# Patient Record
Sex: Male | Born: 1960 | Race: White | Hispanic: No | Marital: Single | Smoking: Never smoker
Health system: Southern US, Community
[De-identification: ages and names within clinical notes are randomized; demographics above are authoritative.]

## PROBLEM LIST (undated history)

## (undated) DIAGNOSIS — E785 Hyperlipidemia, unspecified: Secondary | ICD-10-CM

## (undated) DIAGNOSIS — M1990 Unspecified osteoarthritis, unspecified site: Secondary | ICD-10-CM

## (undated) DIAGNOSIS — R112 Nausea with vomiting, unspecified: Secondary | ICD-10-CM

## (undated) DIAGNOSIS — R609 Edema, unspecified: Secondary | ICD-10-CM

## (undated) DIAGNOSIS — T8859XA Other complications of anesthesia, initial encounter: Secondary | ICD-10-CM

## (undated) DIAGNOSIS — T4145XA Adverse effect of unspecified anesthetic, initial encounter: Secondary | ICD-10-CM

## (undated) DIAGNOSIS — E119 Type 2 diabetes mellitus without complications: Secondary | ICD-10-CM

## (undated) DIAGNOSIS — D649 Anemia, unspecified: Secondary | ICD-10-CM

## (undated) DIAGNOSIS — I10 Essential (primary) hypertension: Secondary | ICD-10-CM

## (undated) DIAGNOSIS — Z9889 Other specified postprocedural states: Secondary | ICD-10-CM

## (undated) DIAGNOSIS — K219 Gastro-esophageal reflux disease without esophagitis: Secondary | ICD-10-CM

## (undated) DIAGNOSIS — N189 Chronic kidney disease, unspecified: Secondary | ICD-10-CM

## (undated) DIAGNOSIS — Z8719 Personal history of other diseases of the digestive system: Secondary | ICD-10-CM

## (undated) HISTORY — PX: ACHILLES TENDON REPAIR: SUR1153

## (undated) HISTORY — PX: NECK SURGERY: SHX720

## (undated) HISTORY — PX: CHOLECYSTECTOMY: SHX55

---

## 2005-09-03 ENCOUNTER — Other Ambulatory Visit: Payer: Self-pay

## 2005-09-14 ENCOUNTER — Ambulatory Visit: Payer: Self-pay | Admitting: Podiatry

## 2008-10-30 ENCOUNTER — Ambulatory Visit: Payer: Self-pay | Admitting: Podiatry

## 2008-11-22 ENCOUNTER — Encounter: Payer: Self-pay | Admitting: Internal Medicine

## 2008-12-10 ENCOUNTER — Encounter: Payer: Self-pay | Admitting: Internal Medicine

## 2009-01-07 ENCOUNTER — Encounter: Payer: Self-pay | Admitting: Internal Medicine

## 2009-02-07 ENCOUNTER — Encounter: Payer: Self-pay | Admitting: Internal Medicine

## 2009-07-17 ENCOUNTER — Emergency Department: Payer: Self-pay | Admitting: Emergency Medicine

## 2011-02-10 ENCOUNTER — Encounter: Payer: Self-pay | Admitting: Nurse Practitioner

## 2011-02-11 ENCOUNTER — Other Ambulatory Visit: Payer: Self-pay | Admitting: Nurse Practitioner

## 2011-03-10 ENCOUNTER — Encounter: Payer: Self-pay | Admitting: Nurse Practitioner

## 2014-09-12 ENCOUNTER — Encounter: Payer: Self-pay | Admitting: Surgery

## 2014-09-17 LAB — WOUND AEROBIC CULTURE

## 2014-10-09 ENCOUNTER — Encounter: Payer: Self-pay | Admitting: Surgery

## 2014-10-21 LAB — WOUND AEROBIC CULTURE

## 2014-11-09 ENCOUNTER — Encounter: Payer: Self-pay | Admitting: Surgery

## 2014-12-02 LAB — WOUND AEROBIC CULTURE

## 2014-12-10 ENCOUNTER — Encounter: Payer: Self-pay | Admitting: Surgery

## 2015-01-08 ENCOUNTER — Encounter
Admit: 2015-01-08 | Disposition: A | Discharge status: Home / Self Care | Payer: Self-pay | Attending: Surgery | Admitting: Surgery

## 2015-06-11 ENCOUNTER — Encounter: Payer: BLUE CROSS/BLUE SHIELD | Attending: Surgery | Admitting: Surgery

## 2015-06-11 DIAGNOSIS — E1122 Type 2 diabetes mellitus with diabetic chronic kidney disease: Secondary | ICD-10-CM | POA: Insufficient documentation

## 2015-06-11 DIAGNOSIS — I83022 Varicose veins of left lower extremity with ulcer of calf: Secondary | ICD-10-CM | POA: Diagnosis not present

## 2015-06-11 DIAGNOSIS — I129 Hypertensive chronic kidney disease with stage 1 through stage 4 chronic kidney disease, or unspecified chronic kidney disease: Secondary | ICD-10-CM | POA: Insufficient documentation

## 2015-06-11 DIAGNOSIS — M1990 Unspecified osteoarthritis, unspecified site: Secondary | ICD-10-CM | POA: Diagnosis not present

## 2015-06-11 DIAGNOSIS — E11622 Type 2 diabetes mellitus with other skin ulcer: Secondary | ICD-10-CM | POA: Insufficient documentation

## 2015-06-11 DIAGNOSIS — L97222 Non-pressure chronic ulcer of left calf with fat layer exposed: Secondary | ICD-10-CM | POA: Diagnosis present

## 2015-06-11 DIAGNOSIS — G629 Polyneuropathy, unspecified: Secondary | ICD-10-CM | POA: Diagnosis not present

## 2015-06-11 DIAGNOSIS — N184 Chronic kidney disease, stage 4 (severe): Secondary | ICD-10-CM | POA: Diagnosis not present

## 2015-06-11 DIAGNOSIS — I872 Venous insufficiency (chronic) (peripheral): Secondary | ICD-10-CM | POA: Insufficient documentation

## 2015-06-13 NOTE — Progress Notes (Signed)
RYKIN, ROUTE ([id number]) Visit Report for 06/11/2015 Abuse/Suicide Risk Screen Details Patient Name: [name], [name]. Date of Service: 06/11/2015 10:30 AM Medical Record Number: [id number] Patient Account Number: [id number] Date of Birth/Sex: 03/09/1961 (54 y.o. Male) Treating RN: Cornell Barman Primary Care Physician: Frazier Richards Other Clinician: Referring Physician: Treating Physician/Extender: Frann Rider in Treatment: 0 Abuse/Suicide Risk Screen Items Answer ABUSE/SUICIDE RISK SCREEN: Has anyone close to you tried to hurt or harm you recentlyo No Do you feel uncomfortable with anyone in your familyo No Has anyone forced you do things that you didnot want to doo No Do you have any thoughts of harming yourselfo No Patient displays signs or symptoms of abuse and/or neglect. No Electronic Signature(s) Signed: 06/12/2015 4:58:31 PM By: Gretta Cool, RN, BSN, Kim RN, BSN Entered By: Gretta Cool, RN, BSN, Kim on 06/11/2015 11:04:56 [name] ([id number]) -------------------------------------------------------------------------------- Activities of Daily Living Details Patient Name: Cutting, [name] Date of Service: 06/11/2015 10:30 AM Medical Record Number: [id number] Patient Account Number: [id number] Date of Birth/Sex: 1961-11-01 (54 y.o. Male) Treating RN: Montey Hora Primary Care Physician: Frazier Richards Other Clinician: Referring Physician: Treating Physician/Extender: Frann Rider in Treatment: 0 Activities of Daily Living Items Answer Activities of Daily Living (Please select one for each item) Drive Automobile Completely Able Take Medications Completely Able Use Telephone Completely Able Care for Appearance Completely Able Use Toilet Completely Able Bath / Shower Completely Able Dress Self Completely Able Feed Self Completely Able Walk Completely Able Get In / Out Bed Completely Able Housework Completely Able Prepare Meals Completely Able Handle  Money Completely Able Shop for Self Completely Able Electronic Signature(s) Signed: 06/11/2015 5:09:34 PM By: Montey Hora Entered By: Montey Hora on 06/11/2015 11:05:32 [name], [name] ([id number]) -------------------------------------------------------------------------------- Education Assessment Details Patient Name: [name] Montana, [name] Date of Service: 06/11/2015 10:30 AM Medical Record Number: [id number] Patient Account Number: [id number] Date of Birth/Sex: 03/16/1961 (54 y.o. Male) Treating RN: Montey Hora Primary Care Physician: Frazier Richards Other Clinician: Referring Physician: Treating Physician/Extender: Frann Rider in Treatment: 0 Primary Learner Assessed: Patient Learning Preferences/Education Level/Primary Language Learning Preference: Explanation, Demonstration, Printed Material Highest Education Level: College or Above Preferred Language: English Cognitive Barrier Assessment/Beliefs Language Barrier: No Translator Needed: No Memory Deficit: No Emotional Barrier: No Cultural/Religious Beliefs Affecting Medical No Care: Physical Barrier Assessment Impaired Vision: No Impaired Hearing: No Decreased Hand dexterity: No Knowledge/Comprehension Assessment Knowledge Level: Medium Comprehension Level: Medium Ability to understand written Medium instructions: Ability to understand verbal Medium instructions: Motivation Assessment Anxiety Level: Calm Cooperation: Cooperative Education Importance: Acknowledges Need Interest in Health Problems: Asks Questions Perception: Coherent Willingness to Engage in Self- Medium Management Activities: Readiness to Engage in Self- Medium Management Activities: Electronic Signature(s) [name] ([id number]) Signed: 06/11/2015 5:09:34 PM By: Montey Hora Entered By: Montey Hora on 06/11/2015 11:05:55 [name], [name]  ([id number]) -------------------------------------------------------------------------------- Fall Risk Assessment Details Patient Name: [name], [name] Date of Service: 06/11/2015 10:30 AM Medical Record Number: [id number] Patient Account Number: [id number] Date of Birth/Sex: 1961/02/02 (54 y.o. Male) Treating RN: Montey Hora Primary Care Physician: Frazier Richards Other Clinician: Referring Physician: Treating Physician/Extender: Frann Rider in Treatment: 0 Fall Risk Assessment Items FALL RISK ASSESSMENT: History of falling - immediate or within 3 months 0 No Secondary diagnosis 0 No Ambulatory aid None/bed rest/wheelchair/nurse 0 Yes Crutches/cane/walker 0 No Furniture 0 No IV Access/Saline Lock 0 No Gait/Training Normal/bed rest/immobile 0 Yes Weak 0 No Impaired 0 No Mental Status Oriented to own ability 0 Yes Electronic Signature(s) Signed: 06/11/2015  5:09:34 PM By: Montey Hora Entered By: Montey Hora on 06/11/2015 11:06:01 [name] ([id number]) -------------------------------------------------------------------------------- Foot Assessment Details Patient Name: [name], [name] Date of Service: 06/11/2015 10:30 AM Medical Record Number: [id number] Patient Account Number: [id number] Date of Birth/Sex: Apr 08, 1961 (54 y.o. Male) Treating RN: Montey Hora Primary Care Physician: Frazier Richards Other Clinician: Referring Physician: Treating Physician/Extender: Frann Rider in Treatment: 0 Foot Assessment Items Site Locations + = Sensation present, - = Sensation absent, C = Callus, U = Ulcer R = Redness, W = Warmth, M = Maceration, PU = Pre-ulcerative lesion F = Fissure, S = Swelling, D = Dryness Assessment Right: Left: Other Deformity: No No Prior Foot Ulcer: No No Prior Amputation: No No Charcot Joint: No No Ambulatory Status: Ambulatory Without Help Gait: Steady Electronic Signature(s) Signed: 06/11/2015 5:09:34 PM By: Montey Hora Entered By: Montey Hora on 06/11/2015 11:06:51 [name], [name] ([id number]) -------------------------------------------------------------------------------- Nutrition Risk Assessment Details Patient Name: [name], [name] Date of Service: 06/11/2015 10:30 AM Medical Record Number: [id number] Patient Account Number: [id number] Date of Birth/Sex: 03-24-1961 (54 y.o. Male) Treating RN: Montey Hora Primary Care Physician: Frazier Richards Other Clinician: Referring Physician: Treating Physician/Extender: Frann Rider in Treatment: 0 Height (in): 74 Weight (lbs): 260 Body Mass Index (BMI): 33.4 Nutrition Risk Assessment Items NUTRITION RISK SCREEN: I have an illness or condition that made me change the kind and/or 0 No amount of food I eat I eat fewer than two meals per day 0 No I eat few fruits and vegetables, or milk products 0 No I have three or more drinks of beer, liquor or wine almost every day 0 No I have tooth or mouth problems that make it hard for me to eat 0 No I don't always have enough money to buy the food I need 0 No I eat alone most of the time 0 No I take three or more different prescribed or over-the-counter drugs a 1 Yes day Without wanting to, I have lost or gained 10 pounds in the last six 0 No months I am not always physically able to shop, cook and/or feed myself 0 No Nutrition Protocols Good Risk Protocol Moderate Risk Protocol Electronic Signature(s) Signed: 06/11/2015 5:09:34 PM By: Montey Hora Entered By: Montey Hora on 06/11/2015 11:06:07

## 2015-06-13 NOTE — Progress Notes (Signed)
TEDD, COTTRILL ([id number]) Visit Report for 06/11/2015 Chief Complaint Document Details Patient Name: [name], [name]. Date of Service: 06/11/2015 10:30 AM Medical Record Number: [id number] Patient Account Number: [id number] Date of Birth/Sex: 1960/12/05 (54 y.o. Male) Treating RN: Primary Care Physician: Frazier Richards Other Clinician: Referring Physician: Treating Physician/Extender: Frann Rider in Treatment: 0 Information Obtained from: Patient Chief Complaint Patient presents for treatment of an open ulcer due to venous insufficiency. He has had a recurrent left lower extremity ulceration since about 2 months Electronic Signature(s) Signed: 06/11/2015 11:45:32 AM By: Christin Fudge MD, FACS Entered By: Christin Fudge on 06/11/2015 11:45:32 Demartin, Hezzie J. ([id number]) -------------------------------------------------------------------------------- Debridement Details Patient Name: [name], [name] Date of Service: 06/11/2015 10:30 AM Medical Record Number: [id number] Patient Account Number: [id number] Date of Birth/Sex: 1961/09/18 (54 y.o. Male) Treating RN: Primary Care Physician: Frazier Richards Other Clinician: Referring Physician: Treating Physician/Extender: Frann Rider in Treatment: 0 Debridement Performed for Wound #3 Left,Medial Lower Leg Assessment: Performed By: Physician Pat Patrick., MD Debridement: Debridement Pre-procedure Yes Verification/Time Out Taken: Start Time: 11:31 Pain Control: Lidocaine 4% Topical Solution Level: Skin/Subcutaneous Tissue Total Area Debrided (L x 3.5 (cm) x 5.2 (cm) = 18.2 (cm) W): Tissue and other Viable, Non-Viable, Eschar, Fibrin/Slough, Subcutaneous material debrided: Instrument: Forceps, Scissors Bleeding: Minimum Hemostasis Achieved: Silver Nitrate End Time: 11:38 Procedural Pain: 0 Post Procedural Pain: 0 Response to Treatment: Procedure was tolerated well Post Debridement Measurements of Total  Wound Length: (cm) 3.5 Width: (cm) 5.2 Depth: (cm) 0.3 Volume: (cm) 4.288 Electronic Signature(s) Signed: 06/11/2015 11:44:53 AM By: Christin Fudge MD, FACS Entered By: Christin Fudge on 06/11/2015 11:44:52 [name], [name] ([id number]) -------------------------------------------------------------------------------- HPI Details Patient Name: [name], [name] Date of Service: 06/11/2015 10:30 AM Medical Record Number: [id number] Patient Account Number: [id number] Date of Birth/Sex: Apr 17, 1961 (54 y.o. Male) Treating RN: Primary Care Physician: Frazier Richards Other Clinician: Referring Physician: Treating Physician/Extender: Frann Rider in Treatment: 0 History of Present Illness Location: left lower extremity medial and lateral Quality: Patient reports experiencing a dull pain to affected area(s). Severity: Patient states wound are getting worse. Duration: Patient has had the wound for > 2 months prior to seeking treatment at the wound center Timing: Pain in wound is Intermittent (comes and goes Context: The wound appeared gradually over time Modifying Factors: Testing to this date include ABI, Waveforms, Venous studies Associated Signs and Symptoms: Patient reports having difficulty standing for long periods. HPI Description: he had endovenous ablation of his left lower extremity about 2 months ago and soon after that he noted that there was another ulceration on his left lower extremity. He did not seek medical help for very social reasons and now this has progressively gotten worse. 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 Last hemoglobin A1c on 12/06/2014 was 6.4 The patient is a pleasant 54 year old with a past medical history significant for diabetes, peripheral neuropathy, and chronic venous stasis disease. No history of DVT. No  peripheral vascular disease (ABI done at [facility] showed the left to be 1.30 in the right to be 1.31). Ambulating normally per his baseline. Electronic Signature(s) Signed: 06/11/2015 11:47:50 AM By: Christin Fudge MD, FACS Previous Signature: 06/11/2015 11:18:51 AM Version By: Christin Fudge MD, FACS Previous Signature: 06/11/2015 11:18:00 AM Version By: Christin Fudge MD, FACS Entered By: Christin Fudge on 06/11/2015 11:47:50 [name] ([id number]) -------------------------------------------------------------------------------- Physical Exam Details Patient Name: [name], [name]  Date of Service: 06/11/2015 10:30 AM Medical Record Number: [id number] Patient Account Number: [id number] Date of Birth/Sex: 11-23-1960 (54 y.o. Male) Treating RN: Primary Care Physician: Frazier Richards Other Clinician: Referring Physician: Treating Physician/Extender: Frann Rider in Treatment: 0 Constitutional . Pulse regular. Respirations normal and unlabored. Afebrile. . Eyes Nonicteric. Reactive to light. Ears, Nose, Mouth, and Throat Lips, teeth, and gums WNL.Marland [name] Moist mucosa without lesions . Neck supple and nontender. No palpable supraclavicular or cervical adenopathy. Normal sized without goiter. Respiratory WNL. No retractions.. Cardiovascular Pedal Pulses WNL. No clubbing, cyanosis or edema. Gastrointestinal (GI) Abdomen without masses or tenderness.. No liver or spleen enlargement or tenderness.. Musculoskeletal Adexa without tenderness or enlargement.. Digits and nails w/o clubbing, cyanosis, infection, petechiae, ischemia, or inflammatory conditions.. Integumentary (Hair, Skin) No suspicious lesions. No crepitus or fluctuance. No peri-wound warmth or erythema. No masses.Marland [name] Psychiatric Judgement and insight Intact.. No evidence of depression, anxiety, or agitation.. Notes He is a large ulceration on the medial part of his left calf which is extremely necrotic with debris and need sharp  debridement. On the lateral aspect is covered a small ulceration which has some eschar which will be sharply debrided too. Electronic Signature(s) Signed: 06/11/2015 11:48:50 AM By: Christin Fudge MD, FACS Entered By: Christin Fudge on 06/11/2015 11:48:50 Loge, [name] [name] ([id number]) -------------------------------------------------------------------------------- Physician Orders Details Patient Name: [name], [name] Date of Service: 06/11/2015 10:30 AM Medical Record Number: [id number] Patient Account Number: [id number] Date of Birth/Sex: 1961/01/30 (54 y.o. Male) Treating RN: Montey Hora Primary Care Physician: Frazier Richards Other Clinician: Referring Physician: Treating Physician/Extender: Frann Rider in Treatment: 0 Verbal / Phone Orders: Yes Clinician: Montey Hora Read Back and Verified: Yes Diagnosis Coding Wound Cleansing Wound #3 Left,Medial Lower Leg o Cleanse wound with mild soap and water Wound #4 Left,Lateral Lower Leg o Cleanse wound with mild soap and water Anesthetic Wound #3 Left,Medial Lower Leg o Topical Lidocaine 4% cream applied to wound bed prior to debridement Wound #4 Left,Lateral Lower Leg o Topical Lidocaine 4% cream applied to wound bed prior to debridement Primary Wound Dressing Wound #3 Left,Medial Lower Leg o Santyl Ointment Wound #4 Left,Lateral Lower Leg o Santyl Ointment Secondary Dressing Wound #3 Left,Medial Lower Leg o ABD pad Wound #4 Left,Lateral Lower Leg o ABD pad Dressing Change Frequency Wound #3 Left,Medial Lower Leg o Other: - twice weekly - Tuesday and Friday Wound #4 Left,Lateral Lower Leg o Other: - twice weekly - Tuesday and Friday Follow-up Appointments Bergin, Dexter J. ([id number]) Wound #3 Left,Medial Lower Leg o Return Appointment in 1 week. o Nurse Visit as needed - Friday for rewrap Wound #4 Left,Lateral Lower Leg o Return Appointment in 1 week. o Nurse Visit as needed -  Friday for rewrap Edema Control Wound #3 Left,Medial Lower Leg o Unna Boot to Left Lower Extremity Wound #4 Left,Lateral Lower Leg o Unna Boot to Left Lower Extremity Medications-please add to medication list. Wound #3 Left,Medial Lower Leg o Santyl Enzymatic Ointment Wound #4 Left,Lateral Lower Leg o Santyl Enzymatic Ointment Electronic Signature(s) Signed: 06/11/2015 12:09:58 PM By: Christin Fudge MD, FACS Signed: 06/11/2015 5:09:34 PM By: Montey Hora Entered By: Montey Hora on 06/11/2015 11:37:21 [name], [name] ([id number]) -------------------------------------------------------------------------------- Problem List Details Patient Name: [name], [name] Date of Service: 06/11/2015 10:30 AM Medical Record Number: [id number] Patient Account Number: [id number] Date of Birth/Sex: [birth year] (54 y.o. 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 ([id number]) -------------------------------------------------------------------------------- Progress Note Details Patient Name: [name], [name] Date of Service: 06/11/2015 10:30 AM Medical Record Number: [id number] Patient Account Number: [id number] Date of Birth/Sex: 1961/07/31 (54 y.o. Male) Treating RN: Primary Care Physician: Frazier Richards Other  Clinician: Referring Physician: Treating Physician/Extender: Frann Rider in Treatment: 0 Subjective Chief Complaint Information obtained from Patient Patient presents for treatment of an open ulcer due to venous insufficiency. He has had a recurrent left lower extremity ulceration since about 2 months History of Present Illness (HPI) The following HPI elements were documented for the patient's wound: Location: left lower extremity medial and lateral Quality: Patient reports experiencing a dull pain to affected area(s). Severity: Patient states wound are getting worse. Duration: Patient has had the wound for > 2 months prior to seeking treatment at the wound center Timing: Pain in wound is Intermittent (comes and goes Context: The wound appeared gradually over time Modifying Factors: Testing to this date include ABI, Waveforms, Venous studies Associated Signs and Symptoms: Patient reports having difficulty standing for long periods. he had endovenous ablation of his left lower extremity about 2 months ago and soon after that he noted that there was another ulceration on his left lower extremity. He did not seek medical help for very social reasons and now this has progressively gotten worse. 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 Last hemoglobin A1c on 12/06/2014 was 6.4 The patient is a pleasant 54 year old with a past medical history significant for diabetes, peripheral neuropathy, and chronic venous stasis disease. No history of DVT. No peripheral vascular disease (ABI done at [facility] showed the left to be 1.30 in the right to be 1.31). Ambulating normally per his baseline. Wound History Patient presents with 2 open wounds that have been present for approximately 2 months. Patient has been treating wounds in the following manner: no. Laboratory  tests have not been performed in the last month. Patient reportedly has not tested positive for an antibiotic resistant organism. Patient reportedly has not tested positive for osteomyelitis. Patient reportedly has had testing performed to evaluate circulation in the legs. Patient experiences the following problems associated with their wounds: swelling. Patient History Information obtained from Patient. Frasier, Kacey [name] Sciara ([id number]) Allergies No Known Allergies Family History Cancer - Siblings, Diabetes - Mother, Siblings, Hypertension - Mother, Siblings, Stroke - Mother, No family history of Heart Disease, Hereditary Spherocytosis, Kidney Disease, Lung Disease, Seizures, Thyroid Problems, Tuberculosis. Social History Never smoker, Marital Status - Single, Alcohol Use - Rarely, Drug Use - No History, Caffeine Use - Moderate. Medical And Surgical History Notes Musculoskeletal Chronic back pain Review of Systems (ROS) Constitutional Symptoms (General Health) The patient has no complaints or symptoms. Eyes The patient has no complaints or symptoms. Ear/Nose/Mouth/Throat The patient has no complaints or symptoms. Hematologic/Lymphatic The patient has no complaints or symptoms. Respiratory The patient has no complaints or symptoms. Gastrointestinal The patient has no complaints or symptoms. Genitourinary The patient has no complaints or symptoms. Immunological The patient has  no complaints or symptoms. Integumentary (Skin) The patient has no complaints or symptoms. Oncologic The patient has no complaints or symptoms. Psychiatric The patient has no complaints or symptoms. Medications: I have reviewed his present medications which include Amaryl, Januvia, ferrous sulfate, Coreg, Prilosec. Fandino, Mariel [name] [name] ([id number]) Objective Constitutional Pulse regular. Respirations normal and unlabored. Afebrile. Vitals Time Taken: 10:46 AM, Height: 74 in, Source: Stated, Weight: 260  lbs, Source: Stated, BMI: 33.4, Temperature: 98.2 F, Pulse: 88 bpm, Respiratory Rate: 18 breaths/min, Blood Pressure: 180/88 mmHg. Eyes Nonicteric. Reactive to light. Ears, Nose, Mouth, and Throat Lips, teeth, and gums WNL.Marland [name] Moist mucosa without lesions . Neck supple and nontender. No palpable supraclavicular or cervical adenopathy. Normal sized without goiter. Respiratory WNL. No retractions.. Cardiovascular Pedal Pulses WNL. No clubbing, cyanosis or edema. Gastrointestinal (GI) Abdomen without masses or tenderness.. No liver or spleen enlargement or tenderness.. Musculoskeletal Adexa without tenderness or enlargement.. Digits and nails w/o clubbing, cyanosis, infection, petechiae, ischemia, or inflammatory conditions.Marland [name] Psychiatric Judgement and insight Intact.. No evidence of depression, anxiety, or agitation.. General Notes: He is a large ulceration on the medial part of his left calf which is extremely necrotic with debris and need sharp debridement. On the lateral aspect is covered a small ulceration which has some eschar which will be sharply debrided too. Integumentary (Hair, Skin) No suspicious lesions. No crepitus or fluctuance. No peri-wound warmth or erythema. No masses.. Wound #3 status is Open. Original cause of wound was Gradually Appeared. The wound is located on the Left,Medial Lower Leg. The wound measures 3.5cm length x 5.2cm width x 0.3cm depth; 14.294cm^2 area and 4.288cm^3 volume. The wound is limited to skin breakdown. There is a medium amount of serous drainage noted. The wound margin is flat and intact. There is no granulation within the wound bed. There is a large (67-100%) amount of necrotic tissue within the wound bed including Adherent Slough. The [name], [name] ([id number]) periwound skin appearance exhibited: Moist. The periwound skin appearance did not exhibit: Callus, Crepitus, Excoriation, Fluctuance, Friable, Induration, Localized Edema, Rash,  Scarring, Dry/Scaly, Maceration, Atrophie Blanche, Cyanosis, Ecchymosis, Hemosiderin Staining, Mottled, Pallor, Rubor, Erythema. Wound #4 status is Open. Original cause of wound was Gradually Appeared. The wound is located on the Left,Lateral Lower Leg. The wound measures 0.5cm length x 0.4cm width x 0.1cm depth; 0.157cm^2 area and 0.016cm^3 volume. The wound is limited to skin breakdown. There is a none present amount of drainage noted. The wound margin is indistinct and nonvisible. There is no granulation within the wound bed. There is a large (67-100%) amount of necrotic tissue within the wound bed including Eschar. The periwound skin appearance exhibited: Dry/Scaly. The periwound skin appearance did not exhibit: Callus, Crepitus, Excoriation, Fluctuance, Friable, Induration, Localized Edema, Rash, Scarring, Maceration, Moist, Atrophie Blanche, Cyanosis, Ecchymosis, Hemosiderin Staining, Mottled, Pallor, Rubor, Erythema. 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 In view of this I have called in for twice weekly dressing changes with Santyl and Unna's boots. in the big picture once this has healthy granulation tissue I believe we should use Apligraf and I have discussed this with him and he is agreeable as long as her insurance  covers this. He will be seen Tuesdays and Fridays until the ulceration gets [name] and we can go down to weekly Unna's boots Procedures Wound #3 Wound #3 is a Diabetic Wound/Ulcer of the Lower Extremity located on the Left,Medial Lower Leg . There was a Skin/Subcutaneous Tissue Debridement ([id number])  debridement with total area of 18.2 sq cm performed by Oriel Ojo, Jackson Latino., MD. with the following instrument(s): Forceps and Scissors to remove Viable and Non-Viable tissue/material including Fibrin/Slough, Eschar, and Subcutaneous after achieving pain control using Lidocaine 4% Topical Solution. A time out was conducted prior to the start of the procedure. A Minimum amount of bleeding was controlled with Silver Nitrate. The procedure was tolerated well with a Micucci, Khaden J. ([id number]) pain level of 0 throughout and a pain level of 0 following the procedure. Post Debridement Measurements: 3.5cm length x 5.2cm width x 0.3cm depth; 4.288cm^3 volume. Plan Wound Cleansing: Wound #3 Left,Medial Lower Leg: Cleanse wound with mild soap and water Wound #4 Left,Lateral Lower Leg: Cleanse wound with mild soap and water Anesthetic: Wound #3 Left,Medial Lower Leg: Topical Lidocaine 4% cream applied to wound bed prior to debridement Wound #4 Left,Lateral Lower Leg: Topical Lidocaine 4% cream applied to wound bed prior to debridement Primary Wound Dressing: Wound #3 Left,Medial Lower Leg: Santyl Ointment Wound #4 Left,Lateral Lower Leg: Santyl Ointment Secondary Dressing: Wound #3 Left,Medial Lower Leg: ABD pad Wound #4 Left,Lateral Lower Leg: ABD pad Dressing Change Frequency: Wound #3 Left,Medial Lower Leg: Other: - twice weekly - Tuesday and Friday Wound #4 Left,Lateral Lower Leg: Other: - twice weekly - Tuesday and Friday Follow-up Appointments: Wound #3 Left,Medial Lower Leg: Return Appointment in 1 week. Nurse Visit as needed - Friday for rewrap Wound #4 Left,Lateral Lower Leg: Return Appointment in 1 week. Nurse Visit as needed - Friday for rewrap Edema Control: Wound #3 Left,Medial Lower Leg: Unna Boot to Left Lower Extremity Wound #4 Left,Lateral Lower Leg: Unna Boot to Left Lower Extremity Medications-please add to medication list.: Wound #3 Left,Medial Lower Leg: Santyl  Enzymatic Ointment Wound #4 Left,Lateral Lower Leg: Pistole, [name] Sciara ([id number]) Santyl Enzymatic Ointment The patient has a lot of necrotic debris over his ulcerations and this would require some Santyl to be applied to his wounds. In view of this I have called in for twice weekly dressing changes with Santyl and Unna's boots. in the big picture once this has healthy granulation tissue I believe we should use Apligraf and I have discussed this with him and he is agreeable as long as her insurance covers this. He will be seen Tuesdays and Fridays until the ulceration gets [name] and we can go down to weekly Unna's boots Electronic Signature(s) Signed: 06/11/2015 11:52:03 AM By: Christin Fudge MD, FACS Entered By: Christin Fudge on 06/11/2015 11:52:03 Margerum, Bralyn J. ([id number]) -------------------------------------------------------------------------------- ROS/PFSH Details Patient Name: [name], Emad J. Date of Service: 06/11/2015 10:30 AM Medical Record Number: [id number] Patient Account Number: [id number] Date of Birth/Sex: [birth year] (54 y.o. Male) Treating RN: Cornell Barman Primary Care Physician: Frazier Richards Other Clinician: Referring Physician: Treating Physician/Extender: Frann Rider in Treatment: 0 Information Obtained From Patient Wound History Do you currently have one or more open woundso Yes How many open wounds do you currently haveo 2 Approximately how long have you had your woundso 2 months How have you been treating your wound(s) until nowo no Has your wound(s) ever healed and then re-openedo No Have you had any lab work done in the past montho No Have you tested positive  for an antibiotic resistant organism (MRSA, VRE)o No Have you tested positive for osteomyelitis (bone infection)o No Have you had any tests for circulation on your legso Yes Who ordered the testo [facility] Where was the test doneo [facility] Have you had other problems associated with your  woundso Swelling Constitutional Symptoms (General Health) Complaints and Symptoms: No Complaints or Symptoms Eyes Complaints and Symptoms: No Complaints or Symptoms Ear/Nose/Mouth/Throat Complaints and Symptoms: No Complaints or Symptoms Hematologic/Lymphatic Complaints and Symptoms: No Complaints or Symptoms Respiratory Complaints and Symptoms: No Complaints or Symptoms Cardiovascular Lurz, Holten J. ([id number]) Medical History: Positive for: Hypertension - currently untreated and uncontrolled Gastrointestinal Complaints and Symptoms: No Complaints or Symptoms Endocrine Medical History: Positive for: Type II Diabetes - currently untreated and does not check blood sugars Time with diabetes: 10 years Blood sugar tested every day: No Genitourinary Complaints and Symptoms: No Complaints or Symptoms Immunological Complaints and Symptoms: No Complaints or Symptoms Integumentary (Skin) Complaints and Symptoms: No Complaints or Symptoms Musculoskeletal Medical History: Positive for: Osteoarthritis Past Medical History Notes: Chronic back pain Neurologic Medical History: Positive for: Neuropathy Oncologic Complaints and Symptoms: No Complaints or Symptoms Psychiatric Complaints and Symptoms: No Complaints or Symptoms Busbin, Carnelius J. ([id number]) Family and Social History Cancer: Yes - Siblings; Diabetes: Yes - Mother, Siblings; Heart Disease: No; Hereditary Spherocytosis: No; Hypertension: Yes - Mother, Siblings; Kidney Disease: No; Lung Disease: No; Seizures: No; Stroke: Yes - Mother; Thyroid Problems: No; Tuberculosis: No; Never smoker; Marital Status - Single; Alcohol Use: Rarely; Drug Use: No History; Caffeine Use: Moderate; Financial Concerns: No; Food, Clothing or Shelter Needs: No; Support System Lacking: No; Transportation Concerns: No; Advanced Directives: No; Patient does not want information on Advanced Directives; Do not resuscitate: No; Living Will:  No; Medical Power of Attorney: No Physician Affirmation I have reviewed and agree with the above information. Electronic Signature(s) Signed: 06/11/2015 11:24:15 AM By: Christin Fudge MD, FACS Signed: 06/12/2015 4:58:31 PM By: Gretta Cool RN, BSN, Kim RN, BSN Entered By: Christin Fudge on 06/11/2015 11:24:15 Spillman, Nieko [name] [name] ([id number]) -------------------------------------------------------------------------------- [place] Details Patient Name: CARNEIRO, Aniello J. Date of Service: 06/11/2015 Medical Record Number: [id number] Patient Account Number: [id number] Date of Birth/Sex: 1961/07/26 (54 y.o. 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 I87.2 Venous insufficiency (chronic) (peripheral) L97.222 Non-pressure chronic ulcer of left calf with fat layer exposed Facility Procedures CPT4 Code: 62952841 Description: 99213 - WOUND CARE VISIT-LEV 3 EST PT Modifier: Quantity: 1 CPT4 Code: 32440102 Description: 72536 - DEB SUBQ TISSUE 20 SQ CM/< ICD-10 Description Diagnosis E11.622 Type 2 diabetes mellitus 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 o I87.2 Venous insufficiency (chronic) (peripheral) Modifier: f calf Quantity: 1 Physician Procedures CPT4 Code: 6440347 Description: 42595 - WC PHYS LEVEL 4 - EST PT ICD-10 Description Diagnosis E11.622 Type 2 diabetes mellitus 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 ([id number]) Modifier: of calf Quantity: 1 Electronic Signature(s) Signed: 06/11/2015 11:52:27 AM  By: Christin Fudge MD, FACS Entered By: Christin Fudge on 06/11/2015 11:52:27

## 2015-06-13 NOTE — Progress Notes (Signed)
SUEDE, GREENAWALT ([id number]) Visit Report for 06/11/2015 Allergy List Details Patient Name: [name], [name]. Date of Service: 06/11/2015 10:30 AM Medical Record Number: [id number] Patient Account Number: [id number] Date of Birth/Sex: 06-19-1961 (54 y.o. Male) Treating RN: Cornell Barman Primary Care Physician: Frazier Richards Other Clinician: Referring Physician: Treating Physician/Extender: Frann Rider in Treatment: 0 Allergies Active Allergies No Known Allergies Allergy Notes Electronic Signature(s) Signed: 06/12/2015 4:58:31 PM By: Gretta Cool, RN, BSN, Kim RN, BSN Entered By: Gretta Cool, RN, BSN, Kim on 06/11/2015 11:02:46 [name], [name] ([id number]) -------------------------------------------------------------------------------- Arrival Information Details Patient Name: [name]. Date of Service: 06/11/2015 10:30 AM Medical Record Number: [id number] Patient Account Number: [id number] Date of Birth/Sex: 1961-04-02 (54 y.o. Male) Treating RN: Cornell Barman Primary Care Physician: Frazier Richards Other Clinician: Referring Physician: Treating Physician/Extender: Frann Rider in Treatment: 0 Visit Information Patient Arrived: Ambulatory Arrival Time: 10:46 Accompanied By: self Transfer Assistance: None Patient Identification Verified: Yes Secondary Verification Process Yes Completed: Patient Has Alerts: Yes Patient Alerts: Type II Diabetic ABI AVVS L 1.30 R 1.31 History Since Last Visit Electronic Signature(s) Signed: 06/11/2015 11:20:59 AM By: Montey Hora Entered By: Montey Hora on 06/11/2015 11:20:58 Lynde, Gaelan [name]. ([id number]) -------------------------------------------------------------------------------- Clinic Level of Care Assessment Details Patient Name: [name], [name]. Date of Service: 06/11/2015 10:30 AM Medical Record Number: [id number] Patient Account Number: [id number] Date of Birth/Sex: 06/19/1961 (54 y.o. Male) Treating RN: Montey Hora Primary Care Physician: Frazier Richards Other Clinician: Referring Physician: Treating Physician/Extender: Frann Rider in Treatment: 0 Clinic Level of Care Assessment Items TOOL 1 Quantity Score []  - Use when EandM and Procedure is performed on INITIAL visit 0 ASSESSMENTS - Nursing Assessment / Reassessment X - General Physical Exam (combine w/ comprehensive assessment (listed just 1 20 below) when performed on new pt. evals) X - Comprehensive Assessment (HX, ROS, Risk Assessments, Wounds Hx, etc.) 1 25 ASSESSMENTS - Wound and Skin Assessment / Reassessment []  - Dermatologic / Skin Assessment (not related to wound area) 0 ASSESSMENTS - Ostomy and/or Continence Assessment and Care []  - Incontinence Assessment and Management 0 []  - Ostomy Care Assessment and Management (repouching, etc.) 0 PROCESS - Coordination of Care X - Simple Patient / Family Education for ongoing care 1 15 []  - Complex (extensive) Patient / Family Education for ongoing care 0 X - Staff obtains Programmer, systems, Records, Test Results / Process Orders 1 10 []  - Staff telephones HHA, Nursing Homes / Clarify orders / etc 0 []  - Routine Transfer to another Facility (non-emergent condition) 0 []  - Routine Hospital Admission (non-emergent condition) 0 X - New Admissions / Biomedical engineer / Ordering NPWT, Apligraf, etc. 1 15 []  - Emergency Hospital Admission (emergent condition) 0 PROCESS - Special Needs []  - Pediatric / Minor Patient Management 0 []  - Isolation Patient Management 0 Palmisano, Rody [name]. ([id number]) []  - Hearing / Language / Visual special needs 0 []  - Assessment of Community assistance (transportation, D/C planning, etc.) 0 []  - Additional assistance / Altered mentation 0 []  - Support Surface(s) Assessment (bed, cushion, seat, etc.) 0 INTERVENTIONS - Miscellaneous []  - External ear exam 0 []  - Patient Transfer (multiple staff / Civil Service fast streamer / Similar devices) 0 []  - Simple Staple /  Suture removal (25 or less) 0 []  - Complex Staple / Suture removal (26 or more) 0 []  - Hypo/Hyperglycemic Management (do not check if billed separately) 0 []  - Ankle / Brachial Index (ABI) - do not check if billed separately 0 Has the patient been seen at  the hospital within the last three years: Yes Total Score: 85 Level Of Care: New/Established - Level 3 Electronic Signature(s) Signed: 06/11/2015 5:09:34 PM By: Montey Hora Entered By: Montey Hora on 06/11/2015 11:29:26 Poli, [name]. ([id number]) -------------------------------------------------------------------------------- Encounter Discharge Information Details Patient Name: [name], [name]. Date of Service: 06/11/2015 10:30 AM Medical Record Number: [id number] Patient Account Number: [id number] Date of Birth/Sex: 1961-08-10 (54 y.o. Male) Treating RN: Montey Hora Primary Care Physician: Frazier Richards Other Clinician: Referring Physician: Treating Physician/Extender: Frann Rider in Treatment: 0 Encounter Discharge Information Items Discharge Pain Level: 0 Discharge Condition: Stable Ambulatory Status: Ambulatory Discharge Destination: Home Transportation: Private Auto Accompanied By: self Schedule Follow-up Appointment: Yes Medication Reconciliation completed No and provided to Patient/Care Trevious Rampey: Patient Clinical Summary of Care: Declined Electronic Signature(s) Signed: 06/11/2015 12:00:26 PM By: Ruthine Dose Entered By: Ruthine Dose on 06/11/2015 12:00:26 [name], [name]. ([id number]) -------------------------------------------------------------------------------- Lower Extremity Assessment Details Patient Name: [name], [name]. Date of Service: 06/11/2015 10:30 AM Medical Record Number: [id number] Patient Account Number: [id number] Date of Birth/Sex: 09/29/1961 (54 y.o. Male) Treating RN: Cornell Barman Primary Care Physician: Frazier Richards Other Clinician: Referring Physician: Treating  Physician/Extender: Frann Rider in Treatment: 0 Edema Assessment Assessed: [Left: No] [Right: No] E[Left: dema] [Right: :] Calf Left: Right: Point of Measurement: 34 cm From Medial Instep 40 cm cm Ankle Left: Right: Point of Measurement: 10 cm From Medial Instep 26.6 cm cm Vascular Assessment Pulses: Posterior Tibial Palpable: [Left:Yes] Doppler: [Left:Multiphasic] Dorsalis Pedis Palpable: [Left:Yes] Doppler: [Left:Monophasic] Extremity colors, hair growth, and conditions: Extremity Color: [Left:Normal] Hair Growth on Extremity: [Left:Yes] Temperature of Extremity: [Left:Warm] Capillary Refill: [Left:< 3 seconds] Toe Nail Assessment Left: Right: Thick: No Discolored: No Deformed: No Improper Length and Hygiene: No Notes Patient non-compressible >220. JEDIAH, HORGER ([id number]) Electronic Signature(s) Signed: 06/12/2015 4:58:31 PM By: Gretta Cool, RN, BSN, Kim RN, BSN Entered By: Gretta Cool, RN, BSN, Kim on 06/11/2015 11:00:26 Burkhammer, Mattheus Lenna [name] ([id number]) -------------------------------------------------------------------------------- Multi Wound Chart Details Patient Name: [name], [name]. Date of Service: 06/11/2015 10:30 AM Medical Record Number: [id number] Patient Account Number: [id number] Date of Birth/Sex: [birth year] (54 y.o. Male) Treating RN: Montey Hora Primary Care Physician: Frazier Richards Other Clinician: Referring Physician: Treating Physician/Extender: Frann Rider in Treatment: 0 Vital Signs Height(in): 74 Pulse(bpm): 88 Weight(lbs): 260 Blood Pressure 180/88 (mmHg): Body Mass Index(BMI): 33 Temperature(F): 98.2 Respiratory Rate 18 (breaths/min): Photos: [3:No Photos] [4:No Photos] [N/A:N/A] Wound Location: [3:Left Lower Leg - Medial] [4:Left Lower Leg - Lateral] [N/A:N/A] Wounding Event: [3:Gradually Appeared] [4:Gradually Appeared] [N/A:N/A] Primary Etiology: [3:Diabetic Wound/Ulcer of the Lower Extremity] [4:Diabetic  Wound/Ulcer of the Lower Extremity] [N/A:N/A] 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] [4:Venous Leg Ulcer] [N/A:N/A] Comorbid History: [3:Hypertension, Type II Diabetes, Osteoarthritis, Neuropathy] [4:Hypertension, Type II Diabetes, Osteoarthritis, Neuropathy] [N/A:N/A] Date Acquired: [3:04/11/2015] [4:04/11/2015] [N/A:N/A] Weeks of Treatment: [3:0] [4:0] [N/A:N/A] Wound Status: [3:Open] [4:Open] [N/A:N/A] Measurements L x W x D 3.5x5.2x0.3 [4:0.5x0.4x0.1] [N/A:N/A] (cm) Area (cm) : [3:14.294] [4:0.157] [N/A:N/A] Volume (cm) : [3:4.288] [4:0.016] [N/A:N/A] % Reduction in Area: [3:0.00%] [4:0.00%] [N/A:N/A] % Reduction in Volume: 0.00% [4:0.00%] [N/A:N/A] Classification: [3:Grade 2] [4:Unable to visualize wound N/A bed] Exudate Amount: [3:Medium] [4:None Present] [N/A:N/A] Exudate Type: [3:Serous] [4:N/A] [N/A:N/A] Exudate Color: [3:amber] [4:N/A] [N/A:N/A] Wound Margin: [3:Flat and Intact] [4:Indistinct, nonvisible] [N/A:N/A] Granulation Amount: [3:None Present (0%)] [4:None Present (0%)] [N/A:N/A] Necrotic Amount: [3:Large (67-100%)] [4:Large (67-100%)] [N/A:N/A] Necrotic Tissue: [3:Adherent Slough] [4:Eschar] [N/A:N/A] Exposed Structures: [3:Fascia: No Fat: No Tendon: No] [4:Fascia: No Fat: No Tendon: No] [N/A:N/A] Muscle: No Muscle: No Joint: No  Joint: No Bone: No Bone: No Limited to Skin Limited to Skin Breakdown Breakdown Periwound Skin Texture: Edema: No Edema: No N/A Excoriation: No Excoriation: No Induration: No Induration: No Callus: No Callus: No Crepitus: No Crepitus: No Fluctuance: No Fluctuance: No Friable: No Friable: No Rash: No Rash: No Scarring: No Scarring: No Periwound Skin Moist: Yes Dry/Scaly: Yes N/A Moisture: Maceration: No Maceration: No Dry/Scaly: No Moist: No Periwound Skin Color: Atrophie Blanche: No Atrophie Blanche: No N/A Cyanosis: No Cyanosis: No Ecchymosis: No Ecchymosis: No Erythema: No Erythema: No Hemosiderin Staining:  No Hemosiderin Staining: No Mottled: No Mottled: No Pallor: No Pallor: No Rubor: No Rubor: No Tenderness on No No N/A Palpation: Wound Preparation: Ulcer Cleansing: Ulcer Cleansing: N/A Rinsed/Irrigated with Rinsed/Irrigated with Saline Saline Topical Anesthetic Topical Anesthetic Applied: Other: lidocaine Applied: Other: lidocaine 4% 4% Treatment Notes Electronic Signature(s) Signed: 06/11/2015 5:09:34 PM By: Montey Hora Entered By: Montey Hora on 06/11/2015 11:27:40 [name], [name]. ([id number]) -------------------------------------------------------------------------------- Multi-Disciplinary Care Plan Details Patient Name: [name], [name]. Date of Service: 06/11/2015 10:30 AM Medical Record Number: [id number] Patient Account Number: [id number] Date of Birth/Sex: Feb 14, 1961 (54 y.o. Male) Treating RN: Montey Hora Primary Care Physician: Frazier Richards Other Clinician: Referring Physician: Treating Physician/Extender: Frann Rider in Treatment: 0 Active Inactive Venous Leg Ulcer Nursing Diagnoses: Actual venous Insuffiency (use after diagnosis is confirmed) Goals: Patient will maintain optimal edema control Date Initiated: 06/11/2015 Goal Status: Active Patient/caregiver will verbalize understanding of disease process and disease management Date Initiated: 06/11/2015 Goal Status: Active Interventions: Assess peripheral edema status every visit. Compression as ordered Notes: Wound/Skin Impairment Nursing Diagnoses: Impaired tissue integrity Goals: Ulcer/skin breakdown will have a volume reduction of 30% by week 4 Date Initiated: 06/11/2015 Goal Status: Active Ulcer/skin breakdown will have a volume reduction of 50% by week 8 Date Initiated: 06/11/2015 Goal Status: Active Ulcer/skin breakdown will have a volume reduction of 80% by week 12 Date Initiated: 06/11/2015 Goal Status: Active Ulcer/skin breakdown will heal within 14 weeks Date Initiated:  06/11/2015 [name], [name] Congress ([id number]) Goal Status: Active Interventions: Assess patient/caregiver ability to perform ulcer/skin care regimen upon admission and as needed Assess ulceration(s) every visit Notes: Electronic Signature(s) Signed: 06/11/2015 5:09:34 PM By: Montey Hora Entered By: Montey Hora on 06/11/2015 11:27:27 Odaniel, Lional [name]. ([id number]) -------------------------------------------------------------------------------- Pain Assessment Details Patient Name: Meineke, Shadrick [name]. Date of Service: 06/11/2015 10:30 AM Medical Record Number: [id number] Patient Account Number: [id number] Date of Birth/Sex: [birth year] (54 y.o. Male) Treating RN: Cornell Barman Primary Care Physician: Frazier Richards Other Clinician: Referring Physician: Treating Physician/Extender: Frann Rider in Treatment: 0 Active Problems Location of Pain Severity and Description of Pain Patient Has Paino Yes Site Locations Pain Location: Pain in Ulcers With Dressing Change: Yes Duration of the Pain. Constant / Intermittento Intermittent Rate the pain. Current Pain Level: 5 Worst Pain Level: 7 Character of Pain Describe the Pain: Tender Pain Management and Medication Current Pain Management: Medication: No Cold Application: No Rest: No Massage: No Activity: No T.E.N.S.: No Heat Application: No Leg drop or elevation: No Is the Current Pain Management Inadequate Adequate: How does your pain impact your activities of daily livingo Sleep: No Bathing: No Appetite: No Relationship With Others: No Bladder Continence: No Emotions: No Bowel Continence: No Work: No Toileting: No Drive: No Dressing: No Hobbies: No Notes Compression stockings help with swelling and pain BLADIMIR, AUMAN ([id number]) Electronic Signature(s) Signed: 06/12/2015 4:58:31 PM By: Gretta Cool, RN, BSN, Kim RN, BSN Entered By: Gretta Cool, RN, BSN, Kim on 06/11/2015 10:48:35 Force,  Deja Lenna [name]  ([id number]) -------------------------------------------------------------------------------- Patient/Caregiver Education Details Patient Name: Wyly, Kennie [name]. Date of Service: 06/11/2015 10:30 AM Medical Record Number: [id number] Patient Account Number: [id number] Date of Birth/Gender: 12/02/1960 (54 y.o. Male) Treating RN: Montey Hora Primary Care Physician: Frazier Richards Other Clinician: Referring Physician: Treating Physician/Extender: Frann Rider in Treatment: 0 Education Assessment Education Provided To: Patient Education Topics Provided Venous: Handouts: Other: need for compression hose every day Methods: Explain/Verbal Responses: State content correctly Electronic Signature(s) Signed: 06/11/2015 5:09:34 PM By: Montey Hora Entered By: Montey Hora on 06/11/2015 11:28:55 Mort, Sidi [name]. ([id number]) -------------------------------------------------------------------------------- Wound Assessment Details Patient Name: Schweiger, Gladys [name]. Date of Service: 06/11/2015 10:30 AM Medical Record Number: [id number] Patient Account Number: [id number] Date of Birth/Sex: 04/19/1961 (53 y.o. Male) Treating RN: Cornell Barman Primary Care Physician: Frazier Richards Other Clinician: Referring Physician: Treating Physician/Extender: Frann Rider in Treatment: 0 Wound Status Wound Number: 3 Primary Diabetic Wound/Ulcer of the Lower Etiology: Extremity Wound Location: Left Lower Leg - Medial Secondary Venous Leg Ulcer Wounding Event: Gradually Appeared Etiology: Date Acquired: 04/11/2015 Wound Status: Open Weeks Of Treatment: 0 Comorbid Hypertension, Type II Diabetes, Clustered Wound: No History: Osteoarthritis, Neuropathy Photos Photo Uploaded By: Gretta Cool, RN, BSN, Kim on 06/11/2015 11:47:41 Wound Measurements Length: (cm) 3.5 Width: (cm) 5.2 Depth: (cm) 0.3 Area: (cm) 14.294 Volume: (cm) 4.288 % Reduction in Area: 0% % Reduction in Volume: 0% Wound  Description Classification: Grade 2 Wound Margin: Flat and Intact Exudate Amount: Medium Exudate Type: Serous Exudate Color: amber Wound Bed Granulation Amount: None Present (0%) Exposed Structure Necrotic Amount: Large (67-100%) Fascia Exposed: No Necrotic Quality: Adherent Slough Fat Layer Exposed: No Tendon Exposed: No Luckenbaugh, Vardaan [name]. ([id number]) Muscle Exposed: No Joint Exposed: No Bone Exposed: No Limited to Skin Breakdown Periwound Skin Texture Texture Color No Abnormalities Noted: No No Abnormalities Noted: No Callus: No Atrophie Blanche: No Crepitus: No Cyanosis: No Excoriation: No Ecchymosis: No Fluctuance: No Erythema: No Friable: No Hemosiderin Staining: No Induration: No Mottled: No Localized Edema: No Pallor: No Rash: No Rubor: No Scarring: No Moisture No Abnormalities Noted: No Dry / Scaly: No Maceration: No Moist: Yes Wound Preparation Ulcer Cleansing: Rinsed/Irrigated with Saline Topical Anesthetic Applied: Other: lidocaine 4%, Treatment Notes Wound #3 (Left, Medial Lower Leg) 1. Cleansed with: Clean wound with Normal Saline 2. Anesthetic Topical Lidocaine 4% cream to wound bed prior to debridement 4. Dressing Applied: Santyl Ointment 5. Secondary Dressing Applied ABD Pad 7. Secured with Rolena Infante to Left Lower Extremity Electronic Signature(s) Signed: 06/12/2015 4:58:31 PM By: Gretta Cool, RN, BSN, Kim RN, BSN Entered By: Gretta Cool, RN, BSN, Kim on 06/11/2015 11:09:49 Veazie, Wadsworth Lenna [name] ([id number]) -------------------------------------------------------------------------------- Wound Assessment Details Patient Name: Soley, Clayvon [name]. Date of Service: 06/11/2015 10:30 AM Medical Record Number: [id number] Patient Account Number: [id number] Date of Birth/Sex: 13-Sep-1961 (54 y.o. Male) Treating RN: Cornell Barman Primary Care Physician: Frazier Richards Other Clinician: Referring Physician: Treating Physician/Extender: Frann Rider in  Treatment: 0 Wound Status Wound Number: 4 Primary Diabetic Wound/Ulcer of the Lower Etiology: Extremity Wound Location: Left Lower Leg - Lateral Secondary Venous Leg Ulcer Wounding Event: Gradually Appeared Etiology: Date Acquired: 04/11/2015 Wound Status: Open Weeks Of Treatment: 0 Comorbid Hypertension, Type II Diabetes, Clustered Wound: No History: Osteoarthritis, Neuropathy Photos Photo Uploaded By: Gretta Cool, RN, BSN, Kim on 06/11/2015 11:47:42 Wound Measurements Length: (cm) 0.5 Width: (cm) 0.4 Depth: (cm) 0.1 Area: (cm) 0.157 Volume: (cm) 0.016 % Reduction in Area: 0% % Reduction in Volume: 0% Wound Description Classification: Unable to visualize wound bed  Wound Margin: Indistinct, nonvisible Exudate Amount: None Present Wound Bed Granulation Amount: None Present (0%) Exposed Structure Necrotic Amount: Large (67-100%) Fascia Exposed: No Necrotic Quality: Eschar Fat Layer Exposed: No Tendon Exposed: No Muscle Exposed: No Joint Exposed: No Brummond, Newel [name]. ([id number]) Bone Exposed: No Limited to Skin Breakdown Periwound Skin Texture Texture Color No Abnormalities Noted: No No Abnormalities Noted: No Callus: No Atrophie Blanche: No Crepitus: No Cyanosis: No Excoriation: No Ecchymosis: No Fluctuance: No Erythema: No Friable: No Hemosiderin Staining: No Induration: No Mottled: No Localized Edema: No Pallor: No Rash: No Rubor: No Scarring: No Moisture No Abnormalities Noted: No Dry / Scaly: Yes Maceration: No Moist: No Wound Preparation Ulcer Cleansing: Rinsed/Irrigated with Saline Topical Anesthetic Applied: Other: lidocaine 4%, Treatment Notes Wound #4 (Left, Lateral Lower Leg) 1. Cleansed with: Clean wound with Normal Saline 2. Anesthetic Topical Lidocaine 4% cream to wound bed prior to debridement 4. Dressing Applied: Santyl Ointment 5. Secondary Dressing Applied ABD Pad 7. Secured with Rolena Infante to Left Lower Extremity Electronic  Signature(s) Signed: 06/12/2015 4:58:31 PM By: Gretta Cool, RN, BSN, Kim RN, BSN Entered By: Gretta Cool, RN, BSN, Kim on 06/11/2015 11:08:36 Wirt, Prudencio Lenna [name] ([id number]) -------------------------------------------------------------------------------- Vitals Details Patient Name: [name]. Date of Service: 06/11/2015 10:30 AM Medical Record Number: [id number] Patient Account Number: [id number] Date of Birth/Sex: 1961/02/04 (54 y.o. Male) Treating RN: Cornell Barman Primary Care Physician: Frazier Richards Other Clinician: Referring Physician: Treating Physician/Extender: Frann Rider in Treatment: 0 Vital Signs Time Taken: 10:46 Temperature (F): 98.2 Height (in): 74 Pulse (bpm): 88 Source: Stated Respiratory Rate (breaths/min): 18 Weight (lbs): 260 Blood Pressure (mmHg): 180/88 Source: Stated Reference Range: 80 - 120 mg / dl Body Mass Index (BMI): 33.4 Electronic Signature(s) Signed: 06/12/2015 4:58:31 PM By: Gretta Cool, RN, BSN, Kim RN, BSN Entered By: Gretta Cool, RN, BSN, Kim on 06/11/2015 10:54:50

## 2015-06-14 DIAGNOSIS — L97222 Non-pressure chronic ulcer of left calf with fat layer exposed: Secondary | ICD-10-CM | POA: Diagnosis not present

## 2015-06-14 NOTE — Progress Notes (Signed)
AVIGDOR, DOLLAR ([id number]) Visit Report for 06/14/2015 Arrival Information Details Patient Name: [name], [name]. Date of Service: 06/14/2015 8:00 AM Medical Record Number: [id number] Patient Account Number: [id number] Date of Birth/Sex: [birth year] (54 y.o. Male) Treating RN: Baruch Gouty, RN, BSN, Velva Harman Primary Care Physician: Frazier Richards Other Clinician: Referring Physician: Frazier Richards Treating Physician/Extender: Frann Rider in Treatment: 0 Visit Information History Since Last Visit Any new allergies or adverse reactions: No Patient Arrived: Ambulatory Had a fall or experienced change in No Arrival Time: 08:05 activities of daily living that may affect Accompanied By: self risk of falls: Transfer Assistance: None Signs or symptoms of abuse/neglect since last No Patient Identification Verified: Yes visito Secondary Verification Process Yes Has Dressing in Place as Prescribed: Yes Completed: Has Compression in Place as Prescribed: Yes Patient Has Alerts: Yes Pain Present Now: No Patient Alerts: Type II Diabetic ABI [facility] L 1.30 R 1.31 Electronic Signature(s) Signed: 06/14/2015 8:21:55 AM By: Regan Lemming BSN, RN Entered By: Regan Lemming on 06/14/2015 08:21:54 [name], [name] ([id number]) -------------------------------------------------------------------------------- Encounter Discharge Information Details Patient Name: [name], [name] Date of Service: 06/14/2015 8:00 AM Medical Record Number: [id number] Patient Account Number: [id number] Date of Birth/Sex: [birth year] (54 y.o. Male) Treating RN: Baruch Gouty, RN, BSN, Velva Harman Primary Care Physician: Frazier Richards Other Clinician: Referring Physician: Frazier Richards Treating Physician/Extender: Frann Rider in Treatment: 0 Encounter Discharge Information Items Discharge Pain Level: 0 Discharge Condition: Stable Ambulatory Status: Ambulatory Discharge Destination:  Home Private Transportation: Auto Accompanied By: self Schedule Follow-up Appointment: No Medication Reconciliation completed and No provided to Patient/Care Murlean Seelye: Clinical Summary of Care: Electronic Signature(s) Signed: 06/14/2015 8:22:45 AM By: Regan Lemming BSN, RN Entered By: Regan Lemming on 06/14/2015 08:22:45 [name], [name] ([id number]) -------------------------------------------------------------------------------- Patient/Caregiver Education Details Patient Name: [name]. Date of Service: 06/14/2015 8:00 AM Medical Record Number: [id number] Patient Account Number: [id number] Date of Birth/Gender: 1961-09-29 (54 y.o. Male) Treating RN: Baruch Gouty, RN, BSN, Velva Harman Primary Care Physician: Frazier Richards Other Clinician: Referring Physician: Frazier Richards Treating Physician/Extender: Frann Rider in Treatment: 0 Education Assessment Education Provided To: Patient Education Topics Provided Wound/Skin Impairment: Methods: Explain/Verbal Responses: State content correctly Electronic Signature(s) Signed: 06/14/2015 8:22:31 AM By: Regan Lemming BSN, RN Entered By: Regan Lemming on 06/14/2015 08:22:31

## 2015-06-18 ENCOUNTER — Encounter: Payer: BLUE CROSS/BLUE SHIELD | Admitting: Surgery

## 2015-06-18 DIAGNOSIS — L97222 Non-pressure chronic ulcer of left calf with fat layer exposed: Secondary | ICD-10-CM | POA: Diagnosis not present

## 2015-06-18 NOTE — Progress Notes (Signed)
[name], [name] ([id number]) Visit Report for 06/18/2015 Chief Complaint Document Details Patient Name: [name], [name]. Date of Service: 06/18/2015 11:00 AM Medical Record Number: [id number] Patient Account Number: [id number] Date of Birth/Sex: 15-Nov-1960 (54 y.o. Male) Treating RN: Primary Care Physician: Frazier Richards Other Clinician: Referring Physician: Frazier Richards Treating Physician/Extender: Frann Rider in Treatment: 1 Information Obtained from: Patient Chief Complaint Patient presents for treatment of an open ulcer due to venous insufficiency. He has had a recurrent left lower extremity ulceration since about 2 months Electronic Signature(s) Signed: 06/18/2015 11:45:43 AM By: Christin Fudge MD, FACS Entered By: Christin Fudge on 06/18/2015 11:45:43 Zaino, Rolfe J. ([id number]) -------------------------------------------------------------------------------- Debridement Details Patient Name: [name], [name] Date of Service: 06/18/2015 11:00 AM Medical Record Number: [id number] Patient Account Number: [id number] Date of Birth/Sex: 08-25-1961 (54 y.o. Male) Treating RN: Primary Care Physician: Frazier Richards Other Clinician: Referring Physician: Frazier Richards Treating Physician/Extender: Frann Rider in Treatment: 1 Debridement Performed for Wound #3 Left,Medial Lower Leg Assessment: Performed By: Physician Pat Patrick., MD Debridement: Debridement Pre-procedure Yes Verification/Time Out Taken: Start Time: 11:30 Pain Control: Lidocaine 4% Topical Solution Level: Skin/Subcutaneous Tissue Total Area Debrided (L x 3.5 (cm) x 5.6 (cm) = 19.6 (cm) W): Tissue and other Non-Viable, Exudate, Fibrin/Slough, Subcutaneous material debrided: Instrument: Curette Bleeding: Minimum Hemostasis Achieved: Pressure End Time: 11:35 Procedural Pain: 0 Post Procedural Pain: 0 Response to Treatment: Procedure was tolerated well Post Debridement  Measurements of Total Wound Length: (cm) 3.5 Width: (cm) 5.6 Depth: (cm) 0.2 Volume: (cm) 3.079 Electronic Signature(s) Signed: 06/18/2015 11:44:07 AM By: Christin Fudge MD, FACS Previous Signature: 06/18/2015 11:31:13 AM Version By: Regan Lemming BSN, RN Entered By: Christin Fudge on 06/18/2015 11:44:07 Boroff, Jeray J. ([id number]) -------------------------------------------------------------------------------- HPI Details Patient Name: [name], [name] Date of Service: 06/18/2015 11:00 AM Medical Record Number: [id number] Patient Account Number: [id number] Date of Birth/Sex: [birth year] (54 y.o. Male) Treating RN: Primary Care Physician: Frazier Richards Other Clinician: Referring Physician: Frazier Richards Treating Physician/Extender: Frann Rider in Treatment: 1 History of Present Illness Location: left lower extremity medial and lateral Quality: Patient reports experiencing a dull pain to affected area(s). Severity: Patient states wound are getting worse. Duration: Patient has had the wound for > 2 months prior to seeking treatment at the wound center Timing: Pain in wound is Intermittent (comes and goes Context: The wound appeared gradually over time Modifying Factors: Testing to this date include ABI, Waveforms, Venous studies Associated Signs and Symptoms: Patient reports having difficulty standing for long periods. HPI Description: he had endovenous ablation of his left lower extremity about 2 months ago and soon after that he noted that there was another ulceration on his left lower extremity. He did not seek medical help for very social reasons and now this has progressively gotten worse. 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 Last hemoglobin A1c on 12/06/2014 was 6.4 The patient is a pleasant 54 year old with a past medical history  significant for diabetes, peripheral neuropathy, and chronic venous stasis disease. No history of DVT. No peripheral vascular disease (ABI done at [facility] showed the left to be 1.30 in the right to be 1.31). Ambulating normally per his baseline. 06/18/2015 -- has been coming for twice a week dressing changes and has been doing fine otherwise. Electronic Signature(s) Signed: 06/18/2015 11:46:11 AM By: Christin Fudge MD, FACS Entered By: Christin Fudge on 06/18/2015 11:46:11 Brasil, Sheron J. ([id number]) --------------------------------------------------------------------------------  Physical Exam Details Patient Name: [name], [name] Date of Service: 06/18/2015 11:00 AM Medical Record Number: [id number] Patient Account Number: [id number] Date of Birth/Sex: 16-Jun-1961 (54 y.o. Male) Treating RN: Primary Care Physician: Frazier Richards Other Clinician: Referring Physician: Frazier Richards Treating Physician/Extender: Frann Rider in Treatment: 1 Constitutional . Pulse regular. Respirations normal and unlabored. Afebrile. . Eyes Nonicteric. Reactive to light. Ears, Nose, Mouth, and Throat Lips, teeth, and gums WNL.[name] Moist mucosa without lesions . Neck supple and nontender. No palpable supraclavicular or cervical adenopathy. Normal sized without goiter. Respiratory WNL. No retractions.. Cardiovascular Pedal Pulses WNL. No clubbing, cyanosis or edema. Lymphatic No adneopathy. No adenopathy. No adenopathy. Musculoskeletal Adexa without tenderness or enlargement.. Digits and nails w/o clubbing, cyanosis, infection, petechiae, ischemia, or inflammatory conditions.. Integumentary (Hair, Skin) No suspicious lesions. No crepitus or fluctuance. No peri-wound warmth or erythema. No masses.[name] Psychiatric Judgement and insight Intact.. No evidence of depression, anxiety, or agitation.. Notes the wound is looking much better compared to last week and there is still sharp debridement to be  done to remove some of the necrotic tissue. Electronic Signature(s) Signed: 06/18/2015 11:46:42 AM By: Christin Fudge MD, FACS Entered By: Christin Fudge on 06/18/2015 11:46:41 Chartrand, Vinnie JMarland Kitchen ([id number]) -------------------------------------------------------------------------------- Physician Orders Details Patient Name: [name], Tin J. Date of Service: 06/18/2015 11:00 AM Medical Record Number: [id number] Patient Account Number: [id number] Date of Birth/Sex: Nov 21, 1960 (54 y.o. Male) Treating RN: Baruch Gouty, RN, BSN, [place] Primary Care Physician: Frazier Richards Other Clinician: Referring Physician: Frazier Richards Treating Physician/Extender: Frann Rider in Treatment: 1 Verbal / Phone Orders: Yes Clinician: Afful, RN, BSN, Rita Read Back and Verified: Yes Diagnosis Coding Wound Cleansing Wound #3 Left,Medial Lower Leg o Cleanse wound with mild soap and water Wound #4 Left,Lateral Lower Leg o Cleanse wound with mild soap and water Anesthetic Wound #3 Left,Medial Lower Leg o Topical Lidocaine 4% cream applied to wound bed prior to debridement Wound #4 Left,Lateral Lower Leg o Topical Lidocaine 4% cream applied to wound bed prior to debridement Primary Wound Dressing Wound #3 Left,Medial Lower Leg o Santyl Ointment Wound #4 Left,Lateral Lower Leg o Santyl Ointment Secondary Dressing Wound #3 Left,Medial Lower Leg o ABD pad Wound #4 Left,Lateral Lower Leg o ABD pad Dressing Change Frequency Wound #3 Left,Medial Lower Leg o Other: - twice weekly - Tuesday and Friday Wound #4 Left,Lateral Lower Leg o Other: - twice weekly - Tuesday and Friday Follow-up Appointments Mahajan, Wallis J. ([id number]) Wound #3 Left,Medial Lower Leg o Return Appointment in 1 week. o Nurse Visit as needed - Friday for rewrap Wound #4 Left,Lateral Lower Leg o Return Appointment in 1 week. o Nurse Visit as needed - Friday for rewrap Edema Control Wound #3  Left,Medial Lower Leg o Unna Boot to Left Lower Extremity Wound #4 Left,Lateral Lower Leg o Unna Boot to Left Lower Extremity Electronic Signature(s) Signed: 06/18/2015 11:32:08 AM By: Regan Lemming BSN, RN Signed: 06/18/2015 12:16:19 PM By: Christin Fudge MD, FACS Entered By: Regan Lemming on 06/18/2015 11:32:07 Meggett, Fedor JMarland Kitchen ([id number]) -------------------------------------------------------------------------------- Problem List Details Patient Name: Bushart, Khoa J. Date of Service: 06/18/2015 11:00 AM Medical Record Number: [id number] Patient Account Number: [id number] Date of Birth/Sex: Apr 20, 1961 (54 y.o. 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 ([id number]) -------------------------------------------------------------------------------- Progress Note Details Patient Name: Bogucki, Malakie J. Date of Service: 06/18/2015 11:00 AM Medical Record Number: [id number] Patient Account Number: [id number] Date of Birth/Sex: 1961/07/30 (54 y.o. Male) Treating RN: Primary Care Physician: Frazier Richards Other Clinician: Referring Physician: Frazier Richards Treating Physician/Extender: Frann Rider in Treatment: 1 Subjective Chief Complaint Information obtained from Patient Patient presents  for treatment of an open ulcer due to venous insufficiency. He has had a recurrent left lower extremity ulceration since about 2 months History of Present Illness (HPI) The following HPI elements were documented for the patient's wound: Location: left lower extremity medial and lateral Quality: Patient reports experiencing a dull pain to affected area(s). Severity: Patient states wound are getting worse. Duration: Patient has had the wound for > 2 months prior to seeking treatment at the wound center Timing: Pain in wound is Intermittent (comes and goes Context: The wound appeared gradually over time Modifying Factors: Testing to this date include ABI, Waveforms, Venous studies Associated Signs and Symptoms: Patient reports having difficulty standing for long periods. he had endovenous ablation of his left lower extremity about 2 months ago and soon after that he noted that there was another ulceration on his left lower extremity. He did not seek medical help for very social reasons and now this has progressively gotten worse. 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 Last hemoglobin A1c on 12/06/2014 was 6.4 The patient is a pleasant 54 year old with a past medical history significant for diabetes, peripheral neuropathy, and chronic venous stasis disease. No history of DVT. No peripheral vascular disease (ABI done at [facility] showed the left to be 1.30 in the right to be 1.31). Ambulating normally per his baseline. 06/18/2015 -- has been coming for twice a week dressing changes and has been doing fine otherwise. Objective Constitutional Payes, Guillermo J. ([id number]) Pulse regular. Respirations normal and unlabored. Afebrile. Vitals Time Taken: 11:13 AM, Height: 74 in, Weight: 260 lbs, BMI: 33.4, Temperature: 98.2 F, Pulse: 88 bpm, Respiratory Rate: 18  breaths/min, Blood Pressure: 194/88 mmHg. Eyes Nonicteric. Reactive to light. Ears, Nose, Mouth, and Throat Lips, teeth, and gums WNL.[name] Moist mucosa without lesions . Neck supple and nontender. No palpable supraclavicular or cervical adenopathy. Normal sized without goiter. Respiratory WNL. No retractions.. Cardiovascular Pedal Pulses WNL. No clubbing, cyanosis or edema. Lymphatic No adneopathy. No adenopathy. No adenopathy. Musculoskeletal Adexa without tenderness or enlargement.. Digits and nails w/o clubbing, cyanosis, infection, petechiae, ischemia, or inflammatory conditions.[name] Psychiatric Judgement and insight Intact.. No evidence of depression, anxiety, or agitation.. General Notes: the wound is looking much better compared to last week and there is still sharp debridement to be done to remove some of the necrotic tissue. Integumentary (Hair, Skin) No suspicious lesions. No crepitus or fluctuance. No peri-wound warmth or erythema. No masses.. Wound #3 status is Open. Original cause of wound was Gradually Appeared. The wound is located on the Left,Medial Lower Leg. The wound measures 3.5cm length x 5.6cm width x 0.2cm depth; 15.394cm^2 area and 3.079cm^3 volume. The wound is limited to skin breakdown. There is no tunneling or undermining noted. There is a medium amount of serous  drainage noted. The wound margin is flat and intact. There is small (1-33%) granulation within the wound bed. There is a medium (34-66%) amount of necrotic tissue within the wound bed including Adherent Slough. The periwound skin appearance exhibited: Moist. The periwound skin appearance did not exhibit: Callus, Crepitus, Excoriation, Fluctuance, Friable, Induration, Localized Edema, Rash, Scarring, Dry/Scaly, Maceration, Atrophie Blanche, Cyanosis, Ecchymosis, Hemosiderin Staining, Mottled, Pallor, Rubor, Erythema. Periwound temperature was noted as No Abnormality. The periwound has tenderness on  palpation. Wound #4 status is Open. Original cause of wound was Gradually Appeared. The wound is located on the Left,Lateral Lower Leg. The wound measures 0.8cm length x 0.6cm width x 0.3cm depth; 0.377cm^2 area Valenza, Ryott J. ([id number]) and 0.113cm^3 volume. The wound is limited to skin breakdown. There is no tunneling or undermining noted. There is a medium amount of serosanguineous drainage noted. The wound margin is indistinct and nonvisible. There is medium (34-66%) granulation within the wound bed. There is a small (1-33%) amount of necrotic tissue within the wound bed including Eschar. The periwound skin appearance exhibited: Localized Edema, Moist. The periwound skin appearance did not exhibit: Callus, Crepitus, Excoriation, Fluctuance, Friable, Induration, Rash, Scarring, Dry/Scaly, Maceration, Atrophie Blanche, Cyanosis, Ecchymosis, Hemosiderin Staining, Mottled, Pallor, Rubor, Erythema. Periwound temperature was noted as No Abnormality. The periwound has tenderness on palpation. 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 I will see him back next week. Procedures Wound #3 Wound #3 is a Diabetic Wound/Ulcer of the Lower Extremity located on the Left,Medial Lower Leg . There was a Skin/Subcutaneous Tissue Debridement ([id number]) debridement with total area of 19.6 sq cm performed by Pat Patrick., MD. with the following instrument(s): Curette to remove Non-Viable tissue/material including Exudate, Fibrin/Slough, and Subcutaneous after achieving pain control using Lidocaine 4% Topical Solution. A time out was conducted  prior to the start of the procedure. A Minimum amount of bleeding was controlled with Pressure. The procedure was tolerated well with a pain level of 0 throughout and a pain level of 0 following the procedure. Post Debridement Measurements: 3.5cm length x 5.6cm width x 0.2cm depth; 3.079cm^3 volume. Ruhe, Loreto Lenna Sciara ([id number]) Plan Wound Cleansing: Wound #3 Left,Medial Lower Leg: Cleanse wound with mild soap and water Wound #4 Left,Lateral Lower Leg: Cleanse wound with mild soap and water Anesthetic: Wound #3 Left,Medial Lower Leg: Topical Lidocaine 4% cream applied to wound bed prior to debridement Wound #4 Left,Lateral Lower Leg: Topical Lidocaine 4% cream applied to wound bed prior to debridement Primary Wound Dressing: Wound #3 Left,Medial Lower Leg: Santyl Ointment Wound #4 Left,Lateral Lower Leg: Santyl Ointment Secondary Dressing: Wound #3 Left,Medial Lower Leg: ABD pad Wound #4 Left,Lateral Lower Leg: ABD pad Dressing Change Frequency: Wound #3 Left,Medial Lower Leg: Other: - twice weekly - Tuesday and Friday Wound #4 Left,Lateral Lower Leg: Other: - twice weekly - Tuesday and Friday Follow-up Appointments: Wound #3 Left,Medial Lower Leg: Return Appointment in 1 week. Nurse Visit as needed - Friday for rewrap Wound #4 Left,Lateral Lower Leg: Return Appointment in 1 week. Nurse Visit as needed - Friday for rewrap Edema Control: Wound #3 Left,Medial Lower Leg: Unna Boot to Left Lower Extremity Wound #4 Left,Lateral Lower  Leg: Unna Boot to Left Lower Extremity The left lower extremity wound look better and we will continue with Santyl and Unna's boot to be changed twice a week. Mccaslin, Gleb J. ([id number]) I will see him back next week. Electronic Signature(s) Signed: 06/18/2015 11:48:04 AM By: Christin Fudge MD, FACS Previous Signature: 06/18/2015 11:47:51 AM Version By: Christin Fudge MD, FACS Entered By: Christin Fudge on 06/18/2015 11:48:04 Taitt, Dasani J.  ([id number]) -------------------------------------------------------------------------------- SuperBill Details Patient Name: Pirro, Carles J. Date of Service: 06/18/2015 Medical Record Number: [id number] Patient Account Number: [id number] Date of Birth/Sex: Apr 07, 1961 (54 y.o. 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 I87.2 Venous insufficiency (chronic) (peripheral) L97.222 Non-pressure chronic ulcer of left calf with fat layer exposed Facility Procedures CPT4 Code: [id number] Description: 01007 - DEB SUBQ TISSUE 20 SQ CM/< ICD-10 Description Diagnosis E11.622 Type 2 diabetes mellitus 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 I87.2 Venous insufficiency (chronic) (peripheral) L97.222  Non-pressure chronic ulcer of left calf with fat l Modifier: of calf ayer exposed Quantity: 1 Physician Procedures CPT4 Code: 1219758 Description: 83254 - WC PHYS SUBQ TISS 20 SQ CM ICD-10 Description Diagnosis E11.622 Type 2 diabetes mellitus 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 I87.2 Venous insufficiency (chronic) (peripheral) L97.222  Non-pressure chronic ulcer of left calf with fat l Modifier: of calf ayer exposed Quantity: 1 Electronic Signature(s) Signed: 06/18/2015 11:49:13 AM By: Christin Fudge MD, FACS Entered By: Christin Fudge on 06/18/2015 11:49:13

## 2015-06-18 NOTE — Progress Notes (Signed)
[name], [name] ([id number]) Visit Report for 06/18/2015 Arrival Information Details Patient Name: [name], [name]. Date of Service: 06/18/2015 11:00 AM Medical Record Number: [id number] Patient Account Number: [id number] Date of Birth/Sex: Mar 31, 1961 (54 y.o. Male) Treating RN: Baruch Gouty, RN, BSN, Velva Harman Primary Care Physician: Frazier Richards Other Clinician: Referring Physician: Frazier Richards Treating Physician/Extender: Frann Rider in Treatment: 1 Visit Information History Since Last Visit Added or deleted any medications: No Patient Arrived: Ambulatory Any new allergies or adverse reactions: No Arrival Time: 11:12 Had a fall or experienced change in No Accompanied By: self activities of daily living that may affect Transfer Assistance: None risk of falls: Patient Identification Verified: Yes Signs or symptoms of abuse/neglect since last No Secondary Verification Process Yes visito Completed: Hospitalized since last visit: No Patient Has Alerts: Yes Has Dressing in Place as Prescribed: Yes Patient Alerts: Type II Diabetic Has Compression in Place as Prescribed: Yes ABI AVVS L 1.30 R Pain Present Now: No 1.31 Electronic Signature(s) Signed: 06/18/2015 11:12:57 AM By: Regan Lemming BSN, RN Entered By: Regan Lemming on 06/18/2015 11:12:57 Volk, Rhonin J. ([id number]) -------------------------------------------------------------------------------- Encounter Discharge Information Details Patient Name: [name], [name] Date of Service: 06/18/2015 11:00 AM Medical Record Number: [id number] Patient Account Number: [id number] Date of Birth/Sex: 19-Sep-1961 (54 y.o. Male) Treating RN: Baruch Gouty, RN, BSN, Velva Harman Primary Care Physician: Frazier Richards Other Clinician: Referring Physician: Frazier Richards Treating Physician/Extender: Frann Rider in Treatment: 1 Encounter Discharge Information Items Discharge Pain Level: 0 Discharge Condition: Stable Ambulatory  Status: Ambulatory Discharge Destination: Home Private Transportation: Auto Accompanied By: self Schedule Follow-up Appointment: No Medication Reconciliation completed and No provided to Patient/Care Camala Talwar: Clinical Summary of Care: Electronic Signature(s) Signed: 06/18/2015 11:49:08 AM By: Regan Lemming BSN, RN Entered By: Regan Lemming on 06/18/2015 11:49:08 Ketterman, Advik J. ([id number]) -------------------------------------------------------------------------------- Lower Extremity Assessment Details Patient Name: [name], [name] Date of Service: 06/18/2015 11:00 AM Medical Record Number: [id number] Patient Account Number: [id number] Date of Birth/Sex: 10-07-1961 (54 y.o. Male) Treating RN: Baruch Gouty, RN, BSN, Velva Harman Primary Care Physician: Frazier Richards Other Clinician: Referring Physician: Frazier Richards Treating Physician/Extender: Frann Rider in Treatment: 1 Edema Assessment Assessed: Shirlyn Goltz: No] [Right: No] E[Left: dema] [Right: :] Calf Left: Right: Point of Measurement: 34 cm From Medial Instep 36.8 cm cm Ankle Left: Right: Point of Measurement: 10 cm From Medial Instep 26 cm cm Vascular Assessment Claudication: Claudication Assessment [Left:None] Pulses: Posterior Tibial Dorsalis Pedis Palpable: [Left:Yes] Extremity colors, hair growth, and conditions: Extremity Color: [Left:Normal] Hair Growth on Extremity: [Left:Yes] Temperature of Extremity: [Left:Warm] Capillary Refill: [Left:< 3 seconds] Toe Nail Assessment Left: Right: Thick: No Discolored: No Deformed: No Improper Length and Hygiene: No Electronic Signature(s) Signed: 06/18/2015 11:18:03 AM By: Regan Lemming BSN, RN Entered By: Regan Lemming on 06/18/2015 11:18:03 Tootle, Cartier J. ([id number]) Bartelt, Keshon J. ([id number]) -------------------------------------------------------------------------------- Multi Wound Chart Details Patient Name: [name], [name] Date of Service: 06/18/2015 11:00  AM Medical Record Number: [id number] Patient Account Number: [id number] Date of Birth/Sex: 1961/04/13 (54 y.o. Male) Treating RN: Baruch Gouty, RN, BSN, Velva Harman Primary Care Physician: Frazier Richards Other Clinician: Referring Physician: Frazier Richards Treating Physician/Extender: Frann Rider in Treatment: 1 Vital Signs Height(in): 74 Pulse(bpm): 88 Weight(lbs): 260 Blood Pressure 194/88 (mmHg): Body Mass Index(BMI): 33 Temperature(F): 98.2 Respiratory Rate 18 (breaths/min): Photos: [3:No Photos] [4:No Photos] [N/A:N/A] Wound Location: [3:Left Lower Leg - Medial] [4:Left Lower Leg - Lateral] [N/A:N/A] Wounding Event: [3:Gradually Appeared] [4:Gradually Appeared] [N/A:N/A] Primary Etiology: [3:Diabetic Wound/Ulcer of the Lower Extremity] [4:Diabetic Wound/Ulcer of the Lower Extremity] [  N/A:N/A] 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] [4:Venous Leg Ulcer] [N/A:N/A] Comorbid History: [3:Hypertension, Type II Diabetes, Osteoarthritis, Neuropathy] [4:Hypertension, Type II Diabetes, Osteoarthritis, Neuropathy] [N/A:N/A] Date Acquired: [3:04/11/2015] [4:04/11/2015] [N/A:N/A] Weeks of Treatment: [3:1] [4:1] [N/A:N/A] Wound Status: [3:Open] [4:Open] [N/A:N/A] Measurements L x W x D 3.5x5.6x0.2 [4:0.8x0.6x0.3] [N/A:N/A] (cm) Area (cm) : [3:15.394] [4:0.377] [N/A:N/A] Volume (cm) : [3:3.079] [4:0.113] [N/A:N/A] % Reduction in Area: [3:-7.70%] [4:-140.10%] [N/A:N/A] % Reduction in Volume: 28.20% [4:-606.20%] [N/A:N/A] Classification: [3:Grade 2] [4:Unable to visualize wound N/A bed] Exudate Amount: [3:Medium] [4:Medium] [N/A:N/A] Exudate Type: [3:Serous] [4:Serosanguineous] [N/A:N/A] Exudate Color: [3:amber] [4:red, brown] [N/A:N/A] Wound Margin: [3:Flat and Intact] [4:Indistinct, nonvisible] [N/A:N/A] Granulation Amount: [3:Small (1-33%)] [4:Medium (34-66%)] [N/A:N/A] Necrotic Amount: [3:Medium (34-66%)] [4:Small (1-33%)] [N/A:N/A] Necrotic Tissue: [3:Adherent Slough] [4:Eschar]  [N/A:N/A] Exposed Structures: [3:Fascia: No Fat: No Tendon: No] [4:Fascia: No Fat: No Tendon: No] [N/A:N/A] Muscle: No Muscle: No Joint: No Joint: No Bone: No Bone: No Limited to Skin Limited to Skin Breakdown Breakdown Periwound Skin Texture: Edema: No Edema: Yes N/A Excoriation: No Excoriation: No Induration: No Induration: No Callus: No Callus: No Crepitus: No Crepitus: No Fluctuance: No Fluctuance: No Friable: No Friable: No Rash: No Rash: No Scarring: No Scarring: No Periwound Skin Moist: Yes Moist: Yes N/A Moisture: Maceration: No Maceration: No Dry/Scaly: No Dry/Scaly: No Periwound Skin Color: Atrophie Blanche: No Atrophie Blanche: No N/A Cyanosis: No Cyanosis: No Ecchymosis: No Ecchymosis: No Erythema: No Erythema: No Hemosiderin Staining: No Hemosiderin Staining: No Mottled: No Mottled: No Pallor: No Pallor: No Rubor: No Rubor: No Temperature: No Abnormality No Abnormality N/A Tenderness on Yes Yes N/A Palpation: Wound Preparation: Ulcer Cleansing: Other: Ulcer Cleansing: Other: N/A soap and water soap and water Topical Anesthetic Topical Anesthetic Applied: Other: lidocaine Applied: Other: lidocaine 4% 4% Treatment Notes Electronic Signature(s) Signed: 06/18/2015 11:29:14 AM By: Regan Lemming BSN, RN Entered By: Regan Lemming on 06/18/2015 11:29:14 Hottel, Londen JMarland Kitchen ([id number]) -------------------------------------------------------------------------------- [place] Details Patient Name: [name], Seith J. Date of Service: 06/18/2015 11:00 AM Medical Record Number: [id number] Patient Account Number: [id number] Date of Birth/Sex: [birth year] (54 y.o. Male) Treating RN: Baruch Gouty, RN, BSN, Velva Harman Primary Care Physician: Frazier Richards Other Clinician: Referring Physician: Frazier Richards Treating Physician/Extender: Frann Rider in Treatment: 1 Active Inactive Venous Leg Ulcer Nursing Diagnoses: Actual venous  Insuffiency (use after diagnosis is confirmed) Goals: Patient will maintain optimal edema control Date Initiated: 06/11/2015 Goal Status: Active Patient/caregiver will verbalize understanding of disease process and disease management Date Initiated: 06/11/2015 Goal Status: Active Interventions: Assess peripheral edema status every visit. Compression as ordered Notes: Wound/Skin Impairment Nursing Diagnoses: Impaired tissue integrity Goals: Ulcer/skin breakdown will have a volume reduction of 30% by week 4 Date Initiated: 06/11/2015 Goal Status: Active Ulcer/skin breakdown will have a volume reduction of 50% by week 8 Date Initiated: 06/11/2015 Goal Status: Active Ulcer/skin breakdown will have a volume reduction of 80% by week 12 Date Initiated: 06/11/2015 Goal Status: Active Ulcer/skin breakdown will heal within 14 weeks Date Initiated: 06/11/2015 Inoue, Grayling Congress ([id number]) Goal Status: Active Interventions: Assess patient/caregiver ability to perform ulcer/skin care regimen upon admission and as needed Assess ulceration(s) every visit Notes: Electronic Signature(s) Signed: 06/18/2015 11:27:48 AM By: Regan Lemming BSN, RN Entered By: Regan Lemming on 06/18/2015 11:27:48 Foerster, Omarr J. ([id number]) -------------------------------------------------------------------------------- Pain Assessment Details Patient Name: Tolliver, Zohaib J. Date of Service: 06/18/2015 11:00 AM Medical Record Number: [id number] Patient Account Number: [id number] Date of Birth/Sex: [birth year] (54 y.o. Male) Treating RN: Baruch Gouty, RN, BSN, Velva Harman Primary Care Physician: Frazier Richards Other Clinician:  Referring Physician: Frazier Richards Treating Physician/Extender: Frann Rider in Treatment: 1 Active Problems Location of Pain Severity and Description of Pain Patient Has Paino No Site Locations Pain Management and Medication Current Pain Management: Electronic Signature(s) Signed: 06/18/2015  11:13:08 AM By: Regan Lemming BSN, RN Entered By: Regan Lemming on 06/18/2015 11:13:08 Quattrone, Illias Lenna Sciara ([id number]) -------------------------------------------------------------------------------- Patient/Caregiver Education Details Patient Name: Hulda Marin. Date of Service: 06/18/2015 11:00 AM Medical Record Number: [id number] Patient Account Number: [id number] Date of Birth/Gender: 12/07/1960 (54 y.o. Male) Treating RN: Baruch Gouty, RN, BSN, Velva Harman Primary Care Physician: Frazier Richards Other Clinician: Referring Physician: Frazier Richards Treating Physician/Extender: Frann Rider in Treatment: 1 Education Assessment Education Provided To: Patient Education Topics Provided Basic Hygiene: Methods: Explain/Verbal Responses: State content correctly Wound/Skin Impairment: Methods: Explain/Verbal Responses: State content correctly Electronic Signature(s) Signed: 06/18/2015 11:49:22 AM By: Regan Lemming BSN, RN Entered By: Regan Lemming on 06/18/2015 11:49:22 Leccese, Canyon J. ([id number]) -------------------------------------------------------------------------------- Wound Assessment Details Patient Name: Vassar, Lisa J. Date of Service: 06/18/2015 11:00 AM Medical Record Number: [id number] Patient Account Number: [id number] Date of Birth/Sex: 24-Apr-1961 (54 y.o. Male) Treating RN: Baruch Gouty, RN, BSN, [place] Primary Care Physician: Frazier Richards Other Clinician: Referring Physician: Frazier Richards Treating Physician/Extender: Frann Rider in Treatment: 1 Wound Status Wound Number: 3 Primary Diabetic Wound/Ulcer of the Lower Etiology: Extremity Wound Location: Left Lower Leg - Medial Secondary Venous Leg Ulcer Wounding Event: Gradually Appeared Etiology: Date Acquired: 04/11/2015 Wound Status: Open Weeks Of Treatment: 1 Comorbid Hypertension, Type II Diabetes, Clustered Wound: No History: Osteoarthritis, Neuropathy Photos Photo Uploaded By: Regan Lemming on  06/18/2015 16:23:34 Wound Measurements Length: (cm) 3.5 Width: (cm) 5.6 Depth: (cm) 0.2 Area: (cm) 15.394 Volume: (cm) 3.079 % Reduction in Area: -7.7% % Reduction in Volume: 28.2% Tunneling: No Undermining: No Wound Description Classification: Grade 2 Wound Margin: Flat and Intact Exudate Amount: Medium Exudate Type: Serous Exudate Color: amber Wound Bed Granulation Amount: Small (1-33%) Exposed Structure Necrotic Amount: Medium (34-66%) Fascia Exposed: No Necrotic Quality: Adherent Slough Fat Layer Exposed: No Tendon Exposed: No Parlier, Rebecca J. ([id number]) Muscle Exposed: No Joint Exposed: No Bone Exposed: No Limited to Skin Breakdown Periwound Skin Texture Texture Color No Abnormalities Noted: No No Abnormalities Noted: No Callus: No Atrophie Blanche: No Crepitus: No Cyanosis: No Excoriation: No Ecchymosis: No Fluctuance: No Erythema: No Friable: No Hemosiderin Staining: No Induration: No Mottled: No Localized Edema: No Pallor: No Rash: No Rubor: No Scarring: No Temperature / Pain Moisture Temperature: No Abnormality No Abnormalities Noted: No Tenderness on Palpation: Yes Dry / Scaly: No Maceration: No Moist: Yes Wound Preparation Ulcer Cleansing: Other: soap and water, Topical Anesthetic Applied: Other: lidocaine 4%, Treatment Notes Wound #3 (Left, Medial Lower Leg) 1. Cleansed with: Cleanse wound with antibacterial soap and water May Shower, gently pat wound dry prior to applying new dressing. May shower with protection 2. Anesthetic Topical Lidocaine 4% cream to wound bed prior to debridement 4. Dressing Applied: Santyl Ointment 5. Secondary Dressing Applied ABD Pad 7. Secured with Rolena Infante to Left Lower Extremity Electronic Signature(s) Signed: 06/18/2015 11:27:38 AM By: Regan Lemming BSN, RN Entered By: Regan Lemming on 06/18/2015 11:27:37 Daus, Clemmie J. ([id number]) Mcneal, Eston J.  ([id number]) -------------------------------------------------------------------------------- Wound Assessment Details Patient Name: Teaney, Khoury J. Date of Service: 06/18/2015 11:00 AM Medical Record Number: [id number] Patient Account Number: [id number] Date of Birth/Sex: 1961-07-05 (54 y.o. Male) Treating RN: Baruch Gouty, RN, BSN, Velva Harman Primary Care Physician: Frazier Richards Other Clinician: Referring Physician: Frazier Richards Treating Physician/Extender: Frann Rider in  Treatment: 1 Wound Status Wound Number: 4 Primary Diabetic Wound/Ulcer of the Lower Etiology: Extremity Wound Location: Left Lower Leg - Lateral Secondary Venous Leg Ulcer Wounding Event: Gradually Appeared Etiology: Date Acquired: 04/11/2015 Wound Status: Open Weeks Of Treatment: 1 Comorbid Hypertension, Type II Diabetes, Clustered Wound: No History: Osteoarthritis, Neuropathy Photos Photo Uploaded By: Regan Lemming on 06/18/2015 16:23:49 Wound Measurements Length: (cm) 0.8 Width: (cm) 0.6 Depth: (cm) 0.3 Area: (cm) 0.377 Volume: (cm) 0.113 % Reduction in Area: -140.1% % Reduction in Volume: -606.2% Tunneling: No Undermining: No Wound Description Classification: Unable to visualize wound bed Foul Odor Aft Wound Margin: Indistinct, nonvisible Exudate Amount: Medium Exudate Type: Serosanguineous Exudate Color: red, brown er Cleansing: No Wound Bed Granulation Amount: Medium (34-66%) Exposed Structure Necrotic Amount: Small (1-33%) Fascia Exposed: No Necrotic Quality: Eschar Fat Layer Exposed: No Tendon Exposed: No Trammel, Jarad J. ([id number]) Muscle Exposed: No Joint Exposed: No Bone Exposed: No Limited to Skin Breakdown Periwound Skin Texture Texture Color No Abnormalities Noted: No No Abnormalities Noted: No Callus: No Atrophie Blanche: No Crepitus: No Cyanosis: No Excoriation: No Ecchymosis: No Fluctuance: No Erythema: No Friable: No Hemosiderin Staining: No Induration:  No Mottled: No Localized Edema: Yes Pallor: No Rash: No Rubor: No Scarring: No Temperature / Pain Moisture Temperature: No Abnormality No Abnormalities Noted: No Tenderness on Palpation: Yes Dry / Scaly: No Maceration: No Moist: Yes Wound Preparation Ulcer Cleansing: Other: soap and water, Topical Anesthetic Applied: Other: lidocaine 4%, Treatment Notes Wound #4 (Left, Lateral Lower Leg) 1. Cleansed with: Cleanse wound with antibacterial soap and water May Shower, gently pat wound dry prior to applying new dressing. May shower with protection 2. Anesthetic Topical Lidocaine 4% cream to wound bed prior to debridement 4. Dressing Applied: Santyl Ointment 5. Secondary Dressing Applied ABD Pad 7. Secured with Rolena Infante to Left Lower Extremity Electronic Signature(s) Signed: 06/18/2015 11:27:01 AM By: Regan Lemming BSN, RN Entered By: Regan Lemming on 06/18/2015 11:27:00 Sitts, [facility] J. ([id number]) Schlageter, Arley J. ([id number]) -------------------------------------------------------------------------------- Vitals Details Patient Name: [name], Loris J. Date of Service: 06/18/2015 11:00 AM Medical Record Number: [id number] Patient Account Number: [id number] Date of Birth/Sex: [birth year] (54 y.o. Male) Treating RN: Baruch Gouty, RN, BSN, [place] Primary Care Physician: Frazier Richards Other Clinician: Referring Physician: Frazier Richards Treating Physician/Extender: Frann Rider in Treatment: 1 Vital Signs Time Taken: 11:13 Temperature (F): 98.2 Height (in): 74 Pulse (bpm): 88 Weight (lbs): 260 Respiratory Rate (breaths/min): 18 Body Mass Index (BMI): 33.4 Blood Pressure (mmHg): 194/88 Reference Range: 80 - 120 mg / dl Electronic Signature(s) Signed: 06/18/2015 11:13:59 AM By: Regan Lemming BSN, RN Entered By: Regan Lemming on 06/18/2015 11:13:58

## 2015-06-21 DIAGNOSIS — L97222 Non-pressure chronic ulcer of left calf with fat layer exposed: Secondary | ICD-10-CM | POA: Diagnosis not present

## 2015-06-25 ENCOUNTER — Encounter: Payer: BLUE CROSS/BLUE SHIELD | Admitting: Surgery

## 2015-06-25 DIAGNOSIS — L97222 Non-pressure chronic ulcer of left calf with fat layer exposed: Secondary | ICD-10-CM | POA: Diagnosis not present

## 2015-06-25 NOTE — Progress Notes (Signed)
DIAR, BERKEL ([id number]) Visit Report for 06/25/2015 Arrival Information Details Patient Name: [name], [name]. Date of Service: 06/25/2015 2:30 PM Medical Record Number: [id number] Patient Account Number: [id number] Date of Birth/Sex: [birth year] (54 y.o. Male) Treating RN: Baruch Gouty, RN, BSN, Velva Harman Primary Care Physician: Frazier Richards Other Clinician: Referring Physician: Frazier Richards Treating Physician/Extender: Frann Rider in Treatment: 2 Visit Information History Since Last Visit Any new allergies or adverse reactions: No Patient Arrived: Ambulatory Had a fall or experienced change in No Arrival Time: 14:45 activities of daily living that may affect Accompanied By: self risk of falls: Transfer Assistance: None Signs or symptoms of abuse/neglect since last No Patient Identification Verified: Yes visito Secondary Verification Process Yes Hospitalized since last visit: No Completed: Has Dressing in Place as Prescribed: Yes Patient Has Alerts: Yes Pain Present Now: No Electronic Signature(s) Signed: 06/25/2015 2:45:31 PM By: Regan Lemming BSN, RN Entered By: Regan Lemming on 06/25/2015 14:45:30 [name] ([id number]) -------------------------------------------------------------------------------- Encounter Discharge Information Details Patient Name: [name], [name]. Date of Service: 06/25/2015 2:30 PM Medical Record Number: [id number] Patient Account Number: [id number] Date of Birth/Sex: Jun 08, 1961 (54 y.o. Male) Treating RN: Baruch Gouty, RN, BSN, Velva Harman Primary Care Physician: Frazier Richards Other Clinician: Referring Physician: Frazier Richards Treating Physician/Extender: Frann Rider in Treatment: 2 Encounter Discharge Information Items Discharge Pain Level: 0 Discharge Condition: Stable Ambulatory Status: Ambulatory Discharge Destination: Home Private Transportation: Auto Accompanied By: self Schedule Follow-up Appointment:  No Medication Reconciliation completed and No provided to Patient/Care [name]: Clinical Summary of Care: Electronic Signature(s) Signed: 06/25/2015 3:24:08 PM By: Regan Lemming BSN, RN Entered By: Regan Lemming on 06/25/2015 15:24:08 [name] ([id number]) -------------------------------------------------------------------------------- Lower Extremity Assessment Details Patient Name: [name]. Date of Service: 06/25/2015 2:30 PM Medical Record Number: [id number] Patient Account Number: [id number] Date of Birth/Sex: Jul 01, 1961 (54 y.o. Male) Treating RN: Baruch Gouty, RN, BSN, Velva Harman Primary Care Physician: Frazier Richards Other Clinician: Referring Physician: Frazier Richards Treating Physician/Extender: Frann Rider in Treatment: 2 Edema Assessment Assessed: Shirlyn Goltz: No] [Right: No] E[Left: dema] [Right: :] Calf Left: Right: Point of Measurement: 34 cm From Medial Instep 36.7 cm cm Ankle Left: Right: Point of Measurement: 10 cm From Medial Instep 26 cm cm Vascular Assessment Claudication: Claudication Assessment [Left:None] Pulses: Posterior Tibial Dorsalis Pedis Palpable: [Left:Yes] Extremity colors, hair growth, and conditions: Extremity Color: [Left:Normal] Hair Growth on Extremity: [Left:Yes] Temperature of Extremity: [Left:Warm] Capillary Refill: [Left:< 3 seconds] Dependent Rubor: [Left:No] Blanched when Elevated: [Left:No] Lipodermatosclerosis: [Left:No] Toe Nail Assessment Left: Right: Thick: No Discolored: No Deformed: No Improper Length and Hygiene: No [name] ([id number]) Electronic Signature(s) Signed: 06/25/2015 2:47:47 PM By: Regan Lemming BSN, RN Entered By: Regan Lemming on 06/25/2015 14:47:46 [name]. ([id number]) -------------------------------------------------------------------------------- Multi Wound Chart Details Patient Name: [name]. Date of Service: 06/25/2015 2:30 PM Medical Record Number:  [id number] Patient Account Number: [id number] Date of Birth/Sex: Oct 21, 1961 (54 y.o. Male) Treating RN: Baruch Gouty, RN, BSN, Velva Harman Primary Care Physician: Frazier Richards Other Clinician: Referring Physician: Frazier Richards Treating Physician/Extender: Frann Rider in Treatment: 2 Vital Signs Height(in): 74 Pulse(bpm): 87 Weight(lbs): 260 Blood Pressure 191/75 (mmHg): Body Mass Index(BMI): 33 Temperature(F): 98.4 Respiratory Rate 18 (breaths/min): Photos: [3:No Photos] [4:No Photos] [N/A:N/A] Wound Location: [3:Left Lower Leg - Medial] [4:Left Lower Leg - Lateral] [N/A:N/A] Wounding Event: [3:Gradually Appeared] [4:Gradually Appeared] [N/A:N/A] Primary Etiology: [3:Diabetic Wound/Ulcer of the Lower Extremity] [4:Diabetic Wound/Ulcer of the Lower Extremity] [N/A:N/A] 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] [4:Venous Leg Ulcer] [N/A:N/A] Comorbid History: [3:Hypertension, Type II  Diabetes, Osteoarthritis, Neuropathy] [4:Hypertension, Type II Diabetes, Osteoarthritis, Neuropathy] [N/A:N/A] Date Acquired: [3:04/11/2015] [4:04/11/2015] [N/A:N/A] Weeks of Treatment: [3:2] [4:2] [N/A:N/A] Wound Status: [3:Open] [4:Open] [N/A:N/A] Measurements L x W x D 3.5x5.6x0.2 [4:0.6x0.5x0.3] [N/A:N/A] (cm) Area (cm) : [3:15.394] [4:0.236] [N/A:N/A] Volume (cm) : [3:3.079] [4:0.071] [N/A:N/A] % Reduction in Area: [3:-7.70%] [4:-50.30%] [N/A:N/A] % Reduction in Volume: 28.20% [4:-343.70%] [N/A:N/A] Classification: [3:Grade 2] [4:Unable to visualize wound N/A bed] Exudate Amount: [3:Medium] [4:Medium] [N/A:N/A] Exudate Type: [3:Serous] [4:Serosanguineous] [N/A:N/A] Exudate Color: [3:amber] [4:red, brown] [N/A:N/A] Wound Margin: [3:Flat and Intact] [4:Indistinct, nonvisible] [N/A:N/A] Granulation Amount: [3:Medium (34-66%)] [4:Large (67-100%)] [N/A:N/A] Granulation Quality: [3:Pink, Pale] [4:Red] [N/A:N/A] Necrotic Amount: [3:Medium (34-66%)] [4:Small (1-33%)] [N/A:N/A] Exposed  Structures: [3:Fascia: No Fat: No Tendon: No] [4:Fascia: No Fat: No Tendon: No] [N/A:N/A] Muscle: No Muscle: No Joint: No Joint: No Bone: No Bone: No Limited to Skin Limited to Skin Breakdown Breakdown Epithelialization: Medium (34-66%) N/A N/A Periwound Skin Texture: Edema: No Edema: No N/A Excoriation: No Excoriation: No Induration: No Induration: No Callus: No Callus: No Crepitus: No Crepitus: No Fluctuance: No Fluctuance: No Friable: No Friable: No Rash: No Rash: No Scarring: No Scarring: No Periwound Skin Moist: Yes Moist: Yes N/A Moisture: Maceration: No Maceration: No Dry/Scaly: No Dry/Scaly: No Periwound Skin Color: Atrophie Blanche: No Atrophie Blanche: No N/A Cyanosis: No Cyanosis: No Ecchymosis: No Ecchymosis: No Erythema: No Erythema: No Hemosiderin Staining: No Hemosiderin Staining: No Mottled: No Mottled: No Pallor: No Pallor: No Rubor: No Rubor: No Temperature: No Abnormality No Abnormality N/A Tenderness on Yes Yes N/A Palpation: Wound Preparation: Ulcer Cleansing: Other: Ulcer Cleansing: Other: N/A soap and water soap and water Topical Anesthetic Topical Anesthetic Applied: Other: lidocaine Applied: Other: lidocaine 4% 4% Treatment Notes Electronic Signature(s) Signed: 06/25/2015 3:02:08 PM By: Regan Lemming BSN, RN Entered By: Regan Lemming on 06/25/2015 15:02:08 Karabin, Tennis Lenna [name] ([id number]) -------------------------------------------------------------------------------- Multi-Disciplinary Care Plan Details Patient Name: [name], [name]. Date of Service: 06/25/2015 2:30 PM Medical Record Number: [id number] Patient Account Number: [id number] Date of Birth/Sex: 11/07/1961 (54 y.o. Male) Treating RN: Baruch Gouty, RN, BSN, Velva Harman Primary Care Physician: Frazier Richards Other Clinician: Referring Physician: Frazier Richards Treating Physician/Extender: Frann Rider in Treatment: 2 Active Inactive Venous Leg Ulcer Nursing  Diagnoses: Actual venous Insuffiency (use after diagnosis is confirmed) Goals: Patient will maintain optimal edema control Date Initiated: 06/11/2015 Goal Status: Active Patient/caregiver will verbalize understanding of disease process and disease management Date Initiated: 06/11/2015 Goal Status: Active Interventions: Assess peripheral edema status every visit. Compression as ordered Notes: Wound/Skin Impairment Nursing Diagnoses: Impaired tissue integrity Goals: Ulcer/skin breakdown will have a volume reduction of 30% by week 4 Date Initiated: 06/11/2015 Goal Status: Active Ulcer/skin breakdown will have a volume reduction of 50% by week 8 Date Initiated: 06/11/2015 Goal Status: Active Ulcer/skin breakdown will have a volume reduction of 80% by week 12 Date Initiated: 06/11/2015 Goal Status: Active Ulcer/skin breakdown will heal within 14 weeks Date Initiated: 06/11/2015 Widmer, [name] ([id number]) Goal Status: Active Interventions: Assess patient/caregiver ability to perform ulcer/skin care regimen upon admission and as needed Assess ulceration(s) every visit Notes: Electronic Signature(s) Signed: 06/25/2015 2:58:07 PM By: Regan Lemming BSN, RN Entered By: Regan Lemming on 06/25/2015 14:58:07 Bourbeau, Keino [name]. ([id number]) -------------------------------------------------------------------------------- Pain Assessment Details Patient Name: [name], [name]. Date of Service: 06/25/2015 2:30 PM Medical Record Number: [id number] Patient Account Number: [id number] Date of Birth/Sex: 28-Mar-1961 (54 y.o. Male) Treating RN: Baruch Gouty, RN, BSN, Velva Harman Primary Care Physician: Frazier Richards Other Clinician: Referring Physician: Frazier Richards Treating Physician/Extender: Frann Rider in  Treatment: 2 Active Problems Location of Pain Severity and Description of Pain Patient Has Paino No Site Locations Pain Management and Medication Current Pain Management: Electronic  Signature(s) Signed: 06/25/2015 2:45:42 PM By: Regan Lemming BSN, RN Entered By: Regan Lemming on 06/25/2015 14:45:42 Meulemans, Maycol [name]. ([id number]) -------------------------------------------------------------------------------- Patient/Caregiver Education Details Patient Name: [name], [name]. Date of Service: 06/25/2015 2:30 PM Medical Record Number: [id number] Patient Account Number: [id number] Date of Birth/Gender: 12/23/1960 (54 y.o. Male) Treating RN: Baruch Gouty, RN, BSN, Velva Harman Primary Care Physician: Frazier Richards Other Clinician: Referring Physician: Frazier Richards Treating Physician/Extender: Frann Rider in Treatment: 2 Education Assessment Education Provided To: Patient Education Topics Provided Basic Hygiene: Methods: Explain/Verbal Responses: State content correctly Wound/Skin Impairment: Methods: Explain/Verbal Responses: State content correctly Electronic Signature(s) Signed: 06/25/2015 3:24:21 PM By: Regan Lemming BSN, RN Entered By: Regan Lemming on 06/25/2015 15:24:21 [place], [name] ([id number]) -------------------------------------------------------------------------------- Wound Assessment Details Patient Name: Aitken, [name]. Date of Service: 06/25/2015 2:30 PM Medical Record Number: [id number] Patient Account Number: [id number] Date of Birth/Sex: 1961/06/02 (54 y.o. Male) Treating RN: Baruch Gouty, RN, BSN, [place] Primary Care Physician: Frazier Richards Other Clinician: Referring Physician: Frazier Richards Treating Physician/Extender: Frann Rider in Treatment: 2 Wound Status Wound Number: 3 Primary Diabetic Wound/Ulcer of the Lower Etiology: Extremity Wound Location: Left Lower Leg - Medial Secondary Venous Leg Ulcer Wounding Event: Gradually Appeared Etiology: Date Acquired: 04/11/2015 Wound Status: Open Weeks Of Treatment: 2 Comorbid Hypertension, Type II Diabetes, Clustered Wound: No History: Osteoarthritis, Neuropathy Photos Photo  Uploaded By: Regan Lemming on 06/25/2015 16:04:23 Wound Measurements Length: (cm) 3.5 Width: (cm) 5.6 Depth: (cm) 0.2 Area: (cm) 15.394 Volume: (cm) 3.079 % Reduction in Area: -7.7% % Reduction in Volume: 28.2% Epithelialization: Medium (34-66%) Tunneling: No Undermining: No Wound Description Classification: Grade 2 Foul Odor Aft Wound Margin: Flat and Intact Exudate Amount: Medium Exudate Type: Serous Exudate Color: amber er Cleansing: No Wound Bed Granulation Amount: Medium (34-66%) Exposed Structure Granulation Quality: Pink, Pale Fascia Exposed: No Necrotic Amount: Medium (34-66%) Fat Layer Exposed: No Necrotic Quality: Adherent Slough Tendon Exposed: No Havey, [name]. ([id number]) Muscle Exposed: No Joint Exposed: No Bone Exposed: No Limited to Skin Breakdown Periwound Skin Texture Texture Color No Abnormalities Noted: No No Abnormalities Noted: No Callus: No Atrophie Blanche: No Crepitus: No Cyanosis: No Excoriation: No Ecchymosis: No Fluctuance: No Erythema: No Friable: No Hemosiderin Staining: No Induration: No Mottled: No Localized Edema: No Pallor: No Rash: No Rubor: No Scarring: No Temperature / Pain Moisture Temperature: No Abnormality No Abnormalities Noted: No Tenderness on Palpation: Yes Dry / Scaly: No Maceration: No Moist: Yes Wound Preparation Ulcer Cleansing: Other: soap and water, Topical Anesthetic Applied: Other: lidocaine 4%, Treatment Notes Wound #3 (Left, Medial Lower Leg) 1. Cleansed with: Cleanse wound with antibacterial soap and water May Shower, gently pat wound dry prior to applying new dressing. May shower with protection 2. Anesthetic Topical Lidocaine 4% cream to wound bed prior to debridement 4. Dressing Applied: Santyl Ointment 5. Secondary Dressing Applied ABD Pad 7. Secured with Rolena Infante to Left Lower Extremity Electronic Signature(s) Signed: 06/25/2015 2:56:35 PM By: Regan Lemming BSN, RN Entered  By: Regan Lemming on 06/25/2015 14:56:34 Hutt, [name]. ([id number]) Brandau, [name]. ([id number]) -------------------------------------------------------------------------------- Wound Assessment Details Patient Name: Ravelo, [name]. Date of Service: 06/25/2015 2:30 PM Medical Record Number: [id number] Patient Account Number: [id number] Date of Birth/Sex: 1961-08-27 (54 y.o. Male) Treating RN: Baruch Gouty, RN, BSN, Velva Harman Primary Care Physician: Frazier Richards Other Clinician: Referring Physician: Frazier Richards Treating Physician/Extender: Con Memos,  Errol Weeks in Treatment: 2 Wound Status Wound Number: 4 Primary Diabetic Wound/Ulcer of the Lower Etiology: Extremity Wound Location: Left Lower Leg - Lateral Secondary Venous Leg Ulcer Wounding Event: Gradually Appeared Etiology: Date Acquired: 04/11/2015 Wound Status: Open Weeks Of Treatment: 2 Comorbid Hypertension, Type II Diabetes, Clustered Wound: No History: Osteoarthritis, Neuropathy Photos Photo Uploaded By: Regan Lemming on 06/25/2015 16:05:00 Wound Measurements Length: (cm) 0.6 Width: (cm) 0.5 Depth: (cm) 0.3 Area: (cm) 0.236 Volume: (cm) 0.071 % Reduction in Area: -50.3% % Reduction in Volume: -343.7% Tunneling: No Undermining: No Wound Description Classification: Unable to visualize wound bed Foul Odor Aft Wound Margin: Indistinct, nonvisible Exudate Amount: Medium Exudate Type: Serosanguineous Exudate Color: red, brown er Cleansing: No Wound Bed Granulation Amount: Large (67-100%) Exposed Structure Granulation Quality: Red Fascia Exposed: No Necrotic Amount: Small (1-33%) Fat Layer Exposed: No Necrotic Quality: Adherent Slough Tendon Exposed: No Scho[place]ck, [name]. ([id number]) Muscle Exposed: No Joint Exposed: No Bone Exposed: No Limited to Skin Breakdown Periwound Skin Texture Texture Color No Abnormalities Noted: No No Abnormalities Noted: No Callus: No Atrophie Blanche: No Crepitus:  No Cyanosis: No Excoriation: No Ecchymosis: No Fluctuance: No Erythema: No Friable: No Hemosiderin Staining: No Induration: No Mottled: No Localized Edema: No Pallor: No Rash: No Rubor: No Scarring: No Temperature / Pain Moisture Temperature: No Abnormality No Abnormalities Noted: No Tenderness on Palpation: Yes Dry / Scaly: No Maceration: No Moist: Yes Wound Preparation Ulcer Cleansing: Other: soap and water, Topical Anesthetic Applied: Other: lidocaine 4%, Treatment Notes Wound #4 (Left, Lateral Lower Leg) 1. Cleansed with: Cleanse wound with antibacterial soap and water May Shower, gently pat wound dry prior to applying new dressing. May shower with protection 2. Anesthetic Topical Lidocaine 4% cream to wound bed prior to debridement 4. Dressing Applied: Santyl Ointment 5. Secondary Dressing Applied ABD Pad 7. Secured with Rolena Infante to Left Lower Extremity Electronic Signature(s) Signed: 06/25/2015 2:57:23 PM By: Regan Lemming BSN, RN Entered By: Regan Lemming on 06/25/2015 14:57:23 Ballen, Lan [name]. ([id number]) Cassedy, Federico [name]. ([id number]) -------------------------------------------------------------------------------- Vitals Details Patient Name: [name], Venus [name]. Date of Service: 06/25/2015 2:30 PM Medical Record Number: [id number] Patient Account Number: [id number] Date of Birth/Sex: 19-Jun-1961 (54 y.o. Male) Treating RN: Baruch Gouty, RN, BSN, [place] Primary Care Physician: Frazier Richards Other Clinician: Referring Physician: Frazier Richards Treating Physician/Extender: Frann Rider in Treatment: 2 Vital Signs Time Taken: 14:45 Temperature (F): 98.4 Height (in): 74 Pulse (bpm): 87 Weight (lbs): 260 Respiratory Rate (breaths/min): 18 Body Mass Index (BMI): 33.4 Blood Pressure (mmHg): 191/75 Reference Range: 80 - 120 mg / dl Electronic Signature(s) Signed: 06/25/2015 2:46:59 PM By: Regan Lemming BSN, RN Entered By: Regan Lemming on 06/25/2015  14:46:59

## 2015-06-27 NOTE — Progress Notes (Signed)
[name], [name] ([id number]) Visit Report for 06/25/2015 Chief Complaint Document Details Patient Name: [name], [name]. Date of Service: 06/25/2015 2:30 PM Medical Record Number: [id number] Patient Account Number: [id number] Date of Birth/Sex: [birth year] (54 y.o. Male) Treating RN: Cornell Barman Primary Care Physician: Frazier Richards Other Clinician: Referring Physician: Frazier Richards Treating Physician/Extender: Frann Rider in Treatment: 2 Information Obtained from: Patient Chief Complaint Patient presents for treatment of an open ulcer due to venous insufficiency. He has had a recurrent left lower extremity ulceration since about 2 months Electronic Signature(s) Signed: 06/25/2015 3:21:03 PM By: Christin Fudge MD, FACS Entered By: Christin Fudge on 06/25/2015 15:21:03 Fahy, Kester J. ([id number]) -------------------------------------------------------------------------------- Debridement Details Patient Name: [name], [name] Date of Service: 06/25/2015 2:30 PM Medical Record Number: [id number] Patient Account Number: [id number] Date of Birth/Sex: 03/14/1961 (54 y.o. Male) Treating RN: Cornell Barman Primary Care Physician: Frazier Richards Other Clinician: Referring Physician: Frazier Richards Treating Physician/Extender: Frann Rider in Treatment: 2 Debridement Performed for Wound #3 Left,Medial Lower Leg Assessment: Performed By: Physician Pat Patrick., MD Debridement: Debridement Pre-procedure Yes Verification/Time Out Taken: Start Time: 15:05 Pain Control: Lidocaine 4% Topical Solution Level: Skin/Subcutaneous Tissue Total Area Debrided (L x 3.5 (cm) x 5.6 (cm) = 19.6 (cm) W): Tissue and other Viable, Non-Viable, Exudate, Fibrin/Slough, Subcutaneous material debrided: Instrument: Curette Bleeding: Minimum Hemostasis Achieved: Pressure End Time: 15:10 Procedural Pain: 0 Post Procedural Pain: 0 Response to Treatment: Procedure was  tolerated well Post Debridement Measurements of Total Wound Length: (cm) 3.5 Width: (cm) 5.6 Depth: (cm) 0.2 Volume: (cm) 3.079 Electronic Signature(s) Signed: 06/25/2015 3:20:55 PM By: Christin Fudge MD, FACS Signed: 06/26/2015 5:06:50 PM By: Gretta Cool RN, BSN, Kim RN, BSN Previous Signature: 06/25/2015 3:11:56 PM Version By: Regan Lemming BSN, RN Entered By: Christin Fudge on 06/25/2015 15:20:55 Scalese, Nolton J. ([id number]) -------------------------------------------------------------------------------- Debridement Details Patient Name: [name], [name] Date of Service: 06/25/2015 2:30 PM Medical Record Number: [id number] Patient Account Number: [id number] Date of Birth/Sex: 1960-11-19 (54 y.o. Male) Treating RN: Cornell Barman Primary Care Physician: Frazier Richards Other Clinician: Referring Physician: Frazier Richards Treating Physician/Extender: Frann Rider in Treatment: 2 Debridement Performed for Wound #4 Left,Lateral Lower Leg Assessment: Performed By: Physician Pat Patrick., MD Debridement: Debridement Pre-procedure Yes Verification/Time Out Taken: Start Time: 15:05 Pain Control: Lidocaine 4% Topical Solution Level: Skin/Subcutaneous Tissue Total Area Debrided (L x 0.6 (cm) x 0.5 (cm) = 0.3 (cm) W): Tissue and other Viable, Non-Viable, Exudate, Fibrin/Slough, Subcutaneous material debrided: Instrument: Curette Bleeding: Minimum Hemostasis Achieved: Pressure End Time: 15:10 Procedural Pain: 0 Post Procedural Pain: 0 Response to Treatment: Procedure was tolerated well Post Debridement Measurements of Total Wound Length: (cm) 0.6 Width: (cm) 0.5 Depth: (cm) 0.3 Volume: (cm) 0.071 Electronic Signature(s) Signed: 06/25/2015 3:21:55 PM By: Christin Fudge MD, FACS Signed: 06/26/2015 5:06:50 PM By: Gretta Cool RN, BSN, Kim RN, BSN Previous Signature: 06/25/2015 3:21:40 PM Version By: Christin Fudge MD, FACS Entered By: Christin Fudge on 06/25/2015  15:21:55 Walters, Yuvin J. ([id number]) -------------------------------------------------------------------------------- HPI Details Patient Name: [name], [name] Date of Service: 06/25/2015 2:30 PM Medical Record Number: [id number] Patient Account Number: [id number] Date of Birth/Sex: [birth year] (54 y.o. Male) Treating RN: Cornell Barman Primary Care Physician: Frazier Richards Other Clinician: Referring Physician: Frazier Richards Treating Physician/Extender: Frann Rider in Treatment: 2 History of Present Illness Location: left lower extremity medial and lateral Quality: Patient reports experiencing a dull pain to affected area(s). Severity: Patient states wound are getting worse. Duration: Patient has had the wound for > 2 months  prior to seeking treatment at the wound center Timing: Pain in wound is Intermittent (comes and goes Context: The wound appeared gradually over time Modifying Factors: Testing to this date include ABI, Waveforms, Venous studies Associated Signs and Symptoms: Patient reports having difficulty standing for long periods. HPI Description: he had endovenous ablation of his left lower extremity about 2 months ago and soon after that he noted that there was another ulceration on his left lower extremity. He did not seek medical help for very social reasons and now this has progressively gotten worse. 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 Last hemoglobin A1c on 12/06/2014 was 6.4 The patient is a pleasant 54 year old with a past medical history significant for diabetes, peripheral neuropathy, and chronic venous stasis disease. No history of DVT. No peripheral vascular disease (ABI done at [facility] showed the left to be 1.30 in the right to be 1.31). Ambulating normally per his baseline. 06/18/2015 -- has been coming for twice a  week dressing changes and has been doing fine otherwise. Electronic Signature(s) Signed: 06/25/2015 3:21:10 PM By: Christin Fudge MD, FACS Entered By: Christin Fudge on 06/25/2015 15:21:10 Ofarrell, Lucille JMarland Kitchen ([id number]) -------------------------------------------------------------------------------- Physical Exam Details Patient Name: [name], [name] Date of Service: 06/25/2015 2:30 PM Medical Record Number: [id number] Patient Account Number: [id number] Date of Birth/Sex: 17-May-1961 (54 y.o. Male) Treating RN: Cornell Barman Primary Care Physician: Frazier Richards Other Clinician: Referring Physician: Frazier Richards Treating Physician/Extender: Frann Rider in Treatment: 2 Constitutional . Pulse regular. Respirations normal and unlabored. Afebrile. . Eyes Nonicteric. Reactive to light. Ears, Nose, Mouth, and Throat Lips, teeth, and gums WNL.[name] Moist mucosa without lesions . Neck supple and nontender. No palpable supraclavicular or cervical adenopathy. Normal sized without goiter. Respiratory WNL. No retractions.. Cardiovascular Pedal Pulses WNL. No clubbing, cyanosis or edema. Lymphatic No adneopathy. No adenopathy. No adenopathy. Musculoskeletal Adexa without tenderness or enlargement.. Digits and nails w/o clubbing, cyanosis, infection, petechiae, ischemia, or inflammatory conditions.. Integumentary (Hair, Skin) No suspicious lesions. No crepitus or fluctuance. No peri-wound warmth or erythema. No masses.[name] Psychiatric Judgement and insight Intact.. No evidence of depression, anxiety, or agitation.. Notes Both the wounds looked much cleaner but have some slough which need sharp debridement with a curette Electronic Signature(s) Signed: 06/25/2015 3:22:27 PM By: Christin Fudge MD, FACS Entered By: Christin Fudge on 06/25/2015 15:22:27 Penman, Jermie Lenna Sciara ([id number]) -------------------------------------------------------------------------------- Physician Orders  Details Patient Name: [name], [name] Date of Service: 06/25/2015 2:30 PM Medical Record Number: [id number] Patient Account Number: [id number] Date of Birth/Sex: [birth year] (54 y.o. Male) Treating RN: Baruch Gouty, RN, BSN, [place] Primary Care Physician: Frazier Richards Other Clinician: Referring Physician: Frazier Richards Treating Physician/Extender: Frann Rider in Treatment: 2 Verbal / Phone Orders: Yes Clinician: Afful, RN, BSN, Rita Read Back and Verified: Yes Diagnosis Coding Wound Cleansing Wound #3 Left,Medial Lower Leg o Cleanse wound with mild soap and water Wound #4 Left,Lateral Lower Leg o Cleanse wound with mild soap and water Anesthetic Wound #3 Left,Medial Lower Leg o Topical Lidocaine 4% cream applied to wound bed prior to debridement Wound #4 Left,Lateral Lower Leg o Topical Lidocaine 4% cream applied to wound bed prior to debridement Primary Wound Dressing Wound #3 Left,Medial Lower Leg o Santyl Ointment Wound #4 Left,Lateral Lower Leg o Santyl Ointment Secondary Dressing Wound #3 Left,Medial Lower Leg o ABD pad Wound #4 Left,Lateral Lower Leg o ABD pad Dressing Change Frequency Wound #3 Left,Medial Lower  Leg o Other: - twice weekly - Tuesday and Friday Wound #4 Left,Lateral Lower Leg o Other: - twice weekly - Tuesday and Friday Follow-up Appointments Wanless, Alberta J. ([id number]) Wound #3 Left,Medial Lower Leg o Return Appointment in 1 week. o Nurse Visit as needed - Friday for rewrap Wound #4 Left,Lateral Lower Leg o Return Appointment in 1 week. Edema Control Wound #3 Left,Medial Lower Leg o Unna Boot to Left Lower Extremity Wound #4 Left,Lateral Lower Leg o Unna Boot to Left Lower Extremity Electronic Signature(s) Signed: 06/25/2015 3:23:19 PM By: Regan Lemming BSN, RN Signed: 06/25/2015 3:28:23 PM By: Christin Fudge MD, FACS Entered By: Regan Lemming on 06/25/2015 15:23:19 Mutch, Upton J.  ([id number]) -------------------------------------------------------------------------------- Problem List Details Patient Name: [name], [name] Date of Service: 06/25/2015 2:30 PM Medical Record Number: [id number] Patient Account Number: [id number] Date of Birth/Sex: 11-19-1960 (54 y.o. 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 ([id number]) -------------------------------------------------------------------------------- Progress Note Details Patient Name: [name], [name] Date of Service: 06/25/2015 2:30 PM Medical Record Number: [id number] Patient Account Number: [id number] Date of Birth/Sex: 1961-07-18 (54 y.o. Male) Treating RN: Cornell Barman Primary Care Physician: Frazier Richards Other Clinician: Referring Physician: Frazier Richards Treating Physician/Extender: Frann Rider in Treatment: 2 Subjective Chief Complaint Information obtained from Patient Patient presents for treatment of an open ulcer due to venous insufficiency. He has had a recurrent left lower extremity ulceration since about 2 months History of Present Illness (HPI) The following HPI elements were documented for the patient's wound: Location: left lower extremity medial and lateral Quality: Patient  reports experiencing a dull pain to affected area(s). Severity: Patient states wound are getting worse. Duration: Patient has had the wound for > 2 months prior to seeking treatment at the wound center Timing: Pain in wound is Intermittent (comes and goes Context: The wound appeared gradually over time Modifying Factors: Testing to this date include ABI, Waveforms, Venous studies Associated Signs and Symptoms: Patient reports having difficulty standing for long periods. he had endovenous ablation of his left lower extremity about 2 months ago and soon after that he noted that there was another ulceration on his left lower extremity. He did not seek medical help for very social reasons and now this has progressively gotten worse. 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 Last hemoglobin A1c on 12/06/2014 was 6.4 The patient is a pleasant 54 year old with a past medical history significant for diabetes, peripheral neuropathy, and chronic venous stasis disease. No history of DVT. No peripheral vascular disease (ABI done at [facility] showed the left to be 1.30 in the right to be 1.31). Ambulating normally per his baseline. 06/18/2015 -- has been coming for twice a week dressing changes and has been doing fine otherwise. Objective Constitutional [name], [name] ([id number]) Pulse regular. Respirations normal and unlabored. Afebrile. Vitals Time Taken: 2:45 PM, Height: 74 in, Weight: 260  lbs, BMI: 33.4, Temperature: 98.4 F, Pulse: 87 bpm, Respiratory Rate: 18 breaths/min, Blood Pressure: 191/75 mmHg. Eyes Nonicteric. Reactive to light. Ears, Nose, Mouth, and Throat Lips, teeth, and gums WNL.[name] Moist mucosa without lesions . Neck supple and nontender. No palpable supraclavicular or cervical adenopathy. Normal sized without goiter. Respiratory WNL. No  retractions.. Cardiovascular Pedal Pulses WNL. No clubbing, cyanosis or edema. Lymphatic No adneopathy. No adenopathy. No adenopathy. Musculoskeletal Adexa without tenderness or enlargement.. Digits and nails w/o clubbing, cyanosis, infection, petechiae, ischemia, or inflammatory conditions.[name] Psychiatric Judgement and insight Intact.. No evidence of depression, anxiety, or agitation.. General Notes: Both the wounds looked much cleaner but have some slough which need sharp debridement with a curette Integumentary (Hair, Skin) No suspicious lesions. No crepitus or fluctuance. No peri-wound warmth or erythema. No masses.. Wound #3 status is Open. Original cause of wound was Gradually Appeared. The wound is located on the Left,Medial Lower Leg. The wound measures 3.5cm length x 5.6cm width x 0.2cm depth; 15.394cm^2 area and 3.079cm^3 volume. The wound is limited to skin breakdown. There is no tunneling or undermining noted. There is a medium amount of serous drainage noted. The wound margin is flat and intact. There is medium (34-66%) pink, pale granulation within the wound bed. There is a medium (34-66%) amount of necrotic tissue within the wound bed including Adherent Slough. The periwound skin appearance exhibited: Moist. The periwound skin appearance did not exhibit: Callus, Crepitus, Excoriation, Fluctuance, Friable, Induration, Localized Edema, Rash, Scarring, Dry/Scaly, Maceration, Atrophie Blanche, Cyanosis, Ecchymosis, Hemosiderin Staining, Mottled, Pallor, Rubor, Erythema. Periwound temperature was noted as No Abnormality. The periwound has tenderness on palpation. Wound #4 status is Open. Original cause of wound was Gradually Appeared. The wound is located on the Left,Lateral Lower Leg. The wound measures 0.6cm length x 0.5cm width x 0.3cm depth; 0.236cm^2 area [name], [name] ([id number]) and 0.071cm^3 volume. The wound is limited to skin breakdown. There is no tunneling or  undermining noted. There is a medium amount of serosanguineous drainage noted. The wound margin is indistinct and nonvisible. There is large (67-100%) red granulation within the wound bed. There is a small (1-33%) amount of necrotic tissue within the wound bed including Adherent Slough. The periwound skin appearance exhibited: Moist. The periwound skin appearance did not exhibit: Callus, Crepitus, Excoriation, Fluctuance, Friable, Induration, Localized Edema, Rash, Scarring, Dry/Scaly, Maceration, Atrophie Blanche, Cyanosis, Ecchymosis, Hemosiderin Staining, Mottled, Pallor, Rubor, Erythema. Periwound temperature was noted as No Abnormality. The periwound has tenderness on palpation. 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 We will continue with Santyl ointment and a Unna's boots but we can do this once a week now. Will come back and see me next Tuesday. Procedures Wound #3 Wound #3 is a Diabetic Wound/Ulcer of the Lower Extremity located on the Left,Medial Lower Leg . There was a Skin/Subcutaneous Tissue Debridement ([id number]) debridement with total area of 19.6 sq cm performed by Pat Patrick., MD. with the following instrument(s): Curette to remove Viable and Non-Viable tissue/material including Exudate, Fibrin/Slough, and Subcutaneous after achieving pain control using Lidocaine 4% Topical Solution. A time out was conducted prior to the start of the procedure. A Minimum amount of bleeding was controlled with Pressure. The procedure was tolerated  well with a pain level of 0 throughout and a pain level of 0 following the procedure. Post Debridement Measurements: 3.5cm length  x 5.6cm width x 0.2cm depth; 3.079cm^3 volume. Wound #4 Wound #4 is a Diabetic Wound/Ulcer of the Lower Extremity located on the Left,Lateral Lower Leg . There was a Skin/Subcutaneous Tissue Debridement ([id number]) debridement with total area of 0.3 sq cm performed by Pat Patrick., MD. with the following instrument(s): Curette to remove Viable and Non-Viable tissue/material including Exudate, Fibrin/Slough, and Subcutaneous after achieving pain control using Lidocaine 4% Topical Solution. A time out was conducted prior to the start of the procedure. A Minimum [name], [name] ([id number]) amount of bleeding was controlled with Pressure. The procedure was tolerated well with a pain level of 0 throughout and a pain level of 0 following the procedure. Post Debridement Measurements: 0.6cm length x 0.5cm width x 0.3cm depth; 0.071cm^3 volume. Plan The patient has improved and the wound looks much cleaner than before. We will continue with Santyl ointment and a Unna's boots but we can do this once a week now. Will come back and see me next Tuesday. Electronic Signature(s) Signed: 06/25/2015 3:23:21 PM By: Christin Fudge MD, FACS Entered By: Christin Fudge on 06/25/2015 15:23:21 [name], [name] Lenna Sciara ([id number]) -------------------------------------------------------------------------------- SuperBill Details Patient Name: [name], Neshawn J. Date of Service: 06/25/2015 Medical Record Number: [id number] Patient Account Number: [id number] Date of Birth/Sex: July 06, 1961 (54 y.o. 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 I87.2 Venous insufficiency (chronic) (peripheral) L97.222  Non-pressure chronic ulcer of left calf with fat layer exposed Facility Procedures CPT4 Code: [id number] Description: 80321 - DEB SUBQ TISSUE 20 SQ CM/< ICD-10 Description Diagnosis E11.622 Type 2 diabetes mellitus 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 I87.2 Venous insufficiency (chronic) (peripheral) L97.222  Non-pressure chronic ulcer of left calf with fat l Modifier: of calf ayer exposed Quantity: 1 Physician Procedures CPT4 Code: 2248250 Description: 03704 - WC PHYS SUBQ TISS 20 SQ CM ICD-10 Description Diagnosis E11.622 Type 2 diabetes mellitus 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 I87.2 Venous insufficiency (chronic) (peripheral) L97.222  Non-pressure chronic ulcer of left calf with fat l Modifier: of calf ayer exposed Quantity: 1 Electronic Signature(s) Signed: 06/25/2015 3:23:35 PM By: Christin Fudge MD, FACS Entered By: Christin Fudge on 06/25/2015 15:23:35

## 2015-07-02 ENCOUNTER — Encounter: Payer: BLUE CROSS/BLUE SHIELD | Admitting: Surgery

## 2015-07-02 DIAGNOSIS — L97222 Non-pressure chronic ulcer of left calf with fat layer exposed: Secondary | ICD-10-CM | POA: Diagnosis not present

## 2015-07-02 NOTE — Progress Notes (Signed)
[name], GERDEMAN ([id number]) Visit Report for 07/02/2015 Arrival Information Details Patient Name: [name], [name]. Date of Service: 07/02/2015 9:30 AM Medical Record Number: [id number] Patient Account Number: [id number] Date of Birth/Sex: 19-Nov-1960 (54 y.o. Male) Treating RN: Baruch Gouty, RN, BSN, Velva Harman Primary Care Physician: Frazier Richards Other Clinician: Referring Physician: Frazier Richards Treating Physician/Extender: Frann Rider in Treatment: 3 Visit Information History Since Last Visit Added or deleted any medications: No Patient Arrived: Ambulatory Any new allergies or adverse reactions: No Arrival Time: 09:45 Had a fall or experienced change in No Accompanied By: self activities of daily living that may affect Transfer Assistance: None risk of falls: Patient Identification Verified: Yes Signs or symptoms of abuse/neglect since last No Secondary Verification Process Yes visito Completed: Hospitalized since last visit: No Patient Has Alerts: Yes Has Dressing in Place as Prescribed: Yes Has Compression in Place as Prescribed: No Pain Present Now: No Electronic Signature(s) Signed: 07/02/2015 9:48:25 AM By: Regan Lemming BSN, RN Entered By: Regan Lemming on 07/02/2015 09:48:25 Knittel, Khan J. ([id number]) -------------------------------------------------------------------------------- Encounter Discharge Information Details Patient Name: [name], [name] Date of Service: 07/02/2015 9:30 AM Medical Record Number: [id number] Patient Account Number: [id number] Date of Birth/Sex: [birth year] (54 y.o. Male) Treating RN: Baruch Gouty, RN, BSN, Velva Harman Primary Care Physician: Frazier Richards Other Clinician: Referring Physician: Frazier Richards Treating Physician/Extender: Frann Rider in Treatment: 3 Encounter Discharge Information Items Discharge Pain Level: 0 Discharge Condition: Stable Ambulatory Status: Ambulatory Discharge Destination:  Home Private Transportation: Auto Accompanied By: self Schedule Follow-up Appointment: No Medication Reconciliation completed and No provided to Patient/Care Mariella Blackwelder: Clinical Summary of Care: Electronic Signature(s) Signed: 07/02/2015 10:11:08 AM By: Regan Lemming BSN, RN Entered By: Regan Lemming on 07/02/2015 10:11:07 Messimer, Kaine Lenna Sciara ([id number]) -------------------------------------------------------------------------------- Lower Extremity Assessment Details Patient Name: Schreur, [name] Date of Service: 07/02/2015 9:30 AM Medical Record Number: [id number] Patient Account Number: [id number] Date of Birth/Sex: August 18, 1961 (54 y.o. Male) Treating RN: Baruch Gouty, RN, BSN, Velva Harman Primary Care Physician: Frazier Richards Other Clinician: Referring Physician: Frazier Richards Treating Physician/Extender: Frann Rider in Treatment: 3 Edema Assessment Assessed: Shirlyn Goltz: No] [Right: No] E[Left: dema] [Right: :] Calf Left: Right: Point of Measurement: 34 cm From Medial Instep 36.7 cm cm Ankle Left: Right: Point of Measurement: 10 cm From Medial Instep 26.5 cm cm Vascular Assessment Claudication: Claudication Assessment [Left:None] Pulses: Posterior Tibial Dorsalis Pedis Palpable: [Left:Yes] Extremity colors, hair growth, and conditions: Extremity Color: [Left:Normal] Hair Growth on Extremity: [Left:Yes] Temperature of Extremity: [Left:Warm] Capillary Refill: [Left:< 3 seconds] Toe Nail Assessment Left: Right: Thick: No Discolored: No Deformed: No Improper Length and Hygiene: No Electronic Signature(s) Signed: 07/02/2015 9:53:57 AM By: Regan Lemming BSN, RN Entered By: Regan Lemming on 07/02/2015 09:53:57 [place], Logun J. ([id number]) Goldenstein, Aqil J. ([id number]) -------------------------------------------------------------------------------- Multi Wound Chart Details Patient Name: Abeln, [name] Date of Service: 07/02/2015 9:30 AM Medical Record Number:  [id number] Patient Account Number: [id number] Date of Birth/Sex: 06-10-1961 (54 y.o. Male) Treating RN: Baruch Gouty, RN, BSN, Velva Harman Primary Care Physician: Frazier Richards Other Clinician: Referring Physician: Frazier Richards Treating Physician/Extender: Frann Rider in Treatment: 3 Vital Signs Height(in): 74 Pulse(bpm): 86 Weight(lbs): 260 Blood Pressure 188/89 (mmHg): Body Mass Index(BMI): 33 Temperature(F): 98.2 Respiratory Rate 16 (breaths/min): Photos: [3:No Photos] [4:No Photos] [N/A:N/A] Wound Location: [3:Left Lower Leg - Medial] [4:Left Lower Leg - Lateral] [N/A:N/A] Wounding Event: [3:Gradually Appeared] [4:Gradually Appeared] [N/A:N/A] Primary Etiology: [3:Diabetic Wound/Ulcer of the Lower Extremity] [4:Diabetic Wound/Ulcer of the Lower Extremity] [N/A:N/A] 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] [4:Venous Leg Ulcer] [N/A:N/A] Comorbid  History: [3:Hypertension, Type II Diabetes, Osteoarthritis, Neuropathy] [4:Hypertension, Type II Diabetes, Osteoarthritis, Neuropathy] [N/A:N/A] Date Acquired: [3:04/11/2015] [4:04/11/2015] [N/A:N/A] Weeks of Treatment: [3:3] [4:3] [N/A:N/A] Wound Status: [3:Open] [4:Open] [N/A:N/A] Measurements L x W x D 3.4x6x0.1 [4:0.5x0.5x0.1] [N/A:N/A] (cm) Area (cm) : [3:16.022] [4:0.196] [N/A:N/A] Volume (cm) : [3:1.602] [4:0.02] [N/A:N/A] % Reduction in Area: [3:-12.10%] [4:-24.80%] [N/A:N/A] % Reduction in Volume: 62.60% [4:-25.00%] [N/A:N/A] Classification: [3:Grade 2] [4:Unable to visualize wound N/A bed] Exudate Amount: [3:Medium] [4:Small] [N/A:N/A] Exudate Type: [3:Serous] [4:Serosanguineous] [N/A:N/A] Exudate Color: [3:amber] [4:red, brown] [N/A:N/A] Wound Margin: [3:Flat and Intact] [4:Indistinct, nonvisible] [N/A:N/A] Granulation Amount: [3:Medium (34-66%)] [4:None Present (0%)] [N/A:N/A] Granulation Quality: [3:Pink, Pale] [4:N/A] [N/A:N/A] Necrotic Amount: [3:Medium (34-66%)] [4:Large (67-100%)] [N/A:N/A] Exposed  Structures: [3:Fascia: No Fat: No Tendon: No] [4:Fascia: No Fat: No Tendon: No] [N/A:N/A] Muscle: No Muscle: No Joint: No Joint: No Bone: No Bone: No Limited to Skin Limited to Skin Breakdown Breakdown Epithelialization: Medium (34-66%) Medium (34-66%) N/A Periwound Skin Texture: Edema: No Edema: No N/A Excoriation: No Excoriation: No Induration: No Induration: No Callus: No Callus: No Crepitus: No Crepitus: No Fluctuance: No Fluctuance: No Friable: No Friable: No Rash: No Rash: No Scarring: No Scarring: No Periwound Skin Moist: Yes Moist: Yes N/A Moisture: Maceration: No Maceration: No Dry/Scaly: No Dry/Scaly: No Periwound Skin Color: Atrophie Blanche: No Atrophie Blanche: No N/A Cyanosis: No Cyanosis: No Ecchymosis: No Ecchymosis: No Erythema: No Erythema: No Hemosiderin Staining: No Hemosiderin Staining: No Mottled: No Mottled: No Pallor: No Pallor: No Rubor: No Rubor: No Temperature: No Abnormality No Abnormality N/A Tenderness on Yes Yes N/A Palpation: Wound Preparation: Ulcer Cleansing: Other: Ulcer Cleansing: Other: N/A soap and water soap and water Topical Anesthetic Topical Anesthetic Applied: Other: lidocaine Applied: Other: lidocaine 4% 4% Treatment Notes Electronic Signature(s) Signed: 07/02/2015 9:58:48 AM By: Regan Lemming BSN, RN Entered By: Regan Lemming on 07/02/2015 09:58:48 Pereyra, Marcell JMarland Kitchen ([id number]) -------------------------------------------------------------------------------- Multi-Disciplinary Care Plan Details Patient Name: [name], Norrin J. Date of Service: 07/02/2015 9:30 AM Medical Record Number: [id number] Patient Account Number: [id number] Date of Birth/Sex: 1961/07/18 (54 y.o. Male) Treating RN: Baruch Gouty, RN, BSN, Velva Harman Primary Care Physician: Frazier Richards Other Clinician: Referring Physician: Frazier Richards Treating Physician/Extender: Frann Rider in Treatment: 3 Active Inactive Venous Leg  Ulcer Nursing Diagnoses: Actual venous Insuffiency (use after diagnosis is confirmed) Goals: Patient will maintain optimal edema control Date Initiated: 06/11/2015 Goal Status: Active Patient/caregiver will verbalize understanding of disease process and disease management Date Initiated: 06/11/2015 Goal Status: Active Interventions: Assess peripheral edema status every visit. Compression as ordered Notes: Wound/Skin Impairment Nursing Diagnoses: Impaired tissue integrity Goals: Ulcer/skin breakdown will have a volume reduction of 30% by week 4 Date Initiated: 06/11/2015 Goal Status: Active Ulcer/skin breakdown will have a volume reduction of 50% by week 8 Date Initiated: 06/11/2015 Goal Status: Active Ulcer/skin breakdown will have a volume reduction of 80% by week 12 Date Initiated: 06/11/2015 Goal Status: Active Ulcer/skin breakdown will heal within 14 weeks Date Initiated: 06/11/2015 Shuck, Grayling Congress ([id number]) Goal Status: Active Interventions: Assess patient/caregiver ability to perform ulcer/skin care regimen upon admission and as needed Assess ulceration(s) every visit Notes: Electronic Signature(s) Signed: 07/02/2015 9:58:39 AM By: Regan Lemming BSN, RN Entered By: Regan Lemming on 07/02/2015 09:58:38 Cardella, Xavior J. ([id number]) -------------------------------------------------------------------------------- Pain Assessment Details Patient Name: [name], Adriell J. Date of Service: 07/02/2015 9:30 AM Medical Record Number: [id number] Patient Account Number: [id number] Date of Birth/Sex: 25-Dec-1960 (54 y.o. Male) Treating RN: Baruch Gouty, RN, BSN, Velva Harman Primary Care Physician: Frazier Richards Other Clinician: Referring Physician: Frazier Richards  Treating Physician/Extender: Frann Rider in Treatment: 3 Active Problems Location of Pain Severity and Description of Pain Patient Has Paino No Site Locations Pain Management and Medication Current Pain  Management: Electronic Signature(s) Signed: 07/02/2015 9:52:36 AM By: Regan Lemming BSN, RN Entered By: Regan Lemming on 07/02/2015 09:52:35 Driggs, Yorel J. ([id number]) -------------------------------------------------------------------------------- Patient/Caregiver Education Details Patient Name: [name], Menno Lenna Sciara. Date of Service: 07/02/2015 9:30 AM Medical Record Number: [id number] Patient Account Number: [id number] Date of Birth/Gender: 1961/09/11 (54 y.o. Male) Treating RN: Baruch Gouty, RN, BSN, Velva Harman Primary Care Physician: Frazier Richards Other Clinician: Referring Physician: Frazier Richards Treating Physician/Extender: Frann Rider in Treatment: 3 Education Assessment Education Provided To: Patient Education Topics Provided Venous: Handouts: Controlling Swelling with Multilayered Compression Wraps Methods: Explain/Verbal Responses: State content correctly Electronic Signature(s) Signed: 07/02/2015 10:11:29 AM By: Regan Lemming BSN, RN Entered By: Regan Lemming on 07/02/2015 10:11:29 Yapp, Sigurd Lenna Sciara ([id number]) -------------------------------------------------------------------------------- Wound Assessment Details Patient Name: Schofield, Sigismund J. Date of Service: 07/02/2015 9:30 AM Medical Record Number: [id number] Patient Account Number: [id number] Date of Birth/Sex: 23-Apr-1961 (54 y.o. Male) Treating RN: Baruch Gouty, RN, BSN, [place] Primary Care Physician: Frazier Richards Other Clinician: Referring Physician: Frazier Richards Treating Physician/Extender: Frann Rider in Treatment: 3 Wound Status Wound Number: 3 Primary Diabetic Wound/Ulcer of the Lower Etiology: Extremity Wound Location: Left Lower Leg - Medial Secondary Venous Leg Ulcer Wounding Event: Gradually Appeared Etiology: Date Acquired: 04/11/2015 Wound Status: Open Weeks Of Treatment: 3 Comorbid Hypertension, Type II Diabetes, Clustered Wound: No History: Osteoarthritis, Neuropathy Wound  Measurements Length: (cm) 3.4 Width: (cm) 6 Depth: (cm) 0.1 Area: (cm) 16.022 Volume: (cm) 1.602 % Reduction in Area: -12.1% % Reduction in Volume: 62.6% Epithelialization: Medium (34-66%) Tunneling: No Undermining: No Wound Description Classification: Grade 2 Foul Odor Aft Wound Margin: Flat and Intact Exudate Amount: Medium Exudate Type: Serous Exudate Color: amber er Cleansing: No Wound Bed Granulation Amount: Medium (34-66%) Exposed Structure Granulation Quality: Pink, Pale Fascia Exposed: No Necrotic Amount: Medium (34-66%) Fat Layer Exposed: No Necrotic Quality: Adherent Slough Tendon Exposed: No Muscle Exposed: No Joint Exposed: No Bone Exposed: No Limited to Skin Breakdown Periwound Skin Texture Texture Color No Abnormalities Noted: No No Abnormalities Noted: No Callus: No Atrophie Blanche: No Crepitus: No Cyanosis: No Excoriation: No Ecchymosis: No Disch, Zakary J. ([id number]) Fluctuance: No Erythema: No Friable: No Hemosiderin Staining: No Induration: No Mottled: No Localized Edema: No Pallor: No Rash: No Rubor: No Scarring: No Temperature / Pain Moisture Temperature: No Abnormality No Abnormalities Noted: No Tenderness on Palpation: Yes Dry / Scaly: No Maceration: No Moist: Yes Wound Preparation Ulcer Cleansing: Other: soap and water, Topical Anesthetic Applied: Other: lidocaine 4%, Treatment Notes Wound #3 (Left, Medial Lower Leg) 1. Cleansed with: Clean wound with Normal Saline 3. Peri-wound Care: Barrier cream 4. Dressing Applied: Hydrafera Blue 5. Secondary Dressing Applied ABD Pad Dry Gauze 7. Secured with Rolena Infante to Left Lower Extremity Electronic Signature(s) Signed: 07/02/2015 9:51:20 AM By: Regan Lemming BSN, RN Entered By: Regan Lemming on 07/02/2015 09:51:19 Lizaola, Breck J. ([id number]) -------------------------------------------------------------------------------- Wound Assessment Details Patient Name:  Huettner, Arles J. Date of Service: 07/02/2015 9:30 AM Medical Record Number: [id number] Patient Account Number: [id number] Date of Birth/Sex: 1961-11-07 (54 y.o. Male) Treating RN: Baruch Gouty, RN, BSN, Velva Harman Primary Care Physician: Frazier Richards Other Clinician: Referring Physician: Frazier Richards Treating Physician/Extender: Frann Rider in Treatment: 3 Wound Status Wound Number: 4 Primary Diabetic Wound/Ulcer of the Lower Etiology: Extremity Wound Location: Left Lower Leg - Lateral Secondary Venous Leg Ulcer Wounding Event:  Gradually Appeared Etiology: Date Acquired: 04/11/2015 Wound Status: Open Weeks Of Treatment: 3 Comorbid Hypertension, Type II Diabetes, Clustered Wound: No History: Osteoarthritis, Neuropathy Wound Measurements Length: (cm) 0.5 Width: (cm) 0.5 Depth: (cm) 0.1 Area: (cm) 0.196 Volume: (cm) 0.02 % Reduction in Area: -24.8% % Reduction in Volume: -25% Epithelialization: Medium (34-66%) Tunneling: No Undermining: No Wound Description Classification: Unable to visualize wound bed Foul Odor Af Wound Margin: Indistinct, nonvisible Exudate Amount: Small Exudate Type: Serosanguineous Exudate Color: red, brown ter Cleansing: No Wound Bed Granulation Amount: None Present (0%) Exposed Structure Necrotic Amount: Large (67-100%) Fascia Exposed: No Necrotic Quality: Adherent Slough Fat Layer Exposed: No Tendon Exposed: No Muscle Exposed: No Joint Exposed: No Bone Exposed: No Limited to Skin Breakdown Periwound Skin Texture Texture Color No Abnormalities Noted: No No Abnormalities Noted: No Callus: No Atrophie Blanche: No Crepitus: No Cyanosis: No Excoriation: No Ecchymosis: No Klippel, Andrae J. ([id number]) Fluctuance: No Erythema: No Friable: No Hemosiderin Staining: No Induration: No Mottled: No Localized Edema: No Pallor: No Rash: No Rubor: No Scarring: No Temperature / Pain Moisture Temperature: No Abnormality No  Abnormalities Noted: No Tenderness on Palpation: Yes Dry / Scaly: No Maceration: No Moist: Yes Wound Preparation Ulcer Cleansing: Other: soap and water, Topical Anesthetic Applied: Other: lidocaine 4%, Treatment Notes Wound #4 (Left, Lateral Lower Leg) 1. Cleansed with: Clean wound with Normal Saline 3. Peri-wound Care: Barrier cream 4. Dressing Applied: Hydrafera Blue 5. Secondary Dressing Applied ABD Pad Dry Gauze 7. Secured with Rolena Infante to Left Lower Extremity Electronic Signature(s) Signed: 07/02/2015 9:51:42 AM By: Regan Lemming BSN, RN Entered By: Regan Lemming on 07/02/2015 09:51:42 Nylander, Kenshin J. ([id number]) -------------------------------------------------------------------------------- Vitals Details Patient Name: [name], Christine J. Date of Service: 07/02/2015 9:30 AM Medical Record Number: [id number] Patient Account Number: [id number] Date of Birth/Sex: 04-01-1961 (54 y.o. Male) Treating RN: Baruch Gouty, RN, BSN, [place] Primary Care Physician: Frazier Richards Other Clinician: Referring Physician: Frazier Richards Treating Physician/Extender: Frann Rider in Treatment: 3 Vital Signs Time Taken: 09:51 Temperature (F): 98.2 Height (in): 74 Pulse (bpm): 86 Weight (lbs): 260 Respiratory Rate (breaths/min): 16 Body Mass Index (BMI): 33.4 Blood Pressure (mmHg): 188/89 Reference Range: 80 - 120 mg / dl Electronic Signature(s) Signed: 07/02/2015 9:52:08 AM By: Regan Lemming BSN, RN Entered By: Regan Lemming on 07/02/2015 09:52:08
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
## 2015-07-09 ENCOUNTER — Encounter (HOSPITAL_BASED_OUTPATIENT_CLINIC_OR_DEPARTMENT_OTHER): Payer: BLUE CROSS/BLUE SHIELD | Admitting: General Surgery

## 2015-07-09 DIAGNOSIS — L97222 Non-pressure chronic ulcer of left calf with fat layer exposed: Secondary | ICD-10-CM | POA: Diagnosis not present

## 2015-07-09 DIAGNOSIS — I83022 Varicose veins of left lower extremity with ulcer of calf: Secondary | ICD-10-CM

## 2015-07-09 DIAGNOSIS — L97229 Non-pressure chronic ulcer of left calf with unspecified severity: Principal | ICD-10-CM
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
## 2015-07-09 NOTE — Progress Notes (Signed)
See i heal 

## 2015-07-09 NOTE — Progress Notes (Addendum)
[name] ([id number]) Visit Report for 07/09/2015 Arrival Information Details Patient Name: [name], [name]. Date of Service: 07/09/2015 8:00 AM Medical Record Number: [id number] Patient Account Number: [id number] Date of Birth/Sex: Oct 04, 1961 (54 y.o. Male) Treating RN: Baruch Gouty, RN, BSN, Velva Harman Primary Care Physician: Frazier Richards Other Clinician: Referring Physician: Frazier Richards Treating Physician/Extender: Benjaman Pott in Treatment: 4 Visit Information History Since Last Visit Any new allergies or adverse reactions: No Patient Arrived: Ambulatory Had a fall or experienced change in No Arrival Time: 08:15 activities of daily living that may affect Accompanied By: self risk of falls: Transfer Assistance: None Signs or symptoms of abuse/neglect since last No Patient Identification Verified: Yes visito Secondary Verification Process Yes Hospitalized since last visit: No Completed: Has Compression in Place as Prescribed: Yes Patient Has Alerts: Yes Pain Present Now: No Electronic Signature(s) Signed: 07/09/2015 8:19:23 AM By: Regan Lemming BSN, RN Entered By: Regan Lemming on 07/09/2015 08:19:23 Ambs, Alexius J. ([id number]) -------------------------------------------------------------------------------- Encounter Discharge Information Details Patient Name: [name], [name] Date of Service: 07/09/2015 8:00 AM Medical Record Number: [id number] Patient Account Number: [id number] Date of Birth/Sex: Apr 26, 1961 (54 y.o. Male) Treating RN: Baruch Gouty, RN, BSN, Velva Harman Primary Care Physician: Frazier Richards Other Clinician: Referring Physician: Frazier Richards Treating Physician/Extender: Benjaman Pott in Treatment: 4 Encounter Discharge Information Items Discharge Pain Level: 0 Discharge Condition: Stable Ambulatory Status: Ambulatory Discharge Destination: Home Transportation: Private Auto Accompanied By: self Schedule Follow-up Appointment:  No Medication Reconciliation completed No and provided to Patient/Care Leonarda Leis: Patient Clinical Summary of Care: Declined Electronic Signature(s) Signed: 07/09/2015 9:15:11 AM By: Ruthine Dose Previous Signature: 07/09/2015 9:01:07 AM Version By: Judene Companion MD Previous Signature: 07/09/2015 8:38:18 AM Version By: Regan Lemming BSN, RN Entered By: Ruthine Dose on 07/09/2015 09:15:11 Warr, Keyonte J. ([id number]) -------------------------------------------------------------------------------- Lower Extremity Assessment Details Patient Name: [name], [name] Date of Service: 07/09/2015 8:00 AM Medical Record Number: [id number] Patient Account Number: [id number] Date of Birth/Sex: 08/05/1961 (54 y.o. Male) Treating RN: Baruch Gouty, RN, BSN, Velva Harman Primary Care Physician: Frazier Richards Other Clinician: Referring Physician: Frazier Richards Treating Physician/Extender: Benjaman Pott in Treatment: 4 Edema Assessment Assessed: Shirlyn Goltz: No] [Right: No] E[Left: dema] [Right: :] Calf Left: Right: Point of Measurement: 34 cm From Medial Instep 36 cm cm Ankle Left: Right: Point of Measurement: 10 cm From Medial Instep 26.5 cm cm Vascular Assessment Pulses: Posterior Tibial Dorsalis Pedis Palpable: [Left:Yes] Extremity colors, hair growth, and conditions: Extremity Color: [Left:Normal] Hair Growth on Extremity: [Left:Yes] Temperature of Extremity: [Left:Warm] Capillary Refill: [Left:< 3 seconds] Dependent Rubor: [Left:No] Blanched when Elevated: [Left:No] Lipodermatosclerosis: [Left:No] Toe Nail Assessment Left: Right: Thick: No Discolored: No Deformed: No Improper Length and Hygiene: No Electronic Signature(s) Signed: 07/09/2015 8:23:55 AM By: Regan Lemming BSN, RN Stipes, Pardeep J. ([id number]) Entered By: Regan Lemming on 07/09/2015 08:23:55 Kines, Heman J. ([id number]) -------------------------------------------------------------------------------- Multi Wound Chart  Details Patient Name: [name], [name] Date of Service: 07/09/2015 8:00 AM Medical Record Number: [id number] Patient Account Number: [id number] Date of Birth/Sex: 1960/12/10 (54 y.o. Male) Treating RN: Cornell Barman Primary Care Physician: Frazier Richards Other Clinician: Referring Physician: Frazier Richards Treating Physician/Extender: Benjaman Pott in Treatment: 4 Vital Signs Height(in): 74 Pulse(bpm): 74 Weight(lbs): 260 Blood Pressure 162/84 (mmHg): Body Mass Index(BMI): 33 Temperature(F): 98 Respiratory Rate 16 (breaths/min): Photos: [3:No Photos] [4:No Photos] [N/A:N/A] Wound Location: [3:Left Lower Leg - Medial] [4:Left Lower Leg - Lateral] [N/A:N/A] Wounding Event: [3:Gradually Appeared] [4:Gradually Appeared] [N/A:N/A] Primary Etiology: [3:Diabetic Wound/Ulcer of the Lower Extremity] [4:Diabetic Wound/Ulcer of the Lower Extremity] [  N/A:N/A] 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] [4:Venous Leg Ulcer] [N/A:N/A] Comorbid History: [3:Hypertension, Type II Diabetes, Osteoarthritis, Neuropathy] [4:Hypertension, Type II Diabetes, Osteoarthritis, Neuropathy] [N/A:N/A] Date Acquired: [3:04/11/2015] [4:04/11/2015] [N/A:N/A] Weeks of Treatment: [3:4] [4:4] [N/A:N/A] Wound Status: [3:Open] [4:Open] [N/A:N/A] Measurements L x W x D 3.4x6x0.1 [4:0.5x0.5x0.1] [N/A:N/A] (cm) Area (cm) : [3:16.022] [4:0.196] [N/A:N/A] Volume (cm) : [3:1.602] [4:0.02] [N/A:N/A] % Reduction in Area: [3:-12.10%] [4:-24.80%] [N/A:N/A] % Reduction in Volume: 62.60% [4:-25.00%] [N/A:N/A] Classification: [3:Grade 2] [4:Unable to visualize wound N/A bed] Exudate Amount: [3:Medium] [4:Small] [N/A:N/A] Exudate Type: [3:Serous] [4:Serosanguineous] [N/A:N/A] Exudate Color: [3:amber] [4:red, brown] [N/A:N/A] Wound Margin: [3:Flat and Intact] [4:Distinct, outline attached N/A] Granulation Amount: [3:Medium (34-66%)] [4:None Present (0%)] [N/A:N/A] Granulation Quality: [3:Pink, Pale] [4:N/A]  [N/A:N/A] Necrotic Amount: [3:Medium (34-66%)] [4:Large (67-100%)] [N/A:N/A] Exposed Structures: [3:Fascia: No Fat: No Tendon: No] [4:Fascia: No Fat: No Tendon: No] [N/A:N/A] Muscle: No Muscle: No Joint: No Joint: No Bone: No Bone: No Limited to Skin Limited to Skin Breakdown Breakdown Epithelialization: Medium (34-66%) Medium (34-66%) N/A Periwound Skin Texture: Edema: No Edema: Yes N/A Excoriation: No Excoriation: No Induration: No Induration: No Callus: No Callus: No Crepitus: No Crepitus: No Fluctuance: No Fluctuance: No Friable: No Friable: No Rash: No Rash: No Scarring: No Scarring: No Periwound Skin Moist: Yes Moist: Yes N/A Moisture: Maceration: No Maceration: No Dry/Scaly: No Dry/Scaly: No Periwound Skin Color: Atrophie Blanche: No Atrophie Blanche: No N/A Cyanosis: No Cyanosis: No Ecchymosis: No Ecchymosis: No Erythema: No Erythema: No Hemosiderin Staining: No Hemosiderin Staining: No Mottled: No Mottled: No Pallor: No Pallor: No Rubor: No Rubor: No Temperature: No Abnormality No Abnormality N/A Tenderness on Yes Yes N/A Palpation: Wound Preparation: Ulcer Cleansing: Other: Ulcer Cleansing: Other: N/A soap and water soap and water Topical Anesthetic Topical Anesthetic Applied: Other: lidocaine Applied: Other: lidocaine 4% 4% Treatment Notes Electronic Signature(s) Signed: 07/09/2015 12:42:01 PM By: Gretta Cool, RN, BSN, Kim RN, BSN Entered By: Gretta Cool, RN, BSN, Kim on 07/09/2015 08:30:40 Bey, Correll Lenna [name] ([id number]) -------------------------------------------------------------------------------- Multi-Disciplinary Care Plan Details Patient Name: MASIAH, WOODY. Date of Service: 07/09/2015 8:00 AM Medical Record Number: [id number] Patient Account Number: [id number] Date of Birth/Sex: May 16, 1961 (54 y.o. Male) Treating RN: Cornell Barman Primary Care Physician: Frazier Richards Other Clinician: Referring Physician: Frazier Richards Treating  Physician/Extender: Benjaman Pott in Treatment: 4 Active Inactive Venous Leg Ulcer Nursing Diagnoses: Actual venous Insuffiency (use after diagnosis is confirmed) Goals: Patient will maintain optimal edema control Date Initiated: 06/11/2015 Goal Status: Active Patient/caregiver will verbalize understanding of disease process and disease management Date Initiated: 06/11/2015 Goal Status: Active Interventions: Assess peripheral edema status every visit. Compression as ordered Notes: Wound/Skin Impairment Nursing Diagnoses: Impaired tissue integrity Goals: Ulcer/skin breakdown will have a volume reduction of 30% by week 4 Date Initiated: 06/11/2015 Goal Status: Active Ulcer/skin breakdown will have a volume reduction of 50% by week 8 Date Initiated: 06/11/2015 Goal Status: Active Ulcer/skin breakdown will have a volume reduction of 80% by week 12 Date Initiated: 06/11/2015 Goal Status: Active Ulcer/skin breakdown will heal within 14 weeks Date Initiated: 06/11/2015 Kintz, Grayling Congress ([id number]) Goal Status: Active Interventions: Assess patient/caregiver ability to perform ulcer/skin care regimen upon admission and as needed Assess ulceration(s) every visit Notes: Electronic Signature(s) Signed: 07/09/2015 12:42:01 PM By: Gretta Cool, RN, BSN, Kim RN, BSN Entered By: Gretta Cool, RN, BSN, Kim on 07/09/2015 08:30:33 Leisure, [name] ([id number]) -------------------------------------------------------------------------------- Pain Assessment Details Patient Name: [name], [name] Date of Service: 07/09/2015 8:00 AM Medical Record Number: [id number] Patient Account Number: [id number] Date of Birth/Sex: 27-Dec-1960 (53 y.o.  Male) Treating RN: Baruch Gouty, RN, BSN, Velva Harman Primary Care Physician: Frazier Richards Other Clinician: Referring Physician: Frazier Richards Treating Physician/Extender: Benjaman Pott in Treatment: 4 Active Problems Location of Pain Severity and Description of  Pain Patient Has Paino No Site Locations Pain Management and Medication Current Pain Management: Electronic Signature(s) Signed: 07/09/2015 8:19:28 AM By: Regan Lemming BSN, RN Entered By: Regan Lemming on 07/09/2015 08:19:28 Jech, Tab Lenna [name] ([id number]) -------------------------------------------------------------------------------- Patient/Caregiver Education Details Patient Name: Hulda Marin. Date of Service: 07/09/2015 8:00 AM Medical Record Number: [id number] Patient Account Number: [id number] Date of Birth/Gender: 12-19-1960 (54 y.o. Male) Treating RN: Baruch Gouty, RN, BSN, Velva Harman Primary Care Physician: Frazier Richards Other Clinician: Referring Physician: Frazier Richards Treating Physician/Extender: Benjaman Pott in Treatment: 4 Education Assessment Education Provided To: Patient Education Topics Provided Basic Hygiene: Methods: Explain/Verbal Responses: State content correctly Wound/Skin Impairment: Methods: Explain/Verbal Responses: State content correctly Electronic Signature(s) Signed: 07/09/2015 9:01:19 AM By: Judene Companion MD Previous Signature: 07/09/2015 8:38:32 AM Version By: Regan Lemming BSN, RN Entered By: Judene Companion on 07/09/2015 09:01:18 Krammes, Ryzen J. ([id number]) -------------------------------------------------------------------------------- Wound Assessment Details Patient Name: Joaquin, [name] Date of Service: 07/09/2015 8:00 AM Medical Record Number: [id number] Patient Account Number: [id number] Date of Birth/Sex: 11/16/1960 (54 y.o. Male) Treating RN: Baruch Gouty, RN, BSN, [place] Primary Care Physician: Frazier Richards Other Clinician: Referring Physician: Frazier Richards Treating Physician/Extender: Benjaman Pott in Treatment: 4 Wound Status Wound Number: 3 Primary Diabetic Wound/Ulcer of the Lower Etiology: Extremity Wound Location: Left Lower Leg - Medial Secondary Venous Leg Ulcer Wounding Event: Gradually  Appeared Etiology: Date Acquired: 04/11/2015 Wound Status: Open Weeks Of Treatment: 4 Comorbid Hypertension, Type II Diabetes, Clustered Wound: No History: Osteoarthritis, Neuropathy Photos Wound Measurements Length: (cm) 3.4 Width: (cm) 6 Depth: (cm) 0.1 Area: (cm) 16.022 Volume: (cm) 1.602 % Reduction in Area: -12.1% % Reduction in Volume: 62.6% Epithelialization: Medium (34-66%) Tunneling: No Undermining: No Wound Description Classification: Grade 2 Foul Odor Afte Wound Margin: Flat and Intact Exudate Amount: Medium Exudate Type: Serous Exudate Color: amber r Cleansing: No Wound Bed Granulation Amount: Medium (34-66%) Exposed Structure Granulation Quality: Pink, Pale Fascia Exposed: No Necrotic Amount: Medium (34-66%) Fat Layer Exposed: No Necrotic Quality: Adherent Slough Tendon Exposed: No Muscle Exposed: No Aliano, Ramar J. ([id number]) Joint Exposed: No Bone Exposed: No Limited to Skin Breakdown Periwound Skin Texture Texture Color No Abnormalities Noted: No No Abnormalities Noted: No Callus: No Atrophie Blanche: No Crepitus: No Cyanosis: No Excoriation: No Ecchymosis: No Fluctuance: No Erythema: No Friable: No Hemosiderin Staining: No Induration: No Mottled: No Localized Edema: No Pallor: No Rash: No Rubor: No Scarring: No Temperature / Pain Moisture Temperature: No Abnormality No Abnormalities Noted: No Tenderness on Palpation: Yes Dry / Scaly: No Maceration: No Moist: Yes Wound Preparation Ulcer Cleansing: Other: soap and water, Topical Anesthetic Applied: Other: lidocaine 4%, Treatment Notes Wound #3 (Left, Medial Lower Leg) 1. Cleansed with: Cleanse wound with antibacterial soap and water 3. Peri-wound Care: Barrier cream 4. Dressing Applied: Hydrafera Blue 5. Secondary Dressing Applied ABD Pad Dry Gauze 7. Secured with Rolena Infante to Left Lower Extremity Electronic Signature(s) Signed: 07/10/2015 3:53:23 PM By: Regan Lemming BSN, RN Previous Signature: 07/09/2015 8:25:16 AM Version By: Regan Lemming BSN, RN Entered By: Regan Lemming on 07/10/2015 15:53:23 Brodowski, Kionte J. ([id number]) -------------------------------------------------------------------------------- Wound Assessment Details Patient Name: Curro, [name] Date of Service: 07/09/2015 8:00 AM Medical Record Number: [id number] Patient Account Number: [id number] Date of Birth/Sex: [birth year] (54 y.o. Male) Treating RN: Baruch Gouty, RN, BSN, [employer]  Care Physician: Frazier Richards Other Clinician: Referring Physician: Frazier Richards Treating Physician/Extender: Benjaman Pott in Treatment: 4 Wound Status Wound Number: 4 Primary Diabetic Wound/Ulcer of the Lower Etiology: Extremity Wound Location: Left Lower Leg - Lateral Secondary Venous Leg Ulcer Wounding Event: Gradually Appeared Etiology: Date Acquired: 04/11/2015 Wound Status: Open Weeks Of Treatment: 4 Comorbid Hypertension, Type II Diabetes, Clustered Wound: No History: Osteoarthritis, Neuropathy Photos Wound Measurements Length: (cm) 0.5 Width: (cm) 0.5 Depth: (cm) 0.1 Area: (cm) 0.196 Volume: (cm) 0.02 % Reduction in Area: -24.8% % Reduction in Volume: -25% Epithelialization: Medium (34-66%) Tunneling: No Undermining: No Wound Description Classification: Unable to visualize wound bed Foul Odor Afte Wound Margin: Distinct, outline attached Exudate Amount: Small Exudate Type: Serosanguineous Exudate Color: red, brown r Cleansing: No Wound Bed Granulation Amount: None Present (0%) Exposed Structure Necrotic Amount: Large (67-100%) Fascia Exposed: No Necrotic Quality: Adherent Slough Fat Layer Exposed: No Tendon Exposed: No Muscle Exposed: No Galindez, Aslan J. ([id number]) Joint Exposed: No Bone Exposed: No Limited to Skin Breakdown Periwound Skin Texture Texture Color No Abnormalities Noted: No No Abnormalities Noted: No Callus: No Atrophie Blanche:  No Crepitus: No Cyanosis: No Excoriation: No Ecchymosis: No Fluctuance: No Erythema: No Friable: No Hemosiderin Staining: No Induration: No Mottled: No Localized Edema: Yes Pallor: No Rash: No Rubor: No Scarring: No Temperature / Pain Moisture Temperature: No Abnormality No Abnormalities Noted: No Tenderness on Palpation: Yes Dry / Scaly: No Maceration: No Moist: Yes Wound Preparation Ulcer Cleansing: Other: soap and water, Topical Anesthetic Applied: Other: lidocaine 4%, Treatment Notes Wound #4 (Left, Lateral Lower Leg) 1. Cleansed with: Cleanse wound with antibacterial soap and water 3. Peri-wound Care: Barrier cream 4. Dressing Applied: Hydrafera Blue 5. Secondary Dressing Applied ABD Pad Dry Gauze 7. Secured with Rolena Infante to Left Lower Extremity Electronic Signature(s) Signed: 07/10/2015 3:53:51 PM By: Regan Lemming BSN, RN Previous Signature: 07/09/2015 8:27:00 AM Version By: Regan Lemming BSN, RN Entered By: Regan Lemming on 07/10/2015 15:53:51 Lina, Demetries JMarland Kitchen ([id number]) -------------------------------------------------------------------------------- Vitals Details Patient Name: [name], Hervey J. Date of Service: 07/09/2015 8:00 AM Medical Record Number: [id number] Patient Account Number: [id number] Date of Birth/Sex: Sep 21, 1961 (54 y.o. Male) Treating RN: Baruch Gouty, RN, BSN, [place] Primary Care Physician: Frazier Richards Other Clinician: Referring Physician: Frazier Richards Treating Physician/Extender: Benjaman Pott in Treatment: 4 Vital Signs Time Taken: 08:19 Temperature (F): 98 Height (in): 74 Pulse (bpm): 74 Weight (lbs): 260 Respiratory Rate (breaths/min): 16 Body Mass Index (BMI): 33.4 Blood Pressure (mmHg): 162/84 Reference Range: 80 - 120 mg / dl Electronic Signature(s) Signed: 07/09/2015 8:19:55 AM By: Regan Lemming BSN, RN Entered By: Regan Lemming on 07/09/2015 08:19:55

## 2015-07-16 ENCOUNTER — Encounter: Payer: BLUE CROSS/BLUE SHIELD | Attending: Surgery | Admitting: Surgery

## 2015-07-16 DIAGNOSIS — I83022 Varicose veins of left lower extremity with ulcer of calf: Secondary | ICD-10-CM | POA: Diagnosis not present

## 2015-07-16 DIAGNOSIS — I129 Hypertensive chronic kidney disease with stage 1 through stage 4 chronic kidney disease, or unspecified chronic kidney disease: Secondary | ICD-10-CM | POA: Insufficient documentation

## 2015-07-16 DIAGNOSIS — I872 Venous insufficiency (chronic) (peripheral): Secondary | ICD-10-CM | POA: Insufficient documentation

## 2015-07-16 DIAGNOSIS — E1140 Type 2 diabetes mellitus with diabetic neuropathy, unspecified: Secondary | ICD-10-CM | POA: Insufficient documentation

## 2015-07-16 DIAGNOSIS — N184 Chronic kidney disease, stage 4 (severe): Secondary | ICD-10-CM | POA: Insufficient documentation

## 2015-07-16 DIAGNOSIS — E11622 Type 2 diabetes mellitus with other skin ulcer: Secondary | ICD-10-CM | POA: Diagnosis not present

## 2015-07-16 DIAGNOSIS — L97222 Non-pressure chronic ulcer of left calf with fat layer exposed: Secondary | ICD-10-CM | POA: Diagnosis not present

## 2015-07-16 DIAGNOSIS — R600 Localized edema: Secondary | ICD-10-CM | POA: Insufficient documentation

## 2015-07-16 NOTE — Progress Notes (Addendum)
[name] ([id number]) Visit Report for 07/16/2015 Arrival Information Details Patient Name: [name], [name]. Date of Service: 07/16/2015 8:45 AM Medical Record Number: [id number] Patient Account Number: [id number] Date of Birth/Sex: 09-May-1961 (54 y.o. Male) Treating RN: Baruch Gouty, RN, BSN, Velva Harman Primary Care Physician: Frazier Richards Other Clinician: Referring Physician: Frazier Richards Treating Physician/Extender: Frann Rider in Treatment: 5 Visit Information History Since Last Visit Added or deleted any medications: No Patient Arrived: Ambulatory Any new allergies or adverse reactions: No Arrival Time: 08:34 Had a fall or experienced change in No Accompanied By: self activities of daily living that may affect Transfer Assistance: None risk of falls: Patient Identification Verified: Yes Signs or symptoms of abuse/neglect since last No Secondary Verification Process Yes visito Completed: Hospitalized since last visit: No Patient Has Alerts: Yes Has Dressing in Place as Prescribed: Yes Has Compression in Place as Prescribed: Yes Pain Present Now: No Electronic Signature(s) Signed: 07/16/2015 8:35:31 AM By: Regan Lemming BSN, RN Entered By: Regan Lemming on 07/16/2015 08:35:31 Falkenstein, Klever J. ([id number]) -------------------------------------------------------------------------------- Encounter Discharge Information Details Patient Name: [name], [name] Date of Service: 07/16/2015 8:45 AM Medical Record Number: [id number] Patient Account Number: [id number] Date of Birth/Sex: Oct 02, 1961 (54 y.o. Male) Treating RN: Baruch Gouty, RN, BSN, Velva Harman Primary Care Physician: Frazier Richards Other Clinician: Referring Physician: Frazier Richards Treating Physician/Extender: Frann Rider in Treatment: 5 Encounter Discharge Information Items Discharge Pain Level: 0 Discharge Condition: Stable Ambulatory Status: Ambulatory Discharge Destination: Home Transportation:  Private Auto Accompanied By: self Schedule Follow-up Appointment: No Medication Reconciliation completed No and provided to Patient/Care Xela Oregel: Patient Clinical Summary of Care: Declined Electronic Signature(s) Signed: 07/16/2015 9:09:03 AM By: Ruthine Dose Previous Signature: 07/16/2015 9:01:02 AM Version By: Regan Lemming BSN, RN Entered By: Ruthine Dose on 07/16/2015 09:09:02 Hedglin, Andy J. ([id number]) -------------------------------------------------------------------------------- Lower Extremity Assessment Details Patient Name: Sheley, [name] Date of Service: 07/16/2015 8:45 AM Medical Record Number: [id number] Patient Account Number: [id number] Date of Birth/Sex: 12-21-1960 (54 y.o. Male) Treating RN: Baruch Gouty, RN, BSN, Velva Harman Primary Care Physician: Frazier Richards Other Clinician: Referring Physician: Frazier Richards Treating Physician/Extender: Frann Rider in Treatment: 5 Edema Assessment Assessed: Shirlyn Goltz: No] [Right: No] E[Left: dema] [Right: :] Calf Left: Right: Point of Measurement: 34 cm From Medial Instep 36.2 cm cm Ankle Left: Right: Point of Measurement: 10 cm From Medial Instep 26.2 cm cm Vascular Assessment Claudication: Claudication Assessment [Left:None] Pulses: Posterior Tibial Dorsalis Pedis Palpable: [Left:Yes] Extremity colors, hair growth, and conditions: Extremity Color: [Left:Normal] Hair Growth on Extremity: [Left:Yes] Temperature of Extremity: [Left:Warm] Capillary Refill: [Left:< 3 seconds] Toe Nail Assessment Left: Right: Thick: No Discolored: No Deformed: No Improper Length and Hygiene: No Electronic Signature(s) Signed: 07/16/2015 8:40:13 AM By: Regan Lemming BSN, RN Entered By: Regan Lemming on 07/16/2015 08:40:13 England, Doug J. ([id number]) Amble, Andree J. ([id number]) -------------------------------------------------------------------------------- Multi Wound Chart Details Patient Name: Hine, [name] Date of  Service: 07/16/2015 8:45 AM Medical Record Number: [id number] Patient Account Number: [id number] Date of Birth/Sex: 12/26/1960 (54 y.o. Male) Treating RN: Baruch Gouty, RN, BSN, Velva Harman Primary Care Physician: Frazier Richards Other Clinician: Referring Physician: Frazier Richards Treating Physician/Extender: Frann Rider in Treatment: 5 Vital Signs Height(in): 74 Pulse(bpm): 83 Weight(lbs): 260 Blood Pressure 200/77 (mmHg): Body Mass Index(BMI): 33 Temperature(F): 98.4 Respiratory Rate 17 (breaths/min): Photos: [3:No Photos] [4:No Photos] [N/A:N/A] Wound Location: [3:Left Lower Leg - Medial] [4:Left Lower Leg - Lateral] [N/A:N/A] Wounding Event: [3:Gradually Appeared] [4:Gradually Appeared] [N/A:N/A] Primary Etiology: [3:Diabetic Wound/Ulcer of the Lower Extremity] [4:Diabetic Wound/Ulcer of the Lower Extremity] [  N/A:N/A] 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] [4:Venous Leg Ulcer] [N/A:N/A] Comorbid History: [3:Hypertension, Type II Diabetes, Osteoarthritis, Neuropathy] [4:Hypertension, Type II Diabetes, Osteoarthritis, Neuropathy] [N/A:N/A] Date Acquired: [3:04/11/2015] [4:04/11/2015] [N/A:N/A] Weeks of Treatment: [3:5] [4:5] [N/A:N/A] Wound Status: [3:Open] [4:Open] [N/A:N/A] Measurements L x W x D 3.5x5.1x0.1 [4:0.4x0.4x0.1] [N/A:N/A] (cm) Area (cm) : [3:14.019] [4:0.126] [N/A:N/A] Volume (cm) : [3:1.402] [4:0.013] [N/A:N/A] % Reduction in Area: [3:1.90%] [4:19.70%] [N/A:N/A] % Reduction in Volume: 67.30% [4:18.80%] [N/A:N/A] Classification: [3:Grade 2] [4:Grade 2] [N/A:N/A] Exudate Amount: [3:Medium] [4:Small] [N/A:N/A] Exudate Type: [3:Serous] [4:Serosanguineous] [N/A:N/A] Exudate Color: [3:amber] [4:red, brown] [N/A:N/A] Wound Margin: [3:Flat and Intact] [4:Distinct, outline attached] [N/A:N/A] Granulation Amount: [3:Large (67-100%)] [4:None Present (0%)] [N/A:N/A] Granulation Quality: [3:Pink, Pale] [4:N/A] [N/A:N/A] Necrotic Amount: [3:Small (1-33%)] [4:Large  (67-100%)] [N/A:N/A] Exposed Structures: [3:Fascia: No Fat: No Tendon: No Muscle: No] [4:Fascia: No Fat: No Tendon: No Muscle: No] [N/A:N/A] Joint: No Joint: No Bone: No Bone: No Limited to Skin Limited to Skin Breakdown Breakdown Epithelialization: Medium (34-66%) Medium (34-66%) N/A Periwound Skin Texture: Edema: No Edema: Yes N/A Excoriation: No Excoriation: No Induration: No Induration: No Callus: No Callus: No Crepitus: No Crepitus: No Fluctuance: No Fluctuance: No Friable: No Friable: No Rash: No Rash: No Scarring: No Scarring: No Periwound Skin Moist: Yes Moist: Yes N/A Moisture: Maceration: No Maceration: No Dry/Scaly: No Dry/Scaly: No Periwound Skin Color: Atrophie Blanche: No Atrophie Blanche: No N/A Cyanosis: No Cyanosis: No Ecchymosis: No Ecchymosis: No Erythema: No Erythema: No Hemosiderin Staining: No Hemosiderin Staining: No Mottled: No Mottled: No Pallor: No Pallor: No Rubor: No Rubor: No Temperature: No Abnormality No Abnormality N/A Tenderness on Yes Yes N/A Palpation: Wound Preparation: Ulcer Cleansing: Other: Ulcer Cleansing: Other: N/A soap and water soap and water Topical Anesthetic Topical Anesthetic Applied: Other: lidocaine Applied: Other: lidocaine 4% 4% Treatment Notes Electronic Signature(s) Signed: 07/16/2015 8:50:15 AM By: Regan Lemming BSN, RN Entered By: Regan Lemming on 07/16/2015 08:50:15 Sherburn, Tyshawn Lenna Sciara ([id number]) -------------------------------------------------------------------------------- Multi-Disciplinary Care Plan Details Patient Name: [name], Ancel J. Date of Service: 07/16/2015 8:45 AM Medical Record Number: [id number] Patient Account Number: [id number] Date of Birth/Sex: 1961/05/11 (54 y.o. Male) Treating RN: Baruch Gouty, RN, BSN, Velva Harman Primary Care Physician: Frazier Richards Other Clinician: Referring Physician: Frazier Richards Treating Physician/Extender: Frann Rider in Treatment: 5 Active  Inactive Venous Leg Ulcer Nursing Diagnoses: Actual venous Insuffiency (use after diagnosis is confirmed) Goals: Patient will maintain optimal edema control Date Initiated: 06/11/2015 Goal Status: Active Patient/caregiver will verbalize understanding of disease process and disease management Date Initiated: 06/11/2015 Goal Status: Active Interventions: Assess peripheral edema status every visit. Compression as ordered Notes: Wound/Skin Impairment Nursing Diagnoses: Impaired tissue integrity Goals: Ulcer/skin breakdown will have a volume reduction of 30% by week 4 Date Initiated: 06/11/2015 Goal Status: Active Ulcer/skin breakdown will have a volume reduction of 50% by week 8 Date Initiated: 06/11/2015 Goal Status: Active Ulcer/skin breakdown will have a volume reduction of 80% by week 12 Date Initiated: 06/11/2015 Goal Status: Active Ulcer/skin breakdown will heal within 14 weeks Date Initiated: 06/11/2015 Ciszek, Grayling Congress ([id number]) Goal Status: Active Interventions: Assess patient/caregiver ability to perform ulcer/skin care regimen upon admission and as needed Assess ulceration(s) every visit Notes: Electronic Signature(s) Signed: 07/16/2015 8:50:07 AM By: Regan Lemming BSN, RN Entered By: Regan Lemming on 07/16/2015 08:50:07 Ayala, Phinneas J. ([id number]) -------------------------------------------------------------------------------- Pain Assessment Details Patient Name: Palmer, Nakoa J. Date of Service: 07/16/2015 8:45 AM Medical Record Number: [id number] Patient Account Number: [id number] Date of Birth/Sex: Jun 29, 1961 (54 y.o. Male) Treating RN: Baruch Gouty, RN, BSN, [facility]  Physician: Frazier Richards Other Clinician: Referring Physician: Frazier Richards Treating Physician/Extender: Frann Rider in Treatment: 5 Active Problems Location of Pain Severity and Description of Pain Patient Has Paino No Site Locations Pain Management and Medication Current Pain  Management: Electronic Signature(s) Signed: 07/16/2015 8:35:38 AM By: Regan Lemming BSN, RN Entered By: Regan Lemming on 07/16/2015 08:35:38 Littlefield, Carl J. ([id number]) -------------------------------------------------------------------------------- Patient/Caregiver Education Details Patient Name: Hulda Marin. Date of Service: 07/16/2015 8:45 AM Medical Record Number: [id number] Patient Account Number: [id number] Date of Birth/Gender: 04/29/1961 (54 y.o. Male) Treating RN: Baruch Gouty, RN, BSN, Velva Harman Primary Care Physician: Frazier Richards Other Clinician: Referring Physician: Frazier Richards Treating Physician/Extender: Frann Rider in Treatment: 5 Education Assessment Education Provided To: Patient Education Topics Provided Basic Hygiene: Methods: Explain/Verbal Responses: State content correctly Wound/Skin Impairment: Methods: Explain/Verbal Responses: State content correctly Electronic Signature(s) Signed: 07/16/2015 9:01:15 AM By: Regan Lemming BSN, RN Entered By: Regan Lemming on 07/16/2015 09:01:14 Sanzone, Aneesh J. ([id number]) -------------------------------------------------------------------------------- Wound Assessment Details Patient Name: Stallbaumer, Prabhav J. Date of Service: 07/16/2015 8:45 AM Medical Record Number: [id number] Patient Account Number: [id number] Date of Birth/Sex: [birth year] (54 y.o. Male) Treating RN: Baruch Gouty, RN, BSN, [place] Primary Care Physician: Frazier Richards Other Clinician: Referring Physician: Frazier Richards Treating Physician/Extender: Frann Rider in Treatment: 5 Wound Status Wound Number: 3 Primary Diabetic Wound/Ulcer of the Lower Etiology: Extremity Wound Location: Left Lower Leg - Medial Secondary Venous Leg Ulcer Wounding Event: Gradually Appeared Etiology: Date Acquired: 04/11/2015 Wound Status: Open Weeks Of Treatment: 5 Comorbid Hypertension, Type II Diabetes, Clustered Wound: No History: Osteoarthritis,  Neuropathy Wound Measurements Length: (cm) 3.5 Width: (cm) 5.1 Depth: (cm) 0.1 Area: (cm) 14.019 Volume: (cm) 1.402 % Reduction in Area: 1.9% % Reduction in Volume: 67.3% Epithelialization: Medium (34-66%) Tunneling: No Undermining: No Wound Description Classification: Grade 2 Foul Odor Aft Wound Margin: Flat and Intact Exudate Amount: Medium Exudate Type: Serous Exudate Color: amber er Cleansing: No Wound Bed Granulation Amount: Large (67-100%) Exposed Structure Granulation Quality: Pink, Pale Fascia Exposed: No Necrotic Amount: Small (1-33%) Fat Layer Exposed: No Necrotic Quality: Adherent Slough Tendon Exposed: No Muscle Exposed: No Joint Exposed: No Bone Exposed: No Limited to Skin Breakdown Periwound Skin Texture Texture Color No Abnormalities Noted: No No Abnormalities Noted: No Callus: No Atrophie Blanche: No Crepitus: No Cyanosis: No Excoriation: No Ecchymosis: No Corniel, Klein J. ([id number]) Fluctuance: No Erythema: No Friable: No Hemosiderin Staining: No Induration: No Mottled: No Localized Edema: No Pallor: No Rash: No Rubor: No Scarring: No Temperature / Pain Moisture Temperature: No Abnormality No Abnormalities Noted: No Tenderness on Palpation: Yes Dry / Scaly: No Maceration: No Moist: Yes Wound Preparation Ulcer Cleansing: Other: soap and water, Topical Anesthetic Applied: Other: lidocaine 4%, Treatment Notes Wound #3 (Left, Medial Lower Leg) 1. Cleansed with: Cleanse wound with antibacterial soap and water 3. Peri-wound Care: Barrier cream 4. Dressing Applied: Hydrafera Blue 5. Secondary Dressing Applied ABD Pad Dry Gauze 7. Secured with Rolena Infante to Left Lower Extremity Electronic Signature(s) Signed: 07/16/2015 8:46:45 AM By: Regan Lemming BSN, RN Entered By: Regan Lemming on 07/16/2015 08:46:45 Gehlhausen, Isamar J. ([id number]) -------------------------------------------------------------------------------- Wound  Assessment Details Patient Name: Goldbach, Bria J. Date of Service: 07/16/2015 8:45 AM Medical Record Number: [id number] Patient Account Number: [id number] Date of Birth/Sex: 11-05-1961 (54 y.o. Male) Treating RN: Baruch Gouty, RN, BSN, Velva Harman Primary Care Physician: Frazier Richards Other Clinician: Referring Physician: Frazier Richards Treating Physician/Extender: Frann Rider in Treatment: 5 Wound Status Wound Number: 4 Primary Diabetic Wound/Ulcer of the Lower Etiology: Extremity  Wound Location: Left Lower Leg - Lateral Secondary Venous Leg Ulcer Wounding Event: Gradually Appeared Etiology: Date Acquired: 04/11/2015 Wound Status: Open Weeks Of Treatment: 5 Comorbid Hypertension, Type II Diabetes, Clustered Wound: No History: Osteoarthritis, Neuropathy Wound Measurements Length: (cm) 0.4 Width: (cm) 0.4 Depth: (cm) 0.1 Area: (cm) 0.126 Volume: (cm) 0.013 % Reduction in Area: 19.7% % Reduction in Volume: 18.8% Epithelialization: Medium (34-66%) Tunneling: No Undermining: No Wound Description Classification: Grade 2 Foul Odor Aft Wound Margin: Distinct, outline attached Exudate Amount: Small Exudate Type: Serosanguineous Exudate Color: red, brown er Cleansing: No Wound Bed Granulation Amount: None Present (0%) Exposed Structure Necrotic Amount: Large (67-100%) Fascia Exposed: No Necrotic Quality: Adherent Slough Fat Layer Exposed: No Tendon Exposed: No Muscle Exposed: No Joint Exposed: No Bone Exposed: No Limited to Skin Breakdown Periwound Skin Texture Texture Color No Abnormalities Noted: No No Abnormalities Noted: No Callus: No Atrophie Blanche: No Crepitus: No Cyanosis: No Excoriation: No Ecchymosis: No Minks, Zayden J. ([id number]) Fluctuance: No Erythema: No Friable: No Hemosiderin Staining: No Induration: No Mottled: No Localized Edema: Yes Pallor: No Rash: No Rubor: No Scarring: No Temperature / Pain Moisture Temperature: No  Abnormality No Abnormalities Noted: No Tenderness on Palpation: Yes Dry / Scaly: No Maceration: No Moist: Yes Wound Preparation Ulcer Cleansing: Other: soap and water, Topical Anesthetic Applied: Other: lidocaine 4%, Treatment Notes Wound #4 (Left, Lateral Lower Leg) 1. Cleansed with: Cleanse wound with antibacterial soap and water 3. Peri-wound Care: Barrier cream 4. Dressing Applied: Hydrafera Blue 5. Secondary Dressing Applied ABD Pad Dry Gauze 7. Secured with Rolena Infante to Left Lower Extremity Electronic Signature(s) Signed: 07/16/2015 8:47:05 AM By: Regan Lemming BSN, RN Entered By: Regan Lemming on 07/16/2015 08:47:05 Hartstein, Leman J. ([id number]) -------------------------------------------------------------------------------- Vitals Details Patient Name: [name], Johntavious J. Date of Service: 07/16/2015 8:45 AM Medical Record Number: [id number] Patient Account Number: [id number] Date of Birth/Sex: [birth year] (54 y.o. Male) Treating RN: Baruch Gouty, RN, BSN, [place] Primary Care Physician: Frazier Richards Other Clinician: Referring Physician: Frazier Richards Treating Physician/Extender: Frann Rider in Treatment: 5 Vital Signs Time Taken: 08:35 Temperature (F): 98.4 Height (in): 74 Pulse (bpm): 83 Weight (lbs): 260 Respiratory Rate (breaths/min): 17 Body Mass Index (BMI): 33.4 Blood Pressure (mmHg): 200/77 Reference Range: 80 - 120 mg / dl Notes Patient said he already took his medicine this morning. Patient to call MD about BP Electronic Signature(s) Signed: 07/16/2015 8:37:32 AM By: Regan Lemming BSN, RN Entered By: Regan Lemming on 07/16/2015 08:37:32
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
## 2015-07-23 ENCOUNTER — Ambulatory Visit: Payer: BLUE CROSS/BLUE SHIELD | Admitting: Surgery

## 2015-07-30 ENCOUNTER — Encounter: Payer: BLUE CROSS/BLUE SHIELD | Admitting: Surgery

## 2015-07-30 DIAGNOSIS — L97222 Non-pressure chronic ulcer of left calf with fat layer exposed: Secondary | ICD-10-CM | POA: Diagnosis not present

## 2015-07-30 NOTE — Progress Notes (Signed)
SYLVAN, SOOKDEO ([id number]) Visit Report for 07/30/2015 Arrival Information Details Patient Name: [name], [name]. Date of Service: 07/30/2015 8:45 AM Medical Record Number: [id number] Patient Account Number: [id number] Date of Birth/Sex: 1961/05/14 (54 y.o. Male) Treating RN: Cornell Barman Primary Care Physician: Frazier Richards Other Clinician: Referring Physician: Frazier Richards Treating Physician/Extender: Frann Rider in Treatment: 7 Visit Information History Since Last Visit Added or deleted any medications: No Patient Arrived: Ambulatory Any new allergies or adverse reactions: No Arrival Time: 08:45 Had a fall or experienced change in No Accompanied By: self activities of daily living that may affect Transfer Assistance: None risk of falls: Patient Identification Verified: Yes Signs or symptoms of abuse/neglect since last No Secondary Verification Process Yes visito Completed: Has Dressing in Place as Prescribed: Yes Patient Has Alerts: Yes Has Compression in Place as Prescribed: Yes Pain Present Now: No Electronic Signature(s) Signed: 07/30/2015 2:12:38 PM By: Gretta Cool, RN, BSN, Kim RN, BSN Entered By: Gretta Cool, RN, BSN, Kim on 07/30/2015 08:45:44 Hakes, Adnan Lenna [name] ([id number]) -------------------------------------------------------------------------------- Encounter Discharge Information Details Patient Name: [name], [name] Date of Service: 07/30/2015 8:45 AM Medical Record Number: [id number] Patient Account Number: [id number] Date of Birth/Sex: [birth year] (54 y.o. Male) Treating RN: Cornell Barman Primary Care Physician: Frazier Richards Other Clinician: Referring Physician: Frazier Richards Treating Physician/Extender: Frann Rider in Treatment: 7 Encounter Discharge Information Items Discharge Pain Level: 0 Discharge Condition: Stable Ambulatory Status: Ambulatory Discharge Destination: Home Transportation: Private Auto Accompanied By:  self Schedule Follow-up Appointment: Yes Medication Reconciliation completed Yes and provided to Patient/Care Provider: Patient Clinical Summary of Care: Declined Electronic Signature(s) Signed: 07/30/2015 2:12:38 PM By: Gretta Cool RN, BSN, Kim RN, BSN Previous Signature: 07/30/2015 9:17:28 AM Version By: Ruthine Dose Entered By: Gretta Cool RN, BSN, Kim on 07/30/2015 09:19:25 Hannum, Labrian Lenna [name] ([id number]) -------------------------------------------------------------------------------- Lower Extremity Assessment Details Patient Name: [name], [name] Date of Service: 07/30/2015 8:45 AM Medical Record Number: [id number] Patient Account Number: [id number] Date of Birth/Sex: 1961/06/22 (54 y.o. Male) Treating RN: Cornell Barman Primary Care Physician: Frazier Richards Other Clinician: Referring Physician: Frazier Richards Treating Physician/Extender: Frann Rider in Treatment: 7 Edema Assessment Assessed: [Left: No] [Right: No] E[Left: dema] [Right: :] Calf Left: Right: Point of Measurement: 34 cm From Medial Instep 39.1 cm cm Ankle Left: Right: Point of Measurement: 10 cm From Medial Instep 27.2 cm cm Vascular Assessment Pulses: Posterior Tibial Dorsalis Pedis Palpable: [Left:Yes] Doppler: [Left:Multiphasic] Extremity colors, hair growth, and conditions: Extremity Color: [Left:Normal] Hair Growth on Extremity: [Left:Yes] Temperature of Extremity: [Left:Warm] Capillary Refill: [Left:< 3 seconds] Toe Nail Assessment Left: Right: Thick: No Discolored: No Deformed: No Improper Length and Hygiene: No Electronic Signature(s) Signed: 07/30/2015 2:12:38 PM By: Gretta Cool, RN, BSN, Kim RN, BSN Entered By: Gretta Cool, RN, BSN, Kim on 07/30/2015 08:55:16 Ferrell, Matvey J. ([id number]) Bufford, Elray J. ([id number]) -------------------------------------------------------------------------------- Multi Wound Chart Details Patient Name: [name], [name] Date of Service: 07/30/2015 8:45  AM Medical Record Number: [id number] Patient Account Number: [id number] Date of Birth/Sex: [birth year] (54 y.o. Male) Treating RN: Montey Hora Primary Care Physician: Frazier Richards Other Clinician: Referring Physician: Frazier Richards Treating Physician/Extender: Frann Rider in Treatment: 7 Vital Signs Height(in): 74 Pulse(bpm): 88 Weight(lbs): 260 Blood Pressure 188/78 (mmHg): Body Mass Index(BMI): 33 Temperature(F): 98.1 Respiratory Rate 18 (breaths/min): Photos: [3:No Photos] [4:No Photos] [N/A:N/A] Wound Location: [3:Left, Medial Lower Leg] [4:Left, Lateral Lower Leg] [N/A:N/A] Wounding Event: [3:Gradually Appeared] [4:Gradually Appeared] [N/A:N/A] Primary Etiology: [3:Diabetic Wound/Ulcer of the Lower Extremity] [4:Diabetic Wound/Ulcer of the Lower Extremity] [N/A:N/A] 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] [4:Venous Leg Ulcer] [N/A:N/A] Date Acquired: [3:04/11/2015] [4:04/11/2015] [N/A:N/A] Weeks of Treatment: [3:7] [4:7] [N/A:N/A] Wound Status: [3:Open] [4:Open] [N/A:N/A] Measurements L x W x D 3.2x5.5x0.1 [4:0.6x0.4x0.1] [N/A:N/A] (cm) Area (cm) : [3:13.823] [4:0.188] [N/A:N/A] Volume (cm) : [3:1.382] [4:0.019] [N/A:N/A] % Reduction in Area: [3:3.30%] [4:-19.70%] [N/A:N/A] % Reduction in Volume: 67.80% [4:-18.70%] [N/A:N/A] Classification: [3:Grade 2] [4:Grade 2] [N/A:N/A] Periwound Skin Texture: No Abnormalities Noted [4:No Abnormalities Noted] [N/A:N/A] Periwound Skin [3:No Abnormalities Noted] [4:No Abnormalities Noted] [N/A:N/A] Moisture: Periwound Skin Color: No Abnormalities Noted [4:No Abnormalities Noted] [N/A:N/A] Tenderness on [3:No] [4:No] [N/A:N/A] Treatment Notes Electronic Signature(s) Signed: 07/30/2015 12:21:34 PM By: Montey Hora Entered By: Montey Hora on 07/30/2015 09:03:37 Englander, Dameer J. ([id number]) [name], [name]  ([id number]) -------------------------------------------------------------------------------- [place] Details Patient Name: [name], Bain J. Date of Service: 07/30/2015 8:45 AM Medical Record Number: [id number] Patient Account Number: [id number] Date of Birth/Sex: May 16, 1961 (54 y.o. Male) Treating RN: Montey Hora Primary Care Physician: Frazier Richards Other Clinician: Referring Physician: Frazier Richards Treating Physician/Extender: Frann Rider in Treatment: 7 Active Inactive Venous Leg Ulcer Nursing Diagnoses: Actual venous Insuffiency (use after diagnosis is confirmed) Goals: Patient will maintain optimal edema control Date Initiated: 06/11/2015 Goal Status: Active Patient/caregiver will verbalize understanding of disease process and disease management Date Initiated: 06/11/2015 Goal Status: Active Interventions: Assess peripheral edema status every visit. Compression as ordered Notes: Wound/Skin Impairment Nursing Diagnoses: Impaired tissue integrity Goals: Ulcer/skin breakdown will have a volume reduction of 30% by week 4 Date Initiated: 06/11/2015 Goal Status: Active Ulcer/skin breakdown will have a volume reduction of 50% by week 8 Date Initiated: 06/11/2015 Goal Status: Active Ulcer/skin breakdown will have a volume reduction of 80% by week 12 Date Initiated: 06/11/2015 Goal Status: Active Ulcer/skin breakdown will heal within 14 weeks Date Initiated: 06/11/2015 Brickell, Grayling Congress ([id number]) Goal Status: Active Interventions: Assess patient/caregiver ability to perform ulcer/skin care regimen upon admission and as needed Assess ulceration(s) every visit Notes: Electronic Signature(s) Signed: 07/30/2015 12:21:34 PM By: Montey Hora Entered By: Montey Hora on 07/30/2015 09:03:25 [name], [name] ([id number]) -------------------------------------------------------------------------------- Patient/Caregiver Education  Details Patient Name: [name] Marin. Date of Service: 07/30/2015 8:45 AM Medical Record Number: [id number] Patient Account Number: [id number] Date of Birth/Gender: 1961/04/15 (54 y.o. Male) Treating RN: Montey Hora Primary Care Physician: Frazier Richards Other Clinician: Referring Physician: Frazier Richards Treating Physician/Extender: Frann Rider in Treatment: 7 Education Assessment Education Provided To: Patient Education Topics Provided Wound/Skin Impairment: Handouts: Other: skin subs Methods: Explain/Verbal Responses: State content correctly Electronic Signature(s) Signed: 07/30/2015 12:21:34 PM By: Montey Hora Entered By: Montey Hora on 07/30/2015 09:04:08 [name], [name] ([id number]) -------------------------------------------------------------------------------- Wound Assessment Details Patient Name: [name], [name] Date of Service: 07/30/2015 8:45 AM Medical Record Number: [id number] Patient Account Number: [id number] Date of Birth/Sex: July 15, 1961 (54 y.o. Male) Treating RN: Cornell Barman Primary Care Physician: Frazier Richards Other Clinician: Referring Physician: Frazier Richards Treating Physician/Extender: Frann Rider in Treatment: 7 Wound Status Wound Number: 3 Primary Etiology: Diabetic Wound/Ulcer of the Lower Extremity Wound Location: Left, Medial Lower Leg Secondary Venous Leg Ulcer Wounding Event: Gradually Appeared Etiology: Date Acquired: 04/11/2015 Wound Status: Open Weeks Of Treatment: 7 Clustered Wound: No Photos Photo Uploaded By: Gretta Cool, RN, BSN, Kim on 07/30/2015 11:11:32 Wound Measurements Length: (cm) 3.2 Width: (cm) 5.5 Depth: (cm) 0.1 Area: (cm) 13.823 Volume: (cm) 1.382 % Reduction in Area: 3.3% % Reduction in Volume: 67.8% Wound Description Classification: Grade 2 Periwound Skin Texture Texture Color No Abnormalities Noted: No No Abnormalities Noted: No Moisture No Abnormalities  Noted:  No Treatment Notes Wound #3 (Left, Medial Lower Leg) 1. Cleansed with: Zegarra, Anav J. ([id number]) Cleanse wound with antibacterial soap and water 4. Dressing Applied: Hydrafera Blue 5. Secondary Dressing Applied ABD Pad 7. Secured with Rolena Infante to Left Lower Extremity Electronic Signature(s) Signed: 07/30/2015 2:12:38 PM By: Gretta Cool, RN, BSN, Kim RN, BSN Entered By: Gretta Cool, RN, BSN, Kim on 07/30/2015 08:53:53 [name], [name] ([id number]) -------------------------------------------------------------------------------- Wound Assessment Details Patient Name: [name], [name] Date of Service: 07/30/2015 8:45 AM Medical Record Number: [id number] Patient Account Number: [id number] Date of Birth/Sex: 1960/12/08 (54 y.o. Male) Treating RN: Cornell Barman Primary Care Physician: Frazier Richards Other Clinician: Referring Physician: Frazier Richards Treating Physician/Extender: Frann Rider in Treatment: 7 Wound Status Wound Number: 4 Primary Etiology: Diabetic Wound/Ulcer of the Lower Extremity Wound Location: Left, Lateral Lower Leg Secondary Venous Leg Ulcer Wounding Event: Gradually Appeared Etiology: Date Acquired: 04/11/2015 Wound Status: Open Weeks Of Treatment: 7 Clustered Wound: No Photos Photo Uploaded By: Gretta Cool, RN, BSN, Kim on 07/30/2015 11:11:32 Wound Measurements Length: (cm) 0.6 Width: (cm) 0.4 Depth: (cm) 0.1 Area: (cm) 0.188 Volume: (cm) 0.019 % Reduction in Area: -19.7% % Reduction in Volume: -18.7% Wound Description Classification: Grade 2 Periwound Skin Texture Texture Color No Abnormalities Noted: No No Abnormalities Noted: No Moisture No Abnormalities Noted: No Treatment Notes Wound #4 (Left, Lateral Lower Leg) 1. Cleansed with: Rauls, Virgal J. ([id number]) Cleanse wound with antibacterial soap and water 7. Secured with Rolena Infante to Left Lower Extremity Electronic Signature(s) Signed: 07/30/2015 2:12:38 PM By: Gretta Cool, RN, BSN, Kim  RN, BSN Entered By: Gretta Cool, RN, BSN, Kim on 07/30/2015 08:53:53 Schnetzer, Anthonee Lenna [name] ([id number]) -------------------------------------------------------------------------------- Vitals Details Patient Name: [name] Marin. Date of Service: 07/30/2015 8:45 AM Medical Record Number: [id number] Patient Account Number: [id number] Date of Birth/Sex: [birth year] (54 y.o. Male) Treating RN: Cornell Barman Primary Care Physician: Frazier Richards Other Clinician: Referring Physician: Frazier Richards Treating Physician/Extender: Frann Rider in Treatment: 7 Vital Signs Time Taken: 08:47 Temperature (F): 98.1 Height (in): 74 Pulse (bpm): 88 Weight (lbs): 260 Respiratory Rate (breaths/min): 18 Body Mass Index (BMI): 33.4 Blood Pressure (mmHg): 188/78 Reference Range: 80 - 120 mg / dl Electronic Signature(s) Signed: 07/30/2015 2:12:38 PM By: Gretta Cool, RN, BSN, Kim RN, BSN Entered By: Gretta Cool, RN, BSN, Kim on 07/30/2015 08:50:24
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
## 2015-08-06 ENCOUNTER — Encounter: Payer: BLUE CROSS/BLUE SHIELD | Admitting: Surgery

## 2015-08-06 DIAGNOSIS — L97222 Non-pressure chronic ulcer of left calf with fat layer exposed: Secondary | ICD-10-CM | POA: Diagnosis not present
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
## 2015-08-07 NOTE — Progress Notes (Signed)
YAMIN, SWINGLER ([id number]) Visit Report for 08/06/2015 Arrival Information Details Patient Name: MARKEITH, JUE. Date of Service: 08/06/2015 9:30 AM Medical Record Number: [id number] Patient Account Number: [id number] Date of Birth/Sex: May 02, 1961 (54 y.o. Male) Treating RN: Cornell Barman Primary Care Physician: Frazier Richards Other Clinician: Referring Physician: Frazier Richards Treating Physician/Extender: Frann Rider in Treatment: 8 Visit Information History Since Last Visit Added or deleted any medications: No Patient Arrived: Ambulatory Any new allergies or adverse reactions: No Arrival Time: 09:37 Had a fall or experienced change in No Accompanied By: self activities of daily living that may affect Transfer Assistance: None risk of falls: Patient Identification Verified: Yes Signs or symptoms of abuse/neglect since last No Secondary Verification Process Yes visito Completed: Hospitalized since last visit: No Patient Has Alerts: Yes Has Dressing in Place as Prescribed: Yes Has Compression in Place as Prescribed: Yes Pain Present Now: No Electronic Signature(s) Signed: 08/06/2015 5:26:34 PM By: Gretta Cool, RN, BSN, Kim RN, BSN Entered By: Gretta Cool, RN, BSN, Kim on 08/06/2015 09:37:40 Schipani, Kreston Lenna Sciara ([id number]) -------------------------------------------------------------------------------- Encounter Discharge Information Details Patient Name: [name], Barnaby J. Date of Service: 08/06/2015 9:30 AM Medical Record Number: [id number] Patient Account Number: [id number] Date of Birth/Sex: 06-Mar-1961 (54 y.o. Male) Treating RN: Cornell Barman Primary Care Physician: Frazier Richards Other Clinician: Referring Physician: Frazier Richards Treating Physician/Extender: Frann Rider in Treatment: 8 Encounter Discharge Information Items Discharge Pain Level: 0 Discharge Condition: Stable Ambulatory Status: Ambulatory Discharge Destination: Home Transportation:  Private Auto Accompanied By: self Schedule Follow-up Appointment: Yes Medication Reconciliation completed Yes and provided to Patient/Care Provider: Patient Clinical Summary of Care: Declined Electronic Signature(s) Signed: 08/06/2015 10:13:33 AM By: Ruthine Dose Entered By: Ruthine Dose on 08/06/2015 10:13:33 Jemison, Dasan J. ([id number]) -------------------------------------------------------------------------------- Lower Extremity Assessment Details Patient Name: Wake, Mayes J. Date of Service: 08/06/2015 9:30 AM Medical Record Number: [id number] Patient Account Number: [id number] Date of Birth/Sex: 1961/03/31 (54 y.o. Male) Treating RN: Cornell Barman Primary Care Physician: Frazier Richards Other Clinician: Referring Physician: Frazier Richards Treating Physician/Extender: Frann Rider in Treatment: 8 Edema Assessment Assessed: [Left: No] [Right: No] Edema: [Left: N] [Right: o] Vascular Assessment Pulses: Posterior Tibial Dorsalis Pedis Palpable: [Left:Yes] Extremity colors, hair growth, and conditions: Extremity Color: [Left:Normal] Hair Growth on Extremity: [Left:Yes] Temperature of Extremity: [Left:Warm] Toe Nail Assessment Left: Right: Thick: No Discolored: No Deformed: No Improper Length and Hygiene: No Electronic Signature(s) Signed: 08/06/2015 5:26:34 PM By: Gretta Cool, RN, BSN, Kim RN, BSN Entered By: Gretta Cool, RN, BSN, Kim on 08/06/2015 09:43:23 Whisnant, Jaray Lenna Sciara ([id number]) -------------------------------------------------------------------------------- Multi Wound Chart Details Patient Name: [name], Talvin J. Date of Service: 08/06/2015 9:30 AM Medical Record Number: [id number] Patient Account Number: [id number] Date of Birth/Sex: May 01, 1961 (54 y.o. Male) Treating RN: Montey Hora Primary Care Physician: Frazier Richards Other Clinician: Referring Physician: Frazier Richards Treating Physician/Extender: Frann Rider in Treatment:  8 Vital Signs Height(in): 74 Pulse(bpm): 80 Weight(lbs): 260 Blood Pressure 208/83 (mmHg): Body Mass Index(BMI): 33 Temperature(F): 97.8 Respiratory Rate 18 (breaths/min): Photos: [3:No Photos] [4:No Photos] [N/A:N/A] Wound Location: [3:Left Lower Leg - Medial] [4:Left, Lateral Lower Leg] [N/A:N/A] Wounding Event: [3:Gradually Appeared] [4:Gradually Appeared] [N/A:N/A] Primary Etiology: [3:Diabetic Wound/Ulcer of the Lower Extremity] [4:Diabetic Wound/Ulcer of the Lower Extremity] [N/A:N/A] 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] [4:Venous Leg Ulcer] [N/A:N/A] Comorbid History: [3:Hypertension, Type II Diabetes, Osteoarthritis, Neuropathy] [4:N/A] [N/A:N/A] Date Acquired: [3:04/11/2015] [4:04/11/2015] [N/A:N/A] Weeks of Treatment: [3:8] [4:8] [N/A:N/A] Wound Status: [3:Open] [4:Healed - Epithelialized] [N/A:N/A] Measurements L x W x D 2.8x5.5x0.1 [4:0x0x0] [N/A:N/A] (cm) Area (cm) : [3:12.095] [  4:0] [N/A:N/A] Volume (cm) : [3:1.21] [4:0] [N/A:N/A] % Reduction in Area: [3:15.40%] [4:100.00%] [N/A:N/A] % Reduction in Volume: 71.80% [4:100.00%] [N/A:N/A] Classification: [3:Grade 2] [4:Grade 2] [N/A:N/A] Exudate Amount: [3:Medium] [4:N/A] [N/A:N/A] Exudate Type: [3:Serous] [4:N/A] [N/A:N/A] Exudate Color: [3:amber] [4:N/A] [N/A:N/A] Wound Margin: [3:Flat and Intact] [4:N/A] [N/A:N/A] Granulation Amount: [3:Medium (34-66%)] [4:N/A] [N/A:N/A] Granulation Quality: [3:Red, Hyper-granulation] [4:N/A] [N/A:N/A] Necrotic Amount: [3:Small (1-33%)] [4:N/A] [N/A:N/A] Exposed Structures: [3:Fascia: No Fat: No Tendon: No Muscle: No] [4:N/A] [N/A:N/A] Joint: No Bone: No Limited to Skin Breakdown Periwound Skin Texture: Edema: No No Abnormalities Noted N/A Excoriation: No Induration: No Callus: No Crepitus: No Fluctuance: No Friable: No Rash: No Scarring: No Periwound Skin Maceration: Yes No Abnormalities Noted N/A Moisture: Moist: Yes Dry/Scaly: No Periwound Skin Color:  Atrophie Blanche: No No Abnormalities Noted N/A Cyanosis: No Ecchymosis: No Erythema: No Hemosiderin Staining: No Mottled: No Pallor: No Rubor: No Tenderness on No No N/A Palpation: Wound Preparation: Ulcer Cleansing: Other: N/A N/A soap an water Topical Anesthetic Applied: Other: liocaine 4% Treatment Notes Electronic Signature(s) Signed: 08/06/2015 4:21:27 PM By: Montey Hora Entered By: Montey Hora on 08/06/2015 09:58:35 Dirks, Kyron J. ([id number]) -------------------------------------------------------------------------------- Multi-Disciplinary Care Plan Details Patient Name: [name], Amari J. Date of Service: 08/06/2015 9:30 AM Medical Record Number: [id number] Patient Account Number: [id number] Date of Birth/Sex: June 27, 1961 (54 y.o. Male) Treating RN: Montey Hora Primary Care Physician: Frazier Richards Other Clinician: Referring Physician: Frazier Richards Treating Physician/Extender: Frann Rider in Treatment: 8 Active Inactive Venous Leg Ulcer Nursing Diagnoses: Actual venous Insuffiency (use after diagnosis is confirmed) Goals: Patient will maintain optimal edema control Date Initiated: 06/11/2015 Goal Status: Active Patient/caregiver will verbalize understanding of disease process and disease management Date Initiated: 06/11/2015 Goal Status: Active Interventions: Assess peripheral edema status every visit. Compression as ordered Notes: Wound/Skin Impairment Nursing Diagnoses: Impaired tissue integrity Goals: Ulcer/skin breakdown will have a volume reduction of 30% by week 4 Date Initiated: 06/11/2015 Goal Status: Active Ulcer/skin breakdown will have a volume reduction of 50% by week 8 Date Initiated: 06/11/2015 Goal Status: Active Ulcer/skin breakdown will have a volume reduction of 80% by week 12 Date Initiated: 06/11/2015 Goal Status: Active Ulcer/skin breakdown will heal within 14 weeks Date Initiated: 06/11/2015 Huberty, Grayling Congress  ([id number]) Goal Status: Active Interventions: Assess patient/caregiver ability to perform ulcer/skin care regimen upon admission and as needed Assess ulceration(s) every visit Notes: Electronic Signature(s) Signed: 08/06/2015 4:21:27 PM By: Montey Hora Entered By: Montey Hora on 08/06/2015 09:57:45 Schlitt, Olegario J. ([id number]) -------------------------------------------------------------------------------- Patient/Caregiver Education Details Patient Name: Hulda Marin. Date of Service: 08/06/2015 9:30 AM Medical Record Number: [id number] Patient Account Number: [id number] Date of Birth/Gender: 1961-07-17 (54 y.o. Male) Treating RN: Cornell Barman Primary Care Physician: Frazier Richards Other Clinician: Referring Physician: Frazier Richards Treating Physician/Extender: Frann Rider in Treatment: 8 Education Assessment Education Provided To: Patient Education Topics Provided Wound/Skin Impairment: Handouts: Caring for Your Ulcer, Other: continue wound care as prescribed Electronic Signature(s) Signed: 08/06/2015 5:26:34 PM By: Gretta Cool, RN, BSN, Kim RN, BSN Entered By: Gretta Cool, RN, BSN, Kim on 08/06/2015 10:08:06 Thumm, Dravon Lenna Sciara ([id number]) -------------------------------------------------------------------------------- Wound Assessment Details Patient Name: Scaturro, Bayden J. Date of Service: 08/06/2015 9:30 AM Medical Record Number: [id number] Patient Account Number: [id number] Date of Birth/Sex: 10/20/1961 (54 y.o. Male) Treating RN: Cornell Barman Primary Care Physician: Frazier Richards Other Clinician: Referring Physician: Frazier Richards Treating Physician/Extender: Frann Rider in Treatment: 8 Wound Status Wound Number: 3 Primary Diabetic Wound/Ulcer of the Lower Etiology: Extremity Wound Location: Left Lower Leg - Medial Secondary Venous  Leg Ulcer Wounding Event: Gradually Appeared Etiology: Date Acquired: 04/11/2015 Wound Status: Open Weeks Of  Treatment: 8 Comorbid Hypertension, Type II Diabetes, Clustered Wound: No History: Osteoarthritis, Neuropathy Photos Photo Uploaded By: Gretta Cool, RN, BSN, Kim on 08/06/2015 11:43:03 Wound Measurements Length: (cm) 2.8 Width: (cm) 5.5 Depth: (cm) 0.1 Area: (cm) 12.095 Volume: (cm) 1.21 % Reduction in Area: 15.4% % Reduction in Volume: 71.8% Wound Description Classification: Grade 2 Wound Margin: Flat and Intact Exudate Amount: Medium Exudate Type: Serous Exudate Color: amber Wound Bed Granulation Amount: Medium (34-66%) Exposed Structure Granulation Quality: Red, Hyper-granulation Fascia Exposed: No Necrotic Amount: Small (1-33%) Fat Layer Exposed: No Necrotic Quality: Adherent Slough Tendon Exposed: No Rinn, Talik J. ([id number]) Muscle Exposed: No Joint Exposed: No Bone Exposed: No Limited to Skin Breakdown Periwound Skin Texture Texture Color No Abnormalities Noted: No No Abnormalities Noted: No Callus: No Atrophie Blanche: No Crepitus: No Cyanosis: No Excoriation: No Ecchymosis: No Fluctuance: No Erythema: No Friable: No Hemosiderin Staining: No Induration: No Mottled: No Localized Edema: No Pallor: No Rash: No Rubor: No Scarring: No Moisture No Abnormalities Noted: No Dry / Scaly: No Maceration: Yes Moist: Yes Wound Preparation Ulcer Cleansing: Other: soap an water, Topical Anesthetic Applied: Other: liocaine 4%, Treatment Notes Wound #3 (Left, Medial Lower Leg) 1. Cleansed with: Cleanse wound with antibacterial soap and water 2. Anesthetic Topical Lidocaine 4% cream to wound bed prior to debridement 4. Dressing Applied: Hydrafera Blue 5. Secondary Dressing Applied ABD Pad 7. Secured with Rolena Infante to Left Lower Extremity Electronic Signature(s) Signed: 08/06/2015 5:26:34 PM By: Gretta Cool, RN, BSN, Kim RN, BSN Entered By: Gretta Cool, RN, BSN, Kim on 08/06/2015 09:46:24 Hershberger, Jasson Lenna Sciara  ([id number]) -------------------------------------------------------------------------------- Wound Assessment Details Patient Name: Arizola, Kendricks J. Date of Service: 08/06/2015 9:30 AM Medical Record Number: [id number] Patient Account Number: [id number] Date of Birth/Sex: 01-03-1961 (54 y.o. Male) Treating RN: Cornell Barman Primary Care Physician: Frazier Richards Other Clinician: Referring Physician: Frazier Richards Treating Physician/Extender: Frann Rider in Treatment: 8 Wound Status Wound Number: 4 Primary Etiology: Diabetic Wound/Ulcer of the Lower Extremity Wound Location: Left, Lateral Lower Leg Secondary Venous Leg Ulcer Wounding Event: Gradually Appeared Etiology: Date Acquired: 04/11/2015 Wound Status: Healed - Epithelialized Weeks Of Treatment: 8 Clustered Wound: No Photos Photo Uploaded By: Gretta Cool, RN, BSN, Kim on 08/06/2015 11:43:20 Wound Measurements Length: (cm) 0 % Reduction in Width: (cm) 0 % Reduction in Depth: (cm) 0 Area: (cm) 0 Volume: (cm) 0 Area: 100% Volume: 100% Wound Description Classification: Grade 2 Periwound Skin Texture Texture Color No Abnormalities Noted: No No Abnormalities Noted: No Moisture No Abnormalities Noted: No Electronic Signature(s) Signed: 08/06/2015 5:26:34 PM By: Gretta Cool, RN, BSN, Kim RN, BSN Kunst, Medhansh J. ([id number]) Entered By: Gretta Cool, RN, BSN, Kim on 08/06/2015 09:45:20 Nanney, Eryn Lenna Sciara ([id number]) -------------------------------------------------------------------------------- Vitals Details Patient Name: [name], Sherron J. Date of Service: 08/06/2015 9:30 AM Medical Record Number: [id number] Patient Account Number: [id number] Date of Birth/Sex: [birth year] (54 y.o. Male) Treating RN: Cornell Barman Primary Care Physician: Frazier Richards Other Clinician: Referring Physician: Frazier Richards Treating Physician/Extender: Frann Rider in Treatment: 8 Vital Signs Time Taken: 09:37 Temperature  (F): 97.8 Height (in): 74 Pulse (bpm): 80 Weight (lbs): 260 Respiratory Rate (breaths/min): 18 Body Mass Index (BMI): 33.4 Blood Pressure (mmHg): 208/83 Reference Range: 80 - 120 mg / dl Electronic Signature(s) Signed: 08/06/2015 5:26:34 PM By: Gretta Cool, RN, BSN, Kim RN, BSN Entered By: Gretta Cool, RN, BSN, Kim on 08/06/2015 09:41:24

## 2015-08-13 ENCOUNTER — Encounter: Payer: BLUE CROSS/BLUE SHIELD | Attending: Surgery | Admitting: Surgery

## 2015-08-13 DIAGNOSIS — I872 Venous insufficiency (chronic) (peripheral): Secondary | ICD-10-CM | POA: Diagnosis not present

## 2015-08-13 DIAGNOSIS — L97222 Non-pressure chronic ulcer of left calf with fat layer exposed: Secondary | ICD-10-CM | POA: Diagnosis present

## 2015-08-13 DIAGNOSIS — E11622 Type 2 diabetes mellitus with other skin ulcer: Secondary | ICD-10-CM | POA: Insufficient documentation

## 2015-08-13 DIAGNOSIS — I83022 Varicose veins of left lower extremity with ulcer of calf: Secondary | ICD-10-CM | POA: Diagnosis not present

## 2015-08-13 NOTE — Progress Notes (Signed)
ROLONDO, [name] ([id number]) Visit Report for 08/13/2015 Arrival Information Details Patient Name: [name] [name]. Date of Service: 08/13/2015 1:00 PM Medical Record Number: [id number] Patient Account Number: [id number] Date of Birth/Sex: 11/16/1960 (54 y.o. Male) Treating RN: Baruch Gouty, RN, BSN, Velva Harman Primary Care Physician: Frazier Richards Other Clinician: Referring Physician: Frazier Richards Treating Physician/Extender: Frann Rider in Treatment: 9 Visit Information History Since Last Visit Any new allergies or adverse reactions: No Patient Arrived: Ambulatory Had a fall or experienced change in No Arrival Time: 13:14 activities of daily living that may affect Accompanied By: self risk of falls: Transfer Assistance: None Signs or symptoms of abuse/neglect since last No Patient Identification Verified: Yes visito Secondary Verification Process Yes Hospitalized since last visit: No Completed: Has Dressing in Place as Prescribed: Yes Patient Has Alerts: Yes Has Compression in Place as Prescribed: Yes Pain Present Now: No Electronic Signature(s) Signed: 08/13/2015 1:14:36 PM By: Regan Lemming BSN, RN Entered By: Regan Lemming on 08/13/2015 13:14:36 [name], [name] ([id number]) -------------------------------------------------------------------------------- Encounter Discharge Information Details Patient Name: [name] [name] J. Date of Service: 08/13/2015 1:00 PM Medical Record Number: [id number] Patient Account Number: [id number] Date of Birth/Sex: 1961/03/11 (54 y.o. Male) Treating RN: Baruch Gouty, RN, BSN, Velva Harman Primary Care Physician: Frazier Richards Other Clinician: Referring Physician: Frazier Richards Treating Physician/Extender: Frann Rider in Treatment: 9 Encounter Discharge Information Items Schedule Follow-up Appointment: No Medication Reconciliation completed and No provided to Patient/Care [name] [name]: Patient Clinical Summary of  Care: Declined Electronic Signature(s) Signed: 08/13/2015 1:48:50 PM By: Ruthine Dose Entered By: Ruthine Dose on 08/13/2015 13:48:50 [name], [name] ([id number]) -------------------------------------------------------------------------------- Lower Extremity Assessment Details Patient Name: [name] [name] J. Date of Service: 08/13/2015 1:00 PM Medical Record Number: [id number] Patient Account Number: [id number] Date of Birth/Sex: 1961-10-29 (54 y.o. Male) Treating RN: Baruch Gouty, RN, BSN, Velva Harman Primary Care Physician: Frazier Richards Other Clinician: Referring Physician: Frazier Richards Treating Physician/Extender: Frann Rider in Treatment: 9 Vascular Assessment Pulses: Posterior Tibial Dorsalis Pedis Palpable: [Left:Yes] Extremity colors, hair growth, and conditions: Extremity Color: [Left:Normal] Hair Growth on Extremity: [Left:Yes] Temperature of Extremity: [Left:Warm] Capillary Refill: [Left:< 3 seconds] Toe Nail Assessment Left: Right: Thick: No Discolored: No Deformed: No Improper Length and Hygiene: No Electronic Signature(s) Signed: 08/13/2015 1:15:17 PM By: Regan Lemming BSN, RN Entered By: Regan Lemming on 08/13/2015 13:15:17 [name], [name] ([id number]) -------------------------------------------------------------------------------- Multi Wound Chart Details Patient Name: [name] [name] J. Date of Service: 08/13/2015 1:00 PM Medical Record Number: [id number] Patient Account Number: [id number] Date of Birth/Sex: 1961-07-20 (54 y.o. 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] [N/A:N/A] Comorbid History: [3:Hypertension, Type II Diabetes, Osteoarthritis, Neuropathy] [N/A:N/A] Date Acquired: [3:04/11/2015] [N/A:N/A] Weeks of Treatment: [3:9] [N/A:N/A] Wound Status: [3:Open] [N/A:N/A] Measurements L x W x D 2.5x5x0.1 [N/A:N/A] (cm) Area (cm) : [3:9.817] [N/A:N/A] Volume (cm) : [3:0.982] [N/A:N/A] % Reduction in Area: [3:31.30%] [N/A:N/A] % Reduction in Volume: 77.10% [N/A:N/A] Classification: [3:Grade 2] [N/A:N/A] Exudate Amount: [3:Medium] [N/A:N/A] Exudate Type: [3:Serosanguineous] [N/A:N/A] Exudate Color: [3:red, brown] [N/A:N/A] Wound Margin: [3:Flat and Intact] [N/A:N/A] Granulation Amount: [3:Large (67-100%)] [N/A:N/A] Granulation Quality: [3:Red, Hyper-granulation] [N/A:N/A] Necrotic Amount: [3:None Present (0%)] [N/A:N/A] Exposed Structures: [  3:Fascia: No Fat: No Tendon: No Muscle: No] [N/A:N/A] Joint: No Bone: No Limited to Skin Breakdown Epithelialization: Medium (34-66%) N/A N/A Periwound Skin Texture: Edema: No N/A N/A Excoriation: No Induration: No Callus: No Crepitus: No Fluctuance: No Friable: No Rash: No Scarring: No Periwound Skin Moist: Yes N/A N/A Moisture: Maceration: No Dry/Scaly: No Periwound Skin Color: Atrophie Blanche: No N/A N/A Cyanosis: No Ecchymosis: No Erythema: No Hemosiderin Staining: No Mottled: No Pallor: No Rubor: No Temperature: No Abnormality N/A N/A Tenderness on No N/A N/A Palpation: Wound Preparation: Ulcer Cleansing: Other: N/A N/A soap and water Topical Anesthetic Applied: Other: lidocaine 4% Treatment Notes Electronic Signature(s) Signed: 08/13/2015 1:32:19 PM By: Regan Lemming BSN, RN Entered By: Regan Lemming on 08/13/2015 13:32:19 [name] [name] Kitchen  ([id number]) -------------------------------------------------------------------------------- Multi-Disciplinary Care Plan Details Patient Name: [name] [name] J. Date of Service: 08/13/2015 1:00 PM Medical Record Number: [id number] Patient Account Number: [id number] Date of Birth/Sex: [birth year] (54 y.o. Male) Treating RN: Baruch Gouty, RN, BSN, Velva Harman Primary Care Physician: Frazier Richards Other Clinician: Referring Physician: Frazier Richards Treating Physician/Extender: Frann Rider in Treatment: 9 Active Inactive Venous Leg Ulcer Nursing Diagnoses: Actual venous Insuffiency (use after diagnosis is confirmed) Goals: Patient will maintain optimal edema control Date Initiated: 06/11/2015 Goal Status: Active Patient/caregiver will verbalize understanding of disease process and disease management Date Initiated: 06/11/2015 Goal Status: Active Interventions: Assess peripheral edema status every visit. Compression as ordered Notes: Wound/Skin Impairment Nursing Diagnoses: Impaired tissue integrity Goals: Ulcer/skin breakdown will have a volume reduction of 30% by week 4 Date Initiated: 06/11/2015 Goal Status: Active Ulcer/skin breakdown will have a volume reduction of 50% by week 8 Date Initiated: 06/11/2015 Goal Status: Active Ulcer/skin breakdown will have a volume reduction of 80% by week 12 Date Initiated: 06/11/2015 Goal Status: Active Ulcer/skin breakdown will heal within 14 weeks Date Initiated: 06/11/2015 [name] [name] Congress ([id number]) Goal Status: Active Interventions: Assess patient/caregiver ability to perform ulcer/skin care regimen upon admission and as needed Assess ulceration(s) every visit Notes: Electronic Signature(s) Signed: 08/13/2015 1:32:10 PM By: Regan Lemming BSN, RN Previous Signature: 08/13/2015 1:29:27 PM Version By: Regan Lemming BSN, RN Entered By: Regan Lemming on 08/13/2015 13:32:10 [name] [name] J.  ([id number]) -------------------------------------------------------------------------------- Pain Assessment Details Patient Name: [name] [name] J. Date of Service: 08/13/2015 1:00 PM Medical Record Number: [id number] Patient Account Number: [id number] Date of Birth/Sex: [birth year] (54 y.o. Male) Treating RN: Baruch Gouty, RN, BSN, Velva Harman Primary Care Physician: Frazier Richards Other Clinician: Referring Physician: Frazier Richards Treating Physician/Extender: Frann Rider in Treatment: 9 Active Problems Location of Pain Severity and Description of Pain Patient Has Paino No Site Locations Pain Management and Medication Current Pain Management: Electronic Signature(s) Signed: 08/13/2015 1:14:44 PM By: Regan Lemming BSN, RN Entered By: Regan Lemming on 08/13/2015 13:14:44 [name] [name] J. ([id number]) -------------------------------------------------------------------------------- Wound Assessment Details Patient Name: [name] [name] J. Date of Service: 08/13/2015 1:00 PM Medical Record Number: [id number] Patient Account Number: [id number] Date of Birth/Sex: 02-19-1961 (54 y.o. Male) Treating RN: Baruch Gouty, RN, BSN, Velva Harman Primary Care Physician: Frazier Richards Other Clinician: Referring Physician: Frazier Richards Treating Physician/Extender: Frann Rider in Treatment: 9 Wound Status Wound Number: 3 Primary Diabetic Wound/Ulcer of the Lower Etiology: Extremity Wound Location: Left Lower Leg - Medial Secondary Venous Leg Ulcer Wounding Event: Gradually Appeared Etiology: Date Acquired: 04/11/2015 Wound Status: Open Weeks Of Treatment: 9 Comorbid Hypertension, Type II Diabetes, Clustered Wound: No History: Osteoarthritis, Neuropathy Photos Photo Uploaded By: Regan Lemming on 08/13/2015 14:29:34 Wound Measurements Length: (cm) 2.5 Width: (cm) 5 Depth: (cm) 0.1 Area: (  cm) 9.817 Volume: (cm) 0.982 % Reduction in Area: 31.3% % Reduction in Volume:  77.1% Epithelialization: Medium (34-66%) Tunneling: No Undermining: No Wound Description Classification: Grade 2 Wound Margin: Flat and Intact Exudate Amount: Medium Exudate Type: Serosanguineous Exudate Color: red, brown Wound Bed Granulation Amount: Large (67-100%) Exposed Structure Granulation Quality: Red, Hyper-granulation Fascia Exposed: No Necrotic Amount: None Present (0%) Fat Layer Exposed: No Tendon Exposed: No [name] [name] J. ([id number]) Muscle Exposed: No Joint Exposed: No Bone Exposed: No Limited to Skin Breakdown Periwound Skin Texture Texture Color No Abnormalities Noted: No No Abnormalities Noted: No Callus: No Atrophie Blanche: No Crepitus: No Cyanosis: No Excoriation: No Ecchymosis: No Fluctuance: No Erythema: No Friable: No Hemosiderin Staining: No Induration: No Mottled: No Localized Edema: No Pallor: No Rash: No Rubor: No Scarring: No Temperature / Pain Moisture Temperature: No Abnormality No Abnormalities Noted: No Dry / Scaly: No Maceration: No Moist: Yes Wound Preparation Ulcer Cleansing: Other: soap and water, Topical Anesthetic Applied: Other: lidocaine 4%, Electronic Signature(s) Signed: 08/13/2015 1:23:57 PM By: Regan Lemming BSN, RN Previous Signature: 08/13/2015 1:23:25 PM Version By: Regan Lemming BSN, RN Entered By: Regan Lemming on 08/13/2015 13:23:57 [name] [name] J. ([id number]) -------------------------------------------------------------------------------- Vitals Details Patient Name: [name], Dagen J. Date of Service: 08/13/2015 1:00 PM Medical Record Number: [id number] Patient Account Number: [id number] Date of Birth/Sex: [birth year] (54 y.o. Male) Treating RN: Baruch Gouty, RN, BSN, Velva Harman Primary Care Physician: Frazier Richards Other Clinician: Referring Physician: Frazier Richards Treating Physician/Extender: Frann Rider in Treatment: 9 Vital Signs Time Taken: 13:14 Respiratory Rate (breaths/min): 18 Height  (in): 74 Reference Range: 80 - 120 mg / dl Weight (lbs): 260 Body Mass Index (BMI): 33.4 Electronic Signature(s) Signed: 08/13/2015 1:14:56 PM By: Regan Lemming BSN, RN Entered By: Regan Lemming on 08/13/2015 13:14:56
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
## 2015-08-15 ENCOUNTER — Encounter: Payer: Self-pay | Admitting: Urgent Care

## 2015-08-15 ENCOUNTER — Observation Stay
Admission: EM | Admit: 2015-08-15 | Discharge: 2015-08-16 | Disposition: A | Discharge status: Home / Self Care | Payer: BLUE CROSS/BLUE SHIELD | Attending: Internal Medicine | Admitting: Internal Medicine

## 2015-08-15 ENCOUNTER — Other Ambulatory Visit: Payer: Self-pay

## 2015-08-15 ENCOUNTER — Emergency Department: Payer: BLUE CROSS/BLUE SHIELD

## 2015-08-15 DIAGNOSIS — H538 Other visual disturbances: Secondary | ICD-10-CM | POA: Insufficient documentation

## 2015-08-15 DIAGNOSIS — E785 Hyperlipidemia, unspecified: Secondary | ICD-10-CM | POA: Diagnosis not present

## 2015-08-15 DIAGNOSIS — E1121 Type 2 diabetes mellitus with diabetic nephropathy: Secondary | ICD-10-CM | POA: Insufficient documentation

## 2015-08-15 DIAGNOSIS — I129 Hypertensive chronic kidney disease with stage 1 through stage 4 chronic kidney disease, or unspecified chronic kidney disease: Secondary | ICD-10-CM | POA: Insufficient documentation

## 2015-08-15 DIAGNOSIS — R778 Other specified abnormalities of plasma proteins: Secondary | ICD-10-CM

## 2015-08-15 DIAGNOSIS — R319 Hematuria, unspecified: Secondary | ICD-10-CM | POA: Diagnosis not present

## 2015-08-15 DIAGNOSIS — D631 Anemia in chronic kidney disease: Secondary | ICD-10-CM | POA: Insufficient documentation

## 2015-08-15 DIAGNOSIS — N184 Chronic kidney disease, stage 4 (severe): Secondary | ICD-10-CM | POA: Insufficient documentation

## 2015-08-15 DIAGNOSIS — E119 Type 2 diabetes mellitus without complications: Secondary | ICD-10-CM

## 2015-08-15 DIAGNOSIS — R748 Abnormal levels of other serum enzymes: Secondary | ICD-10-CM | POA: Diagnosis not present

## 2015-08-15 DIAGNOSIS — R0600 Dyspnea, unspecified: Secondary | ICD-10-CM | POA: Insufficient documentation

## 2015-08-15 DIAGNOSIS — I10 Essential (primary) hypertension: Secondary | ICD-10-CM

## 2015-08-15 DIAGNOSIS — N179 Acute kidney failure, unspecified: Secondary | ICD-10-CM

## 2015-08-15 DIAGNOSIS — D72829 Elevated white blood cell count, unspecified: Secondary | ICD-10-CM

## 2015-08-15 DIAGNOSIS — I674 Hypertensive encephalopathy: Secondary | ICD-10-CM

## 2015-08-15 DIAGNOSIS — I160 Hypertensive urgency: Secondary | ICD-10-CM | POA: Diagnosis present

## 2015-08-15 DIAGNOSIS — R809 Proteinuria, unspecified: Secondary | ICD-10-CM | POA: Diagnosis present

## 2015-08-15 DIAGNOSIS — R7989 Other specified abnormal findings of blood chemistry: Secondary | ICD-10-CM

## 2015-08-15 DIAGNOSIS — E1122 Type 2 diabetes mellitus with diabetic chronic kidney disease: Principal | ICD-10-CM | POA: Insufficient documentation

## 2015-08-15 DIAGNOSIS — D649 Anemia, unspecified: Secondary | ICD-10-CM

## 2015-08-15 DIAGNOSIS — N189 Chronic kidney disease, unspecified: Secondary | ICD-10-CM

## 2015-08-15 HISTORY — DX: Type 2 diabetes mellitus without complications: E11.9

## 2015-08-15 HISTORY — DX: Hyperlipidemia, unspecified: E78.5

## 2015-08-15 HISTORY — DX: Essential (primary) hypertension: I10

## 2015-08-15 LAB — URINALYSIS COMPLETE WITH MICROSCOPIC (ARMC ONLY)
Bacteria, UA: NONE SEEN
Bilirubin Urine: NEGATIVE
Glucose, UA: 50 mg/dL — AB
KETONES UR: NEGATIVE mg/dL
Leukocytes, UA: NEGATIVE
Nitrite: NEGATIVE
PH: 5 (ref 5.0–8.0)
PROTEIN: 100 mg/dL — AB
Specific Gravity, Urine: 1.013 (ref 1.005–1.030)

## 2015-08-15 LAB — COMPREHENSIVE METABOLIC PANEL
ALT: 11 U/L — ABNORMAL LOW (ref 17–63)
AST: 17 U/L (ref 15–41)
Albumin: 3.7 g/dL (ref 3.5–5.0)
Alkaline Phosphatase: 77 U/L (ref 38–126)
Anion gap: 8 (ref 5–15)
BILIRUBIN TOTAL: 0.5 mg/dL (ref 0.3–1.2)
BUN: 35 mg/dL — ABNORMAL HIGH (ref 6–20)
CHLORIDE: 105 mmol/L (ref 101–111)
CO2: 25 mmol/L (ref 22–32)
CREATININE: 3.26 mg/dL — AB (ref 0.61–1.24)
Calcium: 8.6 mg/dL — ABNORMAL LOW (ref 8.9–10.3)
GFR, EST AFRICAN AMERICAN: 24 mL/min — AB (ref >60)
GFR, EST NON AFRICAN AMERICAN: 20 mL/min — AB (ref >60)
Glucose, Bld: 121 mg/dL — ABNORMAL HIGH (ref 65–99)
POTASSIUM: 4 mmol/L (ref 3.5–5.1)
Sodium: 138 mmol/L (ref 135–145)
TOTAL PROTEIN: 7.9 g/dL (ref 6.5–8.1)

## 2015-08-15 LAB — CBC
HEMATOCRIT: 30.2 % — AB (ref 40.0–52.0)
Hemoglobin: 10 g/dL — ABNORMAL LOW (ref 13.0–18.0)
MCH: 26.3 pg (ref 26.0–34.0)
MCHC: 33.2 g/dL (ref 32.0–36.0)
MCV: 79.1 fL — AB (ref 80.0–100.0)
Platelets: 261 10*3/uL (ref 150–440)
RBC: 3.82 MIL/uL — ABNORMAL LOW (ref 4.40–5.90)
RDW: 15.5 % — AB (ref 11.5–14.5)
WBC: 12.5 10*3/uL — ABNORMAL HIGH (ref 3.8–10.6)

## 2015-08-15 LAB — DIFFERENTIAL
BASOS PCT: 1 %
Basophils Absolute: 0.1 10*3/uL (ref 0–0.1)
EOS ABS: 0.5 10*3/uL (ref 0–0.7)
EOS PCT: 4 %
Lymphocytes Relative: 14 %
Lymphs Abs: 1.7 10*3/uL (ref 1.0–3.6)
MONO ABS: 1 10*3/uL (ref 0.2–1.0)
MONOS PCT: 8 %
Neutro Abs: 9.1 10*3/uL — ABNORMAL HIGH (ref 1.4–6.5)
Neutrophils Relative %: 73 %

## 2015-08-15 LAB — PROTIME-INR
INR: 1.05
Prothrombin Time: 13.9 seconds (ref 11.4–15.0)

## 2015-08-15 LAB — ABO/RH: ABO/RH(D): A POS

## 2015-08-15 LAB — TYPE AND SCREEN
ABO/RH(D): A POS
Antibody Screen: NEGATIVE

## 2015-08-15 LAB — TROPONIN I: TROPONIN I: 0.04 ng/mL — AB (ref <0.031)

## 2015-08-15 LAB — APTT: APTT: 33 s (ref 24–36)

## 2015-08-15 MED ORDER — ASPIRIN EC 81 MG PO TBEC
81.0000 mg | DELAYED_RELEASE_TABLET | Freq: Every day | ORAL | Status: DC
  Administered 2015-08-16: 81 mg via ORAL
  Filled 2015-08-15: qty 1

## 2015-08-15 MED ORDER — HEPARIN SODIUM (PORCINE) 5000 UNIT/ML IJ SOLN
5000.0000 [IU] | Freq: Three times a day (TID) | INTRAMUSCULAR | Status: DC
Start: 2015-08-15 — End: 2015-08-16
  Administered 2015-08-16 (×2): 5000 [IU] via SUBCUTANEOUS
  Filled 2015-08-15 (×2): qty 1

## 2015-08-15 MED ORDER — HYDRALAZINE HCL 20 MG/ML IJ SOLN: 10.0000 mg | INTRAMUSCULAR | Status: DC | PRN

## 2015-08-15 MED ORDER — FLUORESCEIN SODIUM 1 MG OP STRP
1.0000 | ORAL_STRIP | Freq: Once | OPHTHALMIC | Status: AC
  Administered 2015-08-15: 1 via OPHTHALMIC

## 2015-08-15 MED ORDER — SODIUM CHLORIDE 0.9 % IJ SOLN
3.0000 mL | Freq: Two times a day (BID) | INTRAMUSCULAR | Status: DC
  Administered 2015-08-16: 3 mL via INTRAVENOUS

## 2015-08-15 MED ORDER — OXYCODONE HCL 5 MG PO TABS: 5.0000 mg | ORAL_TABLET | ORAL | Status: DC | PRN

## 2015-08-15 MED ORDER — SODIUM CHLORIDE 0.9 % IV SOLN
INTRAVENOUS | Status: DC
  Administered 2015-08-16: 01:00:00 via INTRAVENOUS

## 2015-08-15 MED ORDER — FLUORESCEIN SODIUM 1 MG OP STRP
ORAL_STRIP | OPHTHALMIC | Status: AC
  Administered 2015-08-15: 1 via OPHTHALMIC
  Filled 2015-08-15: qty 1

## 2015-08-15 MED ORDER — MORPHINE SULFATE (PF) 2 MG/ML IV SOLN: 2.0000 mg | INTRAVENOUS | Status: DC | PRN

## 2015-08-15 MED ORDER — ACETAMINOPHEN 325 MG PO TABS: 650.0000 mg | ORAL_TABLET | Freq: Four times a day (QID) | ORAL | Status: DC | PRN

## 2015-08-15 MED ORDER — METOPROLOL TARTRATE 25 MG PO TABS
25.0000 mg | ORAL_TABLET | Freq: Once | ORAL | Status: AC
  Administered 2015-08-15: 25 mg via ORAL
  Filled 2015-08-15: qty 1

## 2015-08-15 MED ORDER — ASPIRIN EC 325 MG PO TBEC
325.0000 mg | DELAYED_RELEASE_TABLET | Freq: Once | ORAL | Status: AC
Start: 2015-08-15 — End: 2015-08-15
  Administered 2015-08-15: 325 mg via ORAL
  Filled 2015-08-15: qty 1

## 2015-08-15 MED ORDER — ACETAMINOPHEN 650 MG RE SUPP: 650.0000 mg | Freq: Four times a day (QID) | RECTAL | Status: DC | PRN

## 2015-08-15 NOTE — ED Notes (Signed)
Hospitalist at bedside 

## 2015-08-15 NOTE — ED Notes (Signed)
Spoke with Cinda Quest, MD - order stroke protocol, however do not call code stroke at this time due to 2 days since onset of symptoms.

## 2015-08-15 NOTE — H&P (Signed)
[facility] [place] NAME: [name]    MR#:  [id number]  DATE OF BIRTH:  1960/12/03   DATE OF ADMISSION:  08/15/2015  PRIMARY CARE PHYSICIAN: Kirk Ruths., MD   REQUESTING/REFERRING PHYSICIAN: Mariea Clonts  CHIEF COMPLAINT:   Chief Complaint  Patient presents with  . Blurred Vision    HISTORY OF PRESENT ILLNESS:  [name]  is a 54 y.o. male with a known history of essential hypertension presenting with blurred vision 2 days duration Vision right eye no associated pain no headache no further symptomatology. Upon presentation to the emergency department noted to be markedly hypertensive with blood pressures greater than 200. Flurascene test negative for corneal abrasion  PAST MEDICAL HISTORY:   Past Medical History  Diagnosis Date  . Hypertension   . Diabetes mellitus without complication ([place])   . Hyperlipemia     PAST SURGICAL HISTORY:   Past Surgical History  Procedure Laterality Date  . Cholecystectomy    . Neck surgery    . Achilles tendon repair      SOCIAL HISTORY:   Social History  Substance Use Topics  . Smoking status: Never Smoker   . Smokeless tobacco: Not on file  . Alcohol Use: No    FAMILY HISTORY:   Family History  Problem Relation Age of Onset  . Hypertension Other     DRUG ALLERGIES:  No Known Allergies  REVIEW OF SYSTEMS:  REVIEW OF SYSTEMS:  CONSTITUTIONAL: Denies fevers, chills, fatigue, weakness.  EYES: Positive blurred vision, denies double vision, or eye pain.  EARS, NOSE, THROAT: Denies tinnitus, ear pain, hearing loss.  RESPIRATORY: denies cough, shortness of breath, wheezing  CARDIOVASCULAR: Denies chest pain, palpitations, edema.  GASTROINTESTINAL: Denies nausea, vomiting, diarrhea, abdominal pain.  GENITOURINARY: Denies dysuria, hematuria.  ENDOCRINE: Denies nocturia or thyroid problems. HEMATOLOGIC AND LYMPHATIC: Denies easy bruising or bleeding.  SKIN:  Denies rash or lesions.  MUSCULOSKELETAL: Denies pain in neck, back, shoulder, knees, hips, or further arthritic symptoms.  NEUROLOGIC: Denies paralysis, paresthesias.  PSYCHIATRIC: Denies anxiety or depressive symptoms. Otherwise full review of systems performed by me is negative.   MEDICATIONS AT HOME:   Prior to Admission medications   Medication Sig Start Date End Date Taking? Authorizing Provider  carvedilol (COREG) 6.25 MG tablet Take 6.25 mg by mouth 2 (two) times daily with a meal.   Yes Historical Provider, MD  ferrous sulfate 325 (65 FE) MG tablet Take 325 mg by mouth daily with breakfast.   Yes Historical Provider, MD  glimepiride (AMARYL) 2 MG tablet Take 2 mg by mouth daily with breakfast.   Yes Historical Provider, MD  sitaGLIPtin (JANUVIA) 100 MG tablet Take 100 mg by mouth daily.   Yes Historical Provider, MD      VITAL SIGNS:  Blood pressure 184/94, pulse 73, temperature 98.5 F (36.9 C), temperature source Oral, resp. rate 10, height 6\' 1"  (1.854 m), weight 255 lb (115.667 kg), SpO2 100 %.  PHYSICAL EXAMINATION:  VITAL SIGNS: Filed Vitals:   08/15/15 2330  BP: 184/94  Pulse: 73  Temp:   Resp: 10   GENERAL:54 y.o.male currently in no acute distress.  HEAD: Normocephalic, atraumatic.  EYES: Pupils equal, round, reactive to light. Extraocular muscles intact. No scleral icterus.  MOUTH: Moist mucosal membrane. Dentition intact. No abscess noted.  EAR, NOSE, THROAT: Clear without exudates. No external lesions.  NECK: Supple. No thyromegaly. No nodules. No JVD.  PULMONARY: Clear to ascultation, without wheeze  rails or rhonci. No use of accessory muscles, Good respiratory effort. good air entry bilaterally CHEST: Nontender to palpation.  CARDIOVASCULAR: S1 and S2. Regular rate and rhythm. No murmurs, rubs, or gallops. No edema. Pedal pulses 2+ bilaterally.  GASTROINTESTINAL: Soft, nontender, nondistended. No masses. Positive bowel sounds. No hepatosplenomegaly.   MUSCULOSKELETAL: No swelling, clubbing, or edema. Range of motion full in all extremities.  NEUROLOGIC: Cranial nerves II through XII are intact. Strength 5/5 all extremities including proximal/distal flexion/extension, pronator drift within normal limits No gross focal neurological deficits. Sensation intact. Reflexes intact.  SKIN: No ulceration, lesions, rashes, or cyanosis. Skin warm and dry. Turgor intact.  PSYCHIATRIC: Mood, affect within normal limits. The patient is awake, alert and oriented x 3. Insight, judgment intact.    LABORATORY PANEL:   CBC  Recent Labs Lab 08/15/15 1955  WBC 12.5*  HGB 10.0*  HCT 30.2*  PLT 261   ------------------------------------------------------------------------------------------------------------------  Chemistries   Recent Labs Lab 08/15/15 1955  NA 138  K 4.0  CL 105  CO2 25  GLUCOSE 121*  BUN 35*  CREATININE 3.26*  CALCIUM 8.6*  AST 17  ALT 11*  ALKPHOS 77  BILITOT 0.5   ------------------------------------------------------------------------------------------------------------------  Cardiac Enzymes  Recent Labs Lab 08/15/15 1955  TROPONINI 0.04*   ------------------------------------------------------------------------------------------------------------------  RADIOLOGY:  Ct Head Wo Contrast  08/15/2015   CLINICAL DATA:  Blurry vision for 2 days.  EXAM: CT HEAD WITHOUT CONTRAST  TECHNIQUE: Contiguous axial images were obtained from the base of the skull through the vertex without intravenous contrast.  COMPARISON:  None.  FINDINGS: Brain parenchyma and ventricular system are unremarkable. No mass effect, midline shift, or acute hemorrhage. Mastoid air cells are clear. Visualized paranasal sinuses are clear. Intact cranium.  IMPRESSION: Negative head CT   Electronically Signed   By: Marybelle Killings M.D.   On: 08/15/2015 20:00    EKG:   Orders placed or performed during the hospital encounter of 08/15/15  . ED EKG   . ED EKG    IMPRESSION AND PLAN:   54 year old Caucasian gentleman history of type 2 diabetes non-insulin-requiring, essential hypertension. Who originally presented with blurred vision, have markedly elevated blood pressure  1. Hypertensive urgency: Goal to lower blood pressure by 20-25%, add as needed hydralazine, continue home medications for blood pressure 2. Acute kidney injury: Avoid further nephrotoxic agents, IV fluid hydration 3. Type 2 diabetes non-insulin-requiring: Hold oral agents at insulin sliding scale 4. Venous thromboembolism prophylactic: Heparin subcutaneous    All the records are reviewed and case discussed with ED provider. Management plans discussed with the patient, family and they are in agreement.  CODE STATUS: Full  TOTAL TIME TAKING CARE OF THIS PATIENT: 45 minutes.    Jalonda Antigua,  BRIAR [email or web address].D on 08/15/2015 at 11:47 PM  Between 7am to 6pm - Pager - [phone]  After 6pm: House Pager: - [phone]  [facility]  Office  [phone]  CC: Primary care physician; Kirk Ruths., MD

## 2015-08-15 NOTE — ED Notes (Signed)
Patient presents with blurred vision in his RIGHT eye only x 2 days. Denies headache. (+) elevated BP with SBPs over 200.

## 2015-08-15 NOTE — ED Notes (Signed)
Pt reports blurred vision to right eye x 2 days.  Pt reports recent HTN.  Pt denies pain at this time.

## 2015-08-15 NOTE — ED Provider Notes (Signed)
[facility] Emergency Department Provider Note  ____________________________________________  Time seen: Approximately 8:57 PM  I have reviewed the triage vital signs and the nursing notes.   HISTORY  Chief Complaint Blurred Vision    HPI [name] is a 54 y.o. male with a history of undertreated HTN, HL, DM with presbyopia presenting with right blurred vision. Patient reports that since yesterday he has had blurred vision in his right eye. It is better if he closes his left eye. It was itchy yesterday but this has improved. He denies any diplopia, trauma, foreign body sensation, eye discharge, recent cough or cold symptoms, sore throat or rhinorrhea.  He denies any headache, changes in speech, inability to walk, numbness tickling or weakness. He states that his baseline blood pressure is 180s over 70s.   Past Medical History  Diagnosis Date  . Hypertension   . Diabetes mellitus without complication ([place])   . Hyperlipemia     Patient Active Problem List   Diagnosis Date Noted  . 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 Cholecystectomy    . Neck surgery    . Achilles tendon repair      Current Outpatient Rx  Name  Route  Sig  Dispense  Refill  . carvedilol (COREG) 6.25 MG tablet   Oral   Take 6.25 mg by mouth 2 (two) times daily with a meal.         . ferrous sulfate 325 (65 FE) MG tablet   Oral   Take 325 mg by mouth daily with breakfast.         . glimepiride (AMARYL) 2 MG tablet   Oral   Take 2 mg by mouth daily with breakfast.         . sitaGLIPtin (JANUVIA) 100 MG tablet   Oral   Take 100 mg by mouth daily.           Allergies Review of patient's allergies indicates no known allergies.  No family history on file.  Social History Social History  Substance Use Topics  . Smoking status: Never Smoker   . Smokeless tobacco:  None  . Alcohol Use: No    Review of Systems Constitutional: No fever/chills. No syncope. Eyes: Blurred vision. No double vision. ENT: No sore throat. Cardiovascular: Denies chest pain, palpitations. Respiratory: Denies shortness of breath.  No cough. Gastrointestinal: No abdominal pain.  No nausea, no vomiting.  No diarrhea.  No constipation. Genitourinary: Negative for dysuria. Musculoskeletal: Negative for back pain. Skin: Negative for rash. Neurological: Negative for headaches, focal weakness or numbness. No difficulty with gait.  10-point ROS otherwise negative.  ____________________________________________   PHYSICAL EXAM:  VITAL SIGNS: ED Triage Vitals  Enc Vitals Group     BP 08/15/15 1923 218/93 mmHg     Pulse Rate 08/15/15 1921 90     Resp 08/15/15 1923 18     Temp 08/15/15 1921 98.5 F (36.9 C)     Temp Source 08/15/15 1921 Oral     SpO2 08/15/15 1921 100 %     Weight 08/15/15 1921 255 lb (115.667 kg)     Height 08/15/15 1921 6\' 1"  (1.854 m)     Head Cir --      Peak Flow --      Pain Score 08/15/15 1924 0     Pain Loc --      Pain  Edu? --      Excl. in [place]? --     Constitutional: Alert and oriented. Well appearing and in no acute distress. Answer question appropriately. Eyes: Conjunctivae are normal.  EOMI. PERRLA. Right lower and upper eyelids were E Verdin with no evidence of foreign body. Head: Atraumatic. No raccoon eyes or Battle sign. Nose: No congestion/rhinnorhea. Mouth/Throat: Mucous membranes are moist.  Neck: No stridor.  Supple.   Cardiovascular: Normal rate, regular rhythm. No murmurs, rubs or gallops.  Respiratory: Normal respiratory effort.  No retractions. Lungs CTAB.  No wheezes, rales or ronchi. Gastrointestinal: Soft and nontender. No distention. No peritoneal signs. Musculoskeletal: No LE edema.  Neurologic: Alert and oriented 3. Speech is clear. Naming and repetition are intact. Face and smile symmetric.  EOMI. PERRLA. Initially,  right eye lateral visual fields are decreased, but repeat exam shows that all  visual fields are intact. Tongue is midline. No pronator drift. 5 out of 5 grip, biceps, triceps, hip flexors, plantar flexion and dorsiflexion. Normal sensation to light touch in the bilateral upper and lower extremities, and face. Normal heel-to-shin. Skin:  Skin is warm, dry and intact. No rash noted. Psychiatric: Mood and affect are normal. Speech and behavior are normal.  Normal judgement.  ____________________________________________   LABS (all labs ordered are listed, but only abnormal results are displayed)  Labs Reviewed  CBC - Abnormal; Notable for the following:    WBC 12.5 (*)    RBC 3.82 (*)    Hemoglobin 10.0 (*)    HCT 30.2 (*)    MCV 79.1 (*)    RDW 15.5 (*)    All other components within normal limits  DIFFERENTIAL - Abnormal; Notable for the following:    Neutro Abs 9.1 (*)    All other components within normal limits  COMPREHENSIVE METABOLIC PANEL - Abnormal; Notable for the following:    Glucose, Bld 121 (*)    BUN 35 (*)    Creatinine, Ser 3.26 (*)    Calcium 8.6 (*)    ALT 11 (*)    GFR calc non Af Amer 20 (*)    GFR calc Af Amer 24 (*)    All other components within normal limits  TROPONIN I - Abnormal; Notable for the following:    Troponin I 0.04 (*)    All other components within normal limits  URINALYSIS COMPLETEWITH MICROSCOPIC ([facility] ONLY) - Abnormal; Notable for the following:    Color, Urine STRAW (*)    APPearance CLEAR (*)    Glucose, UA 50 (*)    Hgb urine dipstick 1+ (*)    Protein, ur 100 (*)    Squamous Epithelial / LPF 0-5 (*)    All other components within normal limits  PROTIME-INR  APTT  TYPE AND SCREEN  ABO/RH   ____________________________________________  EKG  ED ECG REPORT I, Eula Listen, the attending physician, personally viewed and interpreted this ECG.   Date: 08/15/2015  EKG Time: 1936  Rate: 86normal sinus rhythm  Rhythm:  normal sinus rhythm  Axis: Leftward  Intervals:right bundle branch block  ST&T Change: No ST elevation  ____________________________________________  RADIOLOGY  Ct Head Wo Contrast  08/15/2015   CLINICAL DATA:  Blurry vision for 2 days.  EXAM: CT HEAD WITHOUT CONTRAST  TECHNIQUE: Contiguous axial images were obtained from the base of the skull through the vertex without intravenous contrast.  COMPARISON:  None.  FINDINGS: Brain parenchyma and ventricular system are unremarkable. No mass effect, midline shift, or acute  hemorrhage. Mastoid air cells are clear. Visualized paranasal sinuses are clear. Intact cranium.  IMPRESSION: Negative head CT   Electronically Signed   By: Marybelle Killings M.D.   On: 08/15/2015 20:00    ____________________________________________   PROCEDURES  Procedure(s) performed: None  Critical Care performed: No ____________________________________________   INITIAL IMPRESSION / ASSESSMENT AND PLAN / ED COURSE  Pertinent labs & imaging results that were available during my care of the patient were reviewed by me and considered in my medical decision making (see chart for details).  54 y.o. male with a history of HTN and currently hypertensive presenting with 2 days of right blurred vision. Consider ophthalmic causes including corneal abrasion. Consider neurologic causes such as stroke.  From triage, the patient did receive lab work. He reports that he has a history of renal insufficiency but is not sure what his creatinine is at baseline. Today he has under controlled hypertension and a creatinine of greater than 3. I am concerned that this may be acute on chronic renal insufficiency and he will need admission for this. As part of the triage bundle, troponin was ordered. It is elevated, but the patient does not have any chest pain, shortness of breath, palpitations, syncope, or ischemic changes on his EKG. This is a test that will need to be trended. The patient also  has a slightly elevated white blood cell count, but does not give any recent history of infectious type symptoms.  Patient has a CT scan from triage that does not show any acute intracranial process. I will plan to floor seen his eye and look for corneal abrasion. Patient will need admission for his renal insufficiency, and blurred vision if the etiology is unclear after the eye exam.  10:13 PM Fluorescein exam does not show any evidence of corneal abrasion. The patient's blood pressure has improved to the 190s over 90s with metoprolol. I will plan admission to the hospital. ____________________________________________  FINAL CLINICAL IMPRESSION(S) / ED DIAGNOSES  Final diagnoses:  Renal failure (ARF), acute on chronic [facility])  Essential hypertension      NEW MEDICATIONS STARTED DURING THIS VISIT:  New Prescriptions   No medications on file     Eula Listen, MD 08/15/15 2213

## 2015-08-16 ENCOUNTER — Observation Stay: Payer: BLUE CROSS/BLUE SHIELD

## 2015-08-16 ENCOUNTER — Observation Stay
Admit: 2015-08-16 | Discharge: 2015-08-16 | Disposition: A | Discharge status: Home / Self Care | Payer: BLUE CROSS/BLUE SHIELD | Attending: Internal Medicine | Admitting: Internal Medicine

## 2015-08-16 DIAGNOSIS — N189 Chronic kidney disease, unspecified: Secondary | ICD-10-CM

## 2015-08-16 DIAGNOSIS — D72829 Elevated white blood cell count, unspecified: Secondary | ICD-10-CM

## 2015-08-16 DIAGNOSIS — R7989 Other specified abnormal findings of blood chemistry: Secondary | ICD-10-CM

## 2015-08-16 DIAGNOSIS — E119 Type 2 diabetes mellitus without complications: Secondary | ICD-10-CM

## 2015-08-16 DIAGNOSIS — R778 Other specified abnormalities of plasma proteins: Secondary | ICD-10-CM

## 2015-08-16 DIAGNOSIS — I674 Hypertensive encephalopathy: Secondary | ICD-10-CM

## 2015-08-16 DIAGNOSIS — N179 Acute kidney failure, unspecified: Secondary | ICD-10-CM

## 2015-08-16 LAB — BASIC METABOLIC PANEL
Anion gap: 6 (ref 5–15)
BUN: 35 mg/dL — AB (ref 6–20)
CHLORIDE: 107 mmol/L (ref 101–111)
CO2: 24 mmol/L (ref 22–32)
CREATININE: 3.25 mg/dL — AB (ref 0.61–1.24)
Calcium: 8.3 mg/dL — ABNORMAL LOW (ref 8.9–10.3)
GFR calc Af Amer: 24 mL/min — ABNORMAL LOW (ref >60)
GFR calc non Af Amer: 20 mL/min — ABNORMAL LOW (ref >60)
GLUCOSE: 91 mg/dL (ref 65–99)
Potassium: 3.7 mmol/L (ref 3.5–5.1)
SODIUM: 137 mmol/L (ref 135–145)

## 2015-08-16 LAB — CBC
HCT: 29.9 % — ABNORMAL LOW (ref 40.0–52.0)
Hemoglobin: 9.8 g/dL — ABNORMAL LOW (ref 13.0–18.0)
MCH: 25.7 pg — ABNORMAL LOW (ref 26.0–34.0)
MCHC: 32.7 g/dL (ref 32.0–36.0)
MCV: 78.7 fL — AB (ref 80.0–100.0)
PLATELETS: 301 10*3/uL (ref 150–440)
RBC: 3.8 MIL/uL — ABNORMAL LOW (ref 4.40–5.90)
RDW: 15.9 % — AB (ref 11.5–14.5)
WBC: 12.9 10*3/uL — AB (ref 3.8–10.6)

## 2015-08-16 LAB — TROPONIN I: TROPONIN I: 0.04 ng/mL — AB (ref <0.031)

## 2015-08-16 LAB — PHOSPHORUS: Phosphorus: 4 mg/dL (ref 2.5–4.6)

## 2015-08-16 LAB — PROTEIN / CREATININE RATIO, URINE
CREATININE, URINE: 100 mg/dL
Protein Creatinine Ratio: 0.67 mg/mg{Cre} — ABNORMAL HIGH (ref 0.00–0.15)
Total Protein, Urine: 67 mg/dL

## 2015-08-16 LAB — GLUCOSE, CAPILLARY
Glucose-Capillary: 87 mg/dL (ref 65–99)
Glucose-Capillary: 127 mg/dL — ABNORMAL HIGH (ref 65–99)
Glucose-Capillary: 104 mg/dL — ABNORMAL HIGH (ref 65–99)

## 2015-08-16 LAB — HEMOGLOBIN A1C: Hgb A1c MFr Bld: 5.9 % (ref 4.0–6.0)

## 2015-08-16 LAB — URIC ACID: Uric Acid, Serum: 6.3 mg/dL (ref 4.4–7.6)

## 2015-08-16 MED ORDER — AMLODIPINE BESYLATE 10 MG PO TABS
10.0000 mg | ORAL_TABLET | Freq: Every day | ORAL | Status: DC
  Administered 2015-08-16: 10 mg via ORAL
  Filled 2015-08-16: qty 1

## 2015-08-16 MED ORDER — CLONIDINE HCL 0.1 MG PO TABS: 0.1000 mg | ORAL_TABLET | Freq: Two times a day (BID) | ORAL | Status: DC

## 2015-08-16 MED ORDER — FERROUS SULFATE 325 (65 FE) MG PO TABS
325.0000 mg | ORAL_TABLET | Freq: Every day | ORAL | Status: DC
  Administered 2015-08-16: 325 mg via ORAL
  Filled 2015-08-16: qty 1

## 2015-08-16 MED ORDER — CLONIDINE HCL 0.1 MG PO TABS
0.1000 mg | ORAL_TABLET | Freq: Two times a day (BID) | ORAL | Status: DC
  Administered 2015-08-16: 0.1 mg via ORAL
  Filled 2015-08-16: qty 1

## 2015-08-16 MED ORDER — AMLODIPINE BESYLATE 10 MG PO TABS: 10.0000 mg | ORAL_TABLET | Freq: Every day | ORAL | Status: DC

## 2015-08-16 MED ORDER — INSULIN ASPART 100 UNIT/ML ~~LOC~~ SOLN
0.0000 [IU] | Freq: Three times a day (TID) | SUBCUTANEOUS | Status: DC
  Administered 2015-08-16: 1 [IU] via SUBCUTANEOUS
  Filled 2015-08-16: qty 1

## 2015-08-16 MED ORDER — ISOSORBIDE MONONITRATE ER 30 MG PO TB24: 30.0000 mg | ORAL_TABLET | Freq: Every day | ORAL | Status: DC

## 2015-08-16 MED ORDER — LOSARTAN POTASSIUM 50 MG PO TABS: 50.0000 mg | ORAL_TABLET | Freq: Every day | ORAL | Status: AC

## 2015-08-16 MED ORDER — LABETALOL HCL 200 MG PO TABS
100.0000 mg | ORAL_TABLET | Freq: Two times a day (BID) | ORAL | Status: DC
  Administered 2015-08-16: 100 mg via ORAL
  Filled 2015-08-16: qty 1

## 2015-08-16 MED ORDER — CARVEDILOL 6.25 MG PO TABS
6.2500 mg | ORAL_TABLET | Freq: Two times a day (BID) | ORAL | Status: DC
  Administered 2015-08-16: 6.25 mg via ORAL
  Filled 2015-08-16: qty 1

## 2015-08-16 MED ORDER — ASPIRIN 81 MG PO TBEC: 81.0000 mg | DELAYED_RELEASE_TABLET | Freq: Every day | ORAL | Status: DC

## 2015-08-16 MED ORDER — LOSARTAN POTASSIUM 50 MG PO TABS: 50.0000 mg | ORAL_TABLET | Freq: Every day | ORAL | Status: DC

## 2015-08-16 MED ORDER — HYDRALAZINE HCL 50 MG PO TABS
50.0000 mg | ORAL_TABLET | Freq: Three times a day (TID) | ORAL | Status: DC
  Administered 2015-08-16: 50 mg via ORAL
  Filled 2015-08-16: qty 1

## 2015-08-16 MED ORDER — ISOSORBIDE MONONITRATE ER 30 MG PO TB24
30.0000 mg | ORAL_TABLET | Freq: Every day | ORAL | Status: DC
  Administered 2015-08-16: 30 mg via ORAL
  Filled 2015-08-16: qty 1

## 2015-08-16 MED ORDER — LABETALOL HCL 100 MG PO TABS: 100.0000 mg | ORAL_TABLET | Freq: Two times a day (BID) | ORAL | Status: AC

## 2015-08-16 MED ORDER — INSULIN ASPART 100 UNIT/ML ~~LOC~~ SOLN: 0.0000 [IU] | Freq: Every day | SUBCUTANEOUS | Status: DC

## 2015-08-16 NOTE — Progress Notes (Signed)
MD paged and updated with f/u BP after amlodipine administration, MD to reasses BP medications and make adjustments.

## 2015-08-16 NOTE — Progress Notes (Signed)
Pt. Discharged to home via wc. Discharge instructions and medication regimen reviewed at bedside with patient. Pt. verbalizes understanding of instructions and medication regimen. Per Dr. Ether Griffins instruction, educated patient to take BP Qmorning, and if SBP< 135, Dr. Clayton Bibles asks him to hold his labetalol. Per MD, RN told patient that his losartan is his most important medication and to not miss it. Patient verbalizes understanding of this. Patients medications returned from pharmacy and given to patient at discharge. Patient assessment unchanged from this morning. TELE and IV discontinued per policy.

## 2015-08-16 NOTE — Care Management Note (Signed)
Case Management Note  Patient Details  Name: GEARY RUFO MRN: [id number] Date of Birth: 1961-09-01  Subjective/Objective:                    Action/Plan: Met briefly with patient to discuss discharge planning. He is independent with daily activities. His PCP is Dr. Frazier Richards and he states he is current with DR. Anderson. He denies RNCM needs.    Expected Discharge Date:                  Expected Discharge Plan:     In-House Referral:     Discharge planning Services  CM Consult  Post Acute Care Choice:    Choice offered to:  Patient  DME Arranged:    DME Agency:     HH Arranged:    [place] Agency:     Status of Service:  Completed, signed off  Medicare Important Message Given:    Date Medicare IM Given:    Medicare IM give by:    Date Additional Medicare IM Given:    Additional Medicare Important Message give by:     If discussed at [place] of Stay Meetings, dates discussed:    Additional Comments:  Marshell Garfinkel, RN 08/16/2015, 1:51 PM

## 2015-08-16 NOTE — Discharge Summary (Signed)
[name] at [place] NAME: [name]    MR#:  [id number]  DATE OF BIRTH:  [birth year]  DATE OF ADMISSION:  08/15/2015 ADMITTING PHYSICIAN: Lytle Butte, MD  DATE OF DISCHARGE: No discharge date for patient encounter.  PRIMARY CARE PHYSICIAN: Kirk Ruths., MD     ADMISSION DIAGNOSIS:  Proteinuria [R80.9] Blurred vision, right eye [H53.8] Renal failure (ARF), acute on chronic (HCC) [N17.9, N18.9] Essential hypertension [I10] Anemia, unspecified anemia type [D64.9]  DISCHARGE DIAGNOSIS:  Principal Problem:   Malignant hypertensive urgency Active Problems:   Encephalopathy, hypertensive   Acute on chronic renal failure (HCC)   Elevated troponin   Diabetes mellitus (HCC)   Leukocytosis   SECONDARY DIAGNOSIS:   Past Medical History  Diagnosis Date  . Hypertension   . Diabetes mellitus without complication ([place])   . Hyperlipemia     .pro HOSPITAL COURSE:  The patient is 54 year old Caucasian male with past medical history significant for history of hypertension, diabetes, hyperlipidemia who presented to the hospital with complaints of blurred vision in the right eye. Patient's blood pressure was noted to be markedly elevated and he was admitted for management of his malignant hypertension. Patient's kidney function was found to be abnormal. His creatinine level of 2.26 on admission. Patient was also noted to have mild elevation of troponin 0.4 with no fluctuations , thought to be due to demand ischemia. Patient's blood pressure medications were advanced, with improvement of his blood pressure readings and his overall condition. Patient was seen by nephrologist, who recommended addition of losartan and follow-up with him as outpatient. Lab studies were ordered as well as ultrasound of kidneys which showed no hydronephrosis Discussion by problem 1. Malignant essential hypertension, continue patient on labetalol, Norvasc  and losartan, following blood pressure readings closely. Patient is to follow-up with Dr. Juleen China as outpatient for management of his blood pressure 2. Elevated troponin, likely demand ischemia. Patient is to continue aspirin as well as Imdur. Echocardiogram was normal. Patient's to follow up with cardiologist as outpatient for outpatient evaluation 3. CK D stage IV, likely worsened condition. Over the period of time due to malignant essential hypertension, nephrology follow-up as outpatient. Patient is initiated on losartan 4. Diabetes mellitus type 2. Hemoglobin A1c not 5.9. Continue diabetic diet and current management 5. Leukocytosis of unclear etiology, no signs of infection, not on antibiotic therapy. Follow patient's white blood cell count as outpatient  DISCHARGE CONDITIONS:   Stable  CONSULTS OBTAINED:    Dr. Juleen China  DRUG ALLERGIES:  No Known Allergies  DISCHARGE MEDICATIONS:   Current Discharge Medication List    START taking these medications   Details  amLODipine (NORVASC) 10 MG tablet Take 1 tablet (10 mg total) by mouth daily. Qty: 30 tablet, Refills: 6    aspirin EC 81 MG EC tablet Take 1 tablet (81 mg total) by mouth daily. Qty: 30 tablet, Refills: 6    cloNIDine (CATAPRES) 0.1 MG tablet Take 1 tablet (0.1 mg total) by mouth 2 (two) times daily. Qty: 60 tablet, Refills: 11    isosorbide mononitrate (IMDUR) 30 MG 24 hr tablet Take 1 tablet (30 mg total) by mouth daily. Qty: 30 tablet, Refills: 3    labetalol (NORMODYNE) 100 MG tablet Take 1 tablet (100 mg total) by mouth 2 (two) times daily. Qty: 60 tablet, Refills: 6      CONTINUE these medications which have NOT CHANGED   Details  ferrous sulfate 325 (65 FE) MG  tablet Take 325 mg by mouth daily with breakfast.    glimepiride (AMARYL) 2 MG tablet Take 2 mg by mouth daily with breakfast.    sitaGLIPtin (JANUVIA) 100 MG tablet Take 100 mg by mouth daily.      STOP taking these medications     carvedilol  (COREG) 6.25 MG tablet          DISCHARGE INSTRUCTIONS:    Patient is to follow-up with Dr. Ouida Sills and Dr. Juleen China as well as Dr. Clayborn Bigness within 1-2 weeks after discharge  If you experience worsening of your admission symptoms, develop shortness of breath, life threatening emergency, suicidal or homicidal thoughts you must seek medical attention immediately by calling 911 or calling your MD immediately  if symptoms less severe.  You Must read complete instructions/literature along with all the possible adverse reactions/side effects for all the Medicines you take and that have been prescribed to you. Take any new Medicines after you have completely understood and accept all the possible adverse reactions/side effects.   Please note  You were cared for by a hospitalist during your hospital stay. If you have any questions about your discharge medications or the care you received while you were in the hospital after you are discharged, you can call the unit and asked to speak with the hospitalist on call if the hospitalist that took care of you is not available. Once you are discharged, your primary care physician will handle any further medical issues. Please note that NO REFILLS for any discharge medications will be authorized once you are discharged, as it is imperative that you return to your primary care physician (or establish a relationship with a primary care physician if you do not have one) for your aftercare needs so that they can reassess your need for medications and monitor your lab values.    Today   CHIEF COMPLAINT:   Chief Complaint  Patient presents with  . Blurred Vision    HISTORY OF PRESENT ILLNESS:  [name]  is a 54 y.o. male with a known history of hypertension, diabetes, hyperlipidemia who presented to the hospital with complaints of blurred vision in the right eye. Patient's blood pressure was noted to be markedly elevated and he was admitted for  management of his malignant hypertension. Patient's kidney function was found to be abnormal. His creatinine level of 2.26 on admission. Patient was also noted to have mild elevation of troponin 0.4 with no fluctuations , thought to be due to demand ischemia. Patient's blood pressure medications were advanced, with improvement of his blood pressure readings and his overall condition. Patient was seen by nephrologist, who recommended addition of losartan and follow-up with him as outpatient. Lab studies were ordered as well as ultrasound of kidneys which showed no hydronephrosis Discussion by problem 1. Malignant essential hypertension, continue patient on labetalol, Norvasc and losartan, following blood pressure readings closely. Patient is to follow-up with Dr. Juleen China as outpatient for management of his blood pressure 2. Elevated troponin, likely demand ischemia. Patient is to continue aspirin as well as Imdur. Echocardiogram was normal. Patient's to follow up with cardiologist as outpatient for outpatient evaluation 3. CK D stage IV, likely worsened condition. Over the period of time due to malignant essential hypertension, nephrology follow-up as outpatient. Patient is initiated on losartan 4. Diabetes mellitus type 2. Hemoglobin A1c not 5.9. Continue diabetic diet and current management 5. Leukocytosis of unclear etiology, no signs of infection, not on antibiotic therapy. Follow patient's white blood cell  count as outpatient    VITAL SIGNS:  Blood pressure 164/76, pulse 74, temperature 98.1 F (36.7 C), temperature source Oral, resp. rate 18, height 6\' 1"  (1.854 m), weight 121.473 kg (267 lb 12.8 oz), SpO2 100 %.  I/O:   Intake/Output Summary (Last 24 hours) at 08/16/15 1615 Last data filed at 08/16/15 1555  Gross per 24 hour  Intake    480 ml  Output   1500 ml  Net  -1020 ml    PHYSICAL EXAMINATION:  GENERAL:  54 y.o.-year-old patient lying in the bed with no acute distress.  EYES:  Pupils equal, round, reactive to light and accommodation. No scleral icterus. Extraocular muscles intact.  HEENT: Head atraumatic, normocephalic. Oropharynx and nasopharynx clear.  NECK:  Supple, no jugular venous distention. No thyroid enlargement, no tenderness.  LUNGS: Normal breath sounds bilaterally, no wheezing, rales,rhonchi or crepitation. No use of accessory muscles of respiration.  CARDIOVASCULAR: S1, S2 normal. No murmurs, rubs, or gallops.  ABDOMEN: Soft, non-tender, non-distended. Bowel sounds present. No organomegaly or mass.  EXTREMITIES: No pedal edema, cyanosis, or clubbing.  NEUROLOGIC: Cranial nerves II through XII are intact. Muscle strength 5/5 in all extremities. Sensation intact. Gait not checked.  PSYCHIATRIC: The patient is alert and oriented x 3.  SKIN: No obvious rash, lesion, or ulcer.   DATA REVIEW:   CBC  Recent Labs Lab 08/16/15 0047  WBC 12.9*  HGB 9.8*  HCT 29.9*  PLT 301    Chemistries   Recent Labs Lab 08/15/15 1955 08/16/15 0047  NA 138 137  K 4.0 3.7  CL 105 107  CO2 25 24  GLUCOSE 121* 91  BUN 35* 35*  CREATININE 3.26* 3.25*  CALCIUM 8.6* 8.3*  AST 17  --   ALT 11*  --   ALKPHOS 77  --   BILITOT 0.5  --     Cardiac Enzymes  Recent Labs Lab 08/16/15 0047  TROPONINI 0.04*    Microbiology Results  Results for orders placed or performed in visit on 11/09/14  Wound Aerobic Culture     Status: None   Collection Time: 11/28/14  8:55 AM  Result Value Ref Range Status   Micro Text Report   Final       SOURCE: LEFT LOWER LEG    ORGANISM 1                RARE Staphylococcus aureus   COMMENT                   -   GRAM STAIN                NO WHITE BLOOD CELLS   GRAM STAIN                RARE GRAM VARIABLE ROD   GRAM STAIN                RARE GRAM POSITIVE COCCI   ANTIBIOTIC                    ORG#1     CIPROFLOXACIN                 S         CLINDAMYCIN                   R         ERYTHROMYCIN  R          GENTAMICIN                    S         LEVOFLOXACIN                  S         LINEZOLID                     S         OXACILLIN                     S         TIGECYCLINE                   S         Cefoxitin Screen              NEGATIVE  Inducible Clindamycin ResistanPOSITIVE  Trimethoprim/Sulfamethoxazole S             RADIOLOGY:  Ct Head Wo Contrast  08/15/2015   CLINICAL DATA:  Blurry vision for 2 days.  EXAM: CT HEAD WITHOUT CONTRAST  TECHNIQUE: Contiguous axial images were obtained from the base of the skull through the vertex without intravenous contrast.  COMPARISON:  None.  FINDINGS: Brain parenchyma and ventricular system are unremarkable. No mass effect, midline shift, or acute hemorrhage. Mastoid air cells are clear. Visualized paranasal sinuses are clear. Intact cranium.  IMPRESSION: Negative head CT   Electronically Signed   By: Marybelle Killings M.D.   On: 08/15/2015 20:00   US Renal  08/16/2015   CLINICAL DATA:  Stage IV chronic renal disease  EXAM: RENAL / URINARY TRACT ULTRASOUND COMPLETE  COMPARISON:  None.  FINDINGS: Right Kidney:  Length: 11.9 cm. Echogenicity and renal cortical thickness are within normal limits. No mass, perinephric fluid, or hydronephrosis visualized. No sonographically demonstrable calculus or ureterectasis.  Left Kidney:  Length: 10.8 cm. Echogenicity and renal cortical thickness are within normal limits. No mass, perinephric fluid, or hydronephrosis visualized. No sonographically demonstrable calculus or ureterectasis.  Bladder:  Appears normal for degree of bladder distention.  IMPRESSION: Study within normal limits.   Electronically Signed   By: Lowella Grip III M.D.   On: 08/16/2015 14:42    EKG:   Orders placed or performed during the hospital encounter of 08/15/15  . ED EKG  . ED EKG      Management plans discussed with the patient, family and they are in agreement.  CODE STATUS:     Code Status Orders        Start     Ordered    08/15/15 2328  Full code   Continuous     08/15/15 2328      TOTAL TIME TAKING CARE OF THIS PATIENT: 45 minutes.  Management was discussed with Dr. Charlesetta Shanks M.D on 08/16/2015 at 4:15 PM  Between 7am to 6pm - Pager - [phone]  After 6pm go to [email or web address] - password [email or web address] [facility]  Office  [phone]  CC: Primary care physician; Kirk Ruths., MD

## 2015-08-16 NOTE — Progress Notes (Signed)
Dr Clayton Bibles updated on last BP  Filed Vitals:   08/16/15 1708  BP: 159/75  Pulse:   Temp:   Resp:    Per MD, pt okay to discharge.

## 2015-08-16 NOTE — Progress Notes (Addendum)
Per Dr. Keenan Bachelor discharge orders, MD paged and made aware of most recent BP.   Filed Vitals:   08/16/15 1543  BP: 164/76  Pulse: 74  Temp:   Resp:    MD to add clonidine and instructed RN to give now, wait 1.5hr and recheck BP. Discharge orders say pt can discharge once SBP<150.

## 2015-08-16 NOTE — Consult Note (Signed)
[facility]  CONSULT NOTE    Date: 08/16/2015                  Patient Name:  [name]  MRN: [id number]  DOB: 24-Apr-1961  Age / Sex: 54 y.o., male         PCP: Kirk Ruths., MD                 Service Requesting Consult: Dr. Ether Griffins                 Reason for Consult: Chronic Kidney Disease stage IV            History of Present Illness: Mr. [name] is a 54 y.o. white male with diabetes mellitus type II noninsulin dependent, hypertension, hyperlipidemia, anemia, peripheral vascular disease, cholecystectomy, right achilles tendon tear and neck surgery who was admitted to [facility] on 08/15/2015 for Proteinuria [R80.9] Blurred vision, right eye [H53.8] Renal failure (ARF), acute on chronic ([place]) [N17.9, N18.9] Essential hypertension [I10] Anemia, unspecified anemia type [D64.9]   Patient states that for 3-4 weeks he has been feeling weak and lethargic. His vision started to get blurry 3 days ago. He presented to the ED where he was found to have hypertensive urgency. Given IV hydralazine. Started on PO imdur, amlodipine and carvedilol.   Nephrology consulted for chronic kidney disease stage IV with proteinuria and hematuria.   Patient states he was diagnosed with hypertension for approximately 3 years. He is currently on carvedilol only for blood pressure. He does not check his blood pressure at home. He does not follow a low salt diet.   Patient states he has been diagnosed with diabetes mellitus type II noninsulin dependent for more than 20 years. Denies using insulin. Does not follow with an eye doctor and state he is unsure if he has diabetic eye disease  He denies use of nonsteroidal anti-inflammatory agents.    Medications: Outpatient medications: Prescriptions prior to admission  Medication Sig Dispense Refill Last Dose  . carvedilol (COREG) 6.25 MG tablet Take 6.25 mg by mouth 2 (two) times daily with a meal.     . ferrous sulfate  325 (65 FE) MG tablet Take 325 mg by mouth daily with breakfast.     . glimepiride (AMARYL) 2 MG tablet Take 2 mg by mouth daily with breakfast.     . sitaGLIPtin (JANUVIA) 100 MG tablet Take 100 mg by mouth daily.       Current medications: Current Facility-Administered Medications  Medication Dose Route Frequency Provider Last Rate Last Dose  . acetaminophen (TYLENOL) tablet 650 mg  650 mg Oral Q6H PRN Lytle Butte, MD       Or  . acetaminophen (TYLENOL) suppository 650 mg  650 mg Rectal Q6H PRN Lytle Butte, MD      . amLODipine (NORVASC) tablet 10 mg  10 mg Oral Daily Theodoro Grist, MD   10 mg at 08/16/15 1051  . aspirin EC tablet 81 mg  81 mg Oral Daily Lytle Butte, MD   81 mg at 08/16/15 0908  . ferrous sulfate tablet 325 mg  325 mg Oral Q breakfast Lytle Butte, MD   325 mg at 08/16/15 3382  . heparin injection 5,000 Units  5,000 Units Subcutaneous 3 times per day Lytle Butte, MD   5,000 Units at 08/16/15 1357  . insulin aspart (novoLOG) injection 0-5 Units  0-5 Units Subcutaneous QHS Lytle Butte, MD  0 Units at 08/16/15 0051  . insulin aspart (novoLOG) injection 0-9 Units  0-9 Units Subcutaneous TID WC Lytle Butte, MD   1 Units at 08/16/15 1159  . isosorbide mononitrate (IMDUR) 24 hr tablet 30 mg  30 mg Oral Daily Theodoro Grist, MD   30 mg at 08/16/15 0908  . labetalol (NORMODYNE) tablet 100 mg  100 mg Oral BID Theodoro Grist, MD   100 mg at 08/16/15 1356  . morphine 2 MG/ML injection 2 mg  2 mg Intravenous Q4H PRN Lytle Butte, MD      . oxyCODONE (Oxy IR/ROXICODONE) immediate release tablet 5 mg  5 mg Oral Q4H PRN Lytle Butte, MD      . sodium chloride 0.9 % injection 3 mL  3 mL Intravenous Q12H Lytle Butte, MD   3 mL at 08/16/15 0909      Allergies: No Known Allergies    Past Medical History: Past Medical History  Diagnosis Date  . Hypertension   . Diabetes mellitus without complication ([place])   . Hyperlipemia      Past Surgical History: Past Surgical  History  Procedure Laterality Date  . Cholecystectomy    . Neck surgery    . Achilles tendon repair       Family History: Family History  Problem Relation Age of Onset  . Hypertension Other      Social History: Social History   Social History  . Marital Status: Single    Spouse Name: N/A  . Number of Children: N/A  . Years of Education: N/A   Occupational History  . Not on file.   Social History Main Topics  . Smoking status: Never Smoker   . Smokeless tobacco: Not on file  . Alcohol Use: No  . Drug Use: Not on file  . Sexual Activity: Not on file   Other Topics Concern  . Not on file   Social History Narrative  . No narrative on file     Review of Systems: Review of Systems  Constitutional: Negative.  Negative for fever, chills, weight loss, malaise/fatigue and diaphoresis.  HENT: Negative.  Negative for congestion, ear discharge, ear pain, hearing loss, nosebleeds, sore throat and tinnitus.   Eyes: Positive for blurred vision. Negative for double vision, photophobia, pain, discharge and redness.  Respiratory: Negative.  Negative for cough, hemoptysis, sputum production, shortness of breath, wheezing and stridor.   Cardiovascular: Negative.  Negative for chest pain, palpitations, orthopnea, claudication, leg swelling and PND.  Gastrointestinal: Negative.  Negative for heartburn, nausea, vomiting, abdominal pain, diarrhea, constipation, blood in stool and melena.  Genitourinary: Negative.  Negative for dysuria, urgency, frequency, hematuria and flank pain.  Musculoskeletal: Negative.  Negative for myalgias, back pain, joint pain, falls and neck pain.  Skin: Negative.  Negative for itching and rash.  Neurological: Negative for dizziness, tingling, tremors, sensory change, speech change, focal weakness, seizures, loss of consciousness, weakness and headaches.  Endo/Heme/Allergies: Negative for environmental allergies and polydipsia. Does not bruise/bleed easily.   Psychiatric/Behavioral: Negative.  Negative for depression, suicidal ideas, hallucinations, memory loss and substance abuse. The patient is not nervous/anxious and does not have insomnia.     Vital Signs: Blood pressure 167/77, pulse 73, temperature 98.1 F (36.7 C), temperature source Oral, resp. rate 18, height 6' 1" (1.854 m), weight 121.473 kg (267 lb 12.8 oz), SpO2 100 %.  Weight trends: Filed Weights   08/15/15 1921 08/15/15 1923 08/16/15 0031  Weight: 115.667 kg (255 lb) 115.667  kg (255 lb) 121.473 kg (267 lb 12.8 oz)    Physical Exam: General: NAD, obese white male  Head: Normocephalic, atraumatic. Moist oral mucosal membranes  Eyes: Anicteric, PERRL  Neck: Supple, trachea midline  Lungs:  Clear to auscultation  Heart: Regular rate and rhythm  Abdomen:  Soft, nontender, obese  Extremities: no peripheral edema.  Neurologic: Nonfocal, moving all four extremities  Skin: No lesions    Lab results: Basic Metabolic Panel:  Recent Labs Lab 08/15/15 1955 08/16/15 0047  NA 138 137  K 4.0 3.7  CL 105 107  CO2 25 24  GLUCOSE 121* 91  BUN 35* 35*  CREATININE 3.26* 3.25*  CALCIUM 8.6* 8.3*  PHOS  --  4.0    Liver Function Tests:  Recent Labs Lab 08/15/15 1955  AST 17  ALT 11*  ALKPHOS 77  BILITOT 0.5  PROT 7.9  ALBUMIN 3.7   No results for input(s): LIPASE, AMYLASE in the last 168 hours. No results for input(s): AMMONIA in the last 168 hours.  CBC:  Recent Labs Lab 08/15/15 1955 08/16/15 0047  WBC 12.5* 12.9*  NEUTROABS 9.1*  --   HGB 10.0* 9.8*  HCT 30.2* 29.9*  MCV 79.1* 78.7*  PLT 261 301    Cardiac Enzymes:  Recent Labs Lab 08/15/15 1955 08/16/15 0047  TROPONINI 0.04* 0.04*    BNP: Invalid input(s): POCBNP  CBG:  Recent Labs Lab 08/16/15 0731 08/16/15 1057  GLUCAP 87 127*    Microbiology: Results for orders placed or performed in visit on 11/09/14  Wound Aerobic Culture     Status: None   Collection Time: 11/28/14  8:55  AM  Result Value Ref Range Status   Micro Text Report   Final       SOURCE: LEFT LOWER LEG    ORGANISM 1                RARE Staphylococcus aureus   COMMENT                   -   GRAM STAIN                NO WHITE BLOOD CELLS   GRAM STAIN                RARE GRAM VARIABLE ROD   GRAM STAIN                RARE GRAM POSITIVE COCCI   ANTIBIOTIC                    ORG#1     CIPROFLOXACIN                 S         CLINDAMYCIN                   R         ERYTHROMYCIN                  R         GENTAMICIN                    S         LEVOFLOXACIN                  S         LINEZOLID  S         OXACILLIN                     S         TIGECYCLINE                   S         Cefoxitin Screen              NEGATIVE  Inducible Clindamycin ResistanPOSITIVE  Trimethoprim/Sulfamethoxazole S             Coagulation Studies:  Recent Labs  08/15/15 1955  LABPROT 13.9  INR 1.05    Urinalysis:  Recent Labs  08/15/15 2116  COLORURINE STRAW*  LABSPEC 1.013  PHURINE 5.0  GLUCOSEU 50*  HGBUR 1+*  BILIRUBINUR NEGATIVE  KETONESUR NEGATIVE  PROTEINUR 100*  NITRITE NEGATIVE  LEUKOCYTESUR NEGATIVE      Imaging: Ct Head Wo Contrast  08/15/2015   CLINICAL DATA:  Blurry vision for 2 days.  EXAM: CT HEAD WITHOUT CONTRAST  TECHNIQUE: Contiguous axial images were obtained from the base of the skull through the vertex without intravenous contrast.  COMPARISON:  None.  FINDINGS: Brain parenchyma and ventricular system are unremarkable. No mass effect, midline shift, or acute hemorrhage. Mastoid air cells are clear. Visualized paranasal sinuses are clear. Intact cranium.  IMPRESSION: Negative head CT   Electronically Signed   By: Marybelle Killings M.D.   On: 08/15/2015 20:00      Assessment & Plan: Mr. JAVARIS WIGINGTON is a 54 y.o. white male with diabetes mellitus type II noninsulin dependent, hypertension, hyperlipidemia, anemia, peripheral vascular disease, cholecystectomy,  right achilles tendon tear and neck surgery who was [facility] to [facility] on 08/15/2015 for Proteinuria [R80.9] Blurred vision, right eye [H53.8] Renal failure (ARF), acute on chronic ([place]) [N17.9, N18.9] Essential hypertension [I10] Anemia, unspecified anemia type [D64.9]   1. Chronic Kidney Disease stage IV: with proteinuria and hematuria: Proteinuria and hematuria could be due to hypertensive urgency or due to diabetic nephropathy. Patient's chronic kidney disease seems to be secondary to diabetic nephropathy and hypertensive nephrosclerosis.  Baseline creatinine of 2.8 eGFR of 24 on 12/06/2014.  - not currently on an ACE-I/ARB - Check renal ultrasound, SPEP/UPEP, uric acid, spot urine protein to creatinine ratio - Discussed kidney disease diagnosis with patient.   2. Hypertension: much better controlled.  - now on imdur, amlodipine and carvedilol - will start losartan 58m daily today and monitor renal function and electrolytes.   3. Anemia of chronic kidney disease: with history of iron deficiency. Currently on iron supplements. No Gastroenterology work up done. Hemoglobin 9.8.  - will need GI work up.   4. Secondary Hyperparathyroidism: phosphorus at goal at 4, calcium 8.3 - Check PTH     LOS:  KOLLURU, SARATH 10/7/20162:27 PM

## 2015-08-16 NOTE — Progress Notes (Signed)
*  PRELIMINARY RESULTS* Echocardiogram 2D Echocardiogram has been performed.  [name] 08/16/2015, 1:56 PM

## 2015-08-16 NOTE — Progress Notes (Addendum)
Pt states L foot is dressed once a week at wound care center.  Pt states there is a skin graft that was placed on Tuesday.  Pt refuses to have dressing removed at this time for nurse to assess.  Pt would rather have wound care nurse assess tomorrow or to continue to have it treated out patient when patient discharges from hospital.   Dressing is clean dry and intact at this time.   Lynnda Shields, RN

## 2015-08-17 LAB — HEPATITIS C ANTIBODY

## 2015-08-17 LAB — HEPATITIS B CORE ANTIBODY, IGM: Hep B C IgM: NEGATIVE

## 2015-08-17 LAB — HEPATITIS B SURFACE ANTIBODY,QUALITATIVE: HEP B S AB: NONREACTIVE

## 2015-08-17 LAB — ANA W/REFLEX: ANA: NEGATIVE

## 2015-08-17 LAB — PARATHYROID HORMONE, INTACT (NO CA): PTH: 72 pg/mL — AB (ref 15–65)

## 2015-08-17 LAB — HEPATITIS B SURFACE ANTIGEN: HEP B S AG: NEGATIVE

## 2015-08-19 LAB — PROTEIN ELECTROPHORESIS, SERUM
A/G RATIO SPE: 0.8 (ref 0.7–1.7)
ALPHA-2-GLOBULIN: 1.1 g/dL — AB (ref 0.4–1.0)
Albumin ELP: 3.4 g/dL (ref 2.9–4.4)
Alpha-1-Globulin: 0.2 g/dL (ref 0.0–0.4)
BETA GLOBULIN: 1.1 g/dL (ref 0.7–1.3)
GAMMA GLOBULIN: 1.6 g/dL (ref 0.4–1.8)
Globulin, Total: 4.1 g/dL — ABNORMAL HIGH (ref 2.2–3.9)
Total Protein ELP: 7.5 g/dL (ref 6.0–8.5)

## 2015-08-20 ENCOUNTER — Encounter: Payer: BLUE CROSS/BLUE SHIELD | Admitting: Surgery

## 2015-08-20 DIAGNOSIS — L97222 Non-pressure chronic ulcer of left calf with fat layer exposed: Secondary | ICD-10-CM | POA: Diagnosis not present

## 2015-08-20 LAB — GLUCOSE, CAPILLARY: Glucose-Capillary: 77 mg/dL (ref 65–99)

## 2015-08-21 NOTE — Progress Notes (Signed)
JAS, BETTEN ([id number]) Visit Report for 08/20/2015 Arrival Information Details Patient Name: [name], [name]. Date of Service: 08/20/2015 8:00 AM Medical Record Number: [id number] Patient Account Number: [id number] Date of Birth/Sex: 1960-12-09 (54 y.o. Male) Treating RN: Montey Hora Primary Care Physician: Frazier Richards Other Clinician: Referring Physician: Frazier Richards Treating Physician/Extender: Frann Rider in Treatment: 10 Visit Information History Since Last Visit Added or deleted any medications: No Patient Arrived: Ambulatory Any new allergies or adverse reactions: No Arrival Time: 08:13 Had a fall or experienced change in No Accompanied By: self activities of daily living that may affect Transfer Assistance: None risk of falls: Patient Identification Verified: Yes Signs or symptoms of abuse/neglect since last No Secondary Verification Process Yes visito Completed: Hospitalized since last visit: No Patient Has Alerts: Yes Pain Present Now: No Electronic Signature(s) Signed: 08/20/2015 5:00:37 PM By: Montey Hora Entered By: Montey Hora on 08/20/2015 08:13:40 Arizmendi, Elisah J. ([id number]) -------------------------------------------------------------------------------- Encounter Discharge Information Details Patient Name: [name], [name] Date of Service: 08/20/2015 8:00 AM Medical Record Number: [id number] Patient Account Number: [id number] Date of Birth/Sex: [birth year] (54 y.o. Male) Treating RN: Montey Hora Primary Care Physician: Frazier Richards Other Clinician: Referring Physician: Frazier Richards Treating Physician/Extender: Frann Rider in Treatment: 10 Encounter Discharge Information Items Discharge Pain Level: 0 Discharge Condition: Stable Ambulatory Status: Ambulatory Discharge Destination: Home Transportation: Private Auto Accompanied By: self Schedule Follow-up Appointment: Yes Medication  Reconciliation completed No and provided to Patient/Care Markesia Crilly: Patient Clinical Summary of Care: Declined Electronic Signature(s) Signed: 08/20/2015 8:47:35 AM By: Ruthine Dose Entered By: Ruthine Dose on 08/20/2015 08:47:35 Bencomo, Deivi J. ([id number]) -------------------------------------------------------------------------------- Lower Extremity Assessment Details Patient Name: [name], [name] Date of Service: 08/20/2015 8:00 AM Medical Record Number: [id number] Patient Account Number: [id number] Date of Birth/Sex: June 20, 1961 (54 y.o. Male) Treating RN: Montey Hora Primary Care Physician: Frazier Richards Other Clinician: Referring Physician: Frazier Richards Treating Physician/Extender: Frann Rider in Treatment: 10 Edema Assessment Assessed: Shirlyn Goltz: No] Patrice Paradise: No] Edema: [Left: N] [Right: o] Calf Left: Right: Point of Measurement: 34 cm From Medial Instep 39.3 cm cm Ankle Left: Right: Point of Measurement: 10 cm From Medial Instep 26.7 cm cm Vascular Assessment Pulses: Posterior Tibial Dorsalis Pedis Palpable: [Left:Yes] Extremity colors, hair growth, and conditions: Extremity Color: [Left:Normal] Hair Growth on Extremity: [Left:Yes] Temperature of Extremity: [Left:Warm] Capillary Refill: [Left:< 3 seconds] Toe Nail Assessment Left: Right: Thick: Yes Discolored: No Deformed: No Improper Length and Hygiene: No Electronic Signature(s) Signed: 08/20/2015 5:00:37 PM By: Montey Hora Entered By: Montey Hora on 08/20/2015 08:20:14 Matheson, Durell J. ([id number]) -------------------------------------------------------------------------------- Multi Wound Chart Details Patient Name: [name], [name] Date of Service: 08/20/2015 8:00 AM Medical Record Number: [id number] Patient Account Number: [id number] Date of Birth/Sex: 29-Apr-1961 (54 y.o. Male) Treating RN: Montey Hora Primary Care Physician: Frazier Richards Other  Clinician: Referring Physician: Frazier Richards Treating Physician/Extender: Frann Rider in Treatment: 10 Vital Signs Height(in): 74 Pulse(bpm): 75 Weight(lbs): 260 Blood Pressure 158/74 (mmHg): Body Mass Index(BMI): 33 Temperature(F): 98.3 Respiratory Rate 18 (breaths/min): Wound Assessments Treatment Notes Electronic Signature(s) Signed: 08/20/2015 5:00:37 PM By: Montey Hora Entered By: Montey Hora on 08/20/2015 08:20:57 [name], [name] JMarland Kitchen ([id number]) -------------------------------------------------------------------------------- Multi-Disciplinary Care Plan Details Patient Name: [name], [name] Date of Service: 08/20/2015 8:00 AM Medical Record Number: [id number] Patient Account Number: [id number] Date of Birth/Sex: 1961-03-31 (54 y.o. Male) Treating RN: Montey Hora Primary Care Physician: Frazier Richards Other Clinician: Referring Physician: Frazier Richards Treating Physician/Extender: Frann Rider in Treatment: 10 Active Inactive Venous Leg Ulcer Nursing  Diagnoses: Actual venous Insuffiency (use after diagnosis is confirmed) Goals: Patient will maintain optimal edema control Date Initiated: 06/11/2015 Goal Status: Active Patient/caregiver will verbalize understanding of disease process and disease management Date Initiated: 06/11/2015 Goal Status: Active Interventions: Assess peripheral edema status every visit. Compression as ordered Notes: Wound/Skin Impairment Nursing Diagnoses: Impaired tissue integrity Goals: Ulcer/skin breakdown will have a volume reduction of 30% by week 4 Date Initiated: 06/11/2015 Goal Status: Active Ulcer/skin breakdown will have a volume reduction of 50% by week 8 Date Initiated: 06/11/2015 Goal Status: Active Ulcer/skin breakdown will have a volume reduction of 80% by week 12 Date Initiated: 06/11/2015 Goal Status: Active Ulcer/skin breakdown will heal within 14 weeks Date Initiated:  06/11/2015 [name], [name] ([id number]) Goal Status: Active Interventions: Assess patient/caregiver ability to perform ulcer/skin care regimen upon admission and as needed Assess ulceration(s) every visit Notes: Electronic Signature(s) Signed: 08/20/2015 5:00:37 PM By: Montey Hora Entered By: Montey Hora on 08/20/2015 08:20:51 [name], [name] ([id number]) -------------------------------------------------------------------------------- Patient/Caregiver Education Details Patient Name: [name]. Date of Service: 08/20/2015 8:00 AM Medical Record Number: [id number] Patient Account Number: [id number] Date of Birth/Gender: Dec 27, 1960 (54 y.o. Male) Treating RN: Montey Hora Primary Care Physician: Frazier Richards Other Clinician: Referring Physician: Frazier Richards Treating Physician/Extender: Frann Rider in Treatment: 10 Education Assessment Education Provided To: Patient Education Topics Provided Venous: Handouts: Other: keep wrap from getting wet Methods: Explain/Verbal Responses: State content correctly Electronic Signature(s) Signed: 08/20/2015 5:00:37 PM By: Montey Hora Entered By: Montey Hora on 08/20/2015 08:47:12 [name], [name] ([id number]) -------------------------------------------------------------------------------- Vitals Details Patient Name: [name], [name] Date of Service: 08/20/2015 8:00 AM Medical Record Number: [id number] Patient Account Number: [id number] Date of Birth/Sex: 1961-07-20 (54 y.o. Male) Treating RN: Montey Hora Primary Care Physician: Frazier Richards Other Clinician: Referring Physician: Frazier Richards Treating Physician/Extender: Frann Rider in Treatment: 10 Vital Signs Time Taken: 08:14 Temperature (F): 98.3 Height (in): 74 Pulse (bpm): 75 Weight (lbs): 260 Respiratory Rate (breaths/min): 18 Body Mass Index (BMI): 33.4 Blood Pressure (mmHg): 158/74 Reference Range: 80 - 120  mg / dl Electronic Signature(s) Signed: 08/20/2015 5:00:37 PM By: Montey Hora Entered By: Montey Hora on 08/20/2015 08:15:39
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
## 2015-08-27 ENCOUNTER — Encounter: Payer: BLUE CROSS/BLUE SHIELD | Admitting: Surgery

## 2015-08-27 DIAGNOSIS — L97222 Non-pressure chronic ulcer of left calf with fat layer exposed: Secondary | ICD-10-CM | POA: Diagnosis not present
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
## 2015-08-28 NOTE — Progress Notes (Signed)
[name], [name] ([id number]) Visit Report for 08/27/2015 Arrival Information Details Patient Name: [name], [name]. Date of Service: 08/27/2015 8:00 AM Medical Record Number: [id number] Patient Account Number: [id number] Date of Birth/Sex: 03/01/1961 (54 y.o. Male) Treating RN: Montey Hora Primary Care Physician: Frazier Richards Other Clinician: Referring Physician: Frazier Richards Treating Physician/Extender: Frann Rider in Treatment: 11 Visit Information History Since Last Visit Added or deleted any medications: No Patient Arrived: Ambulatory Any new allergies or adverse reactions: No Arrival Time: 08:15 Had a fall or experienced change in No Accompanied By: self activities of daily living that may affect Transfer Assistance: None risk of falls: Patient Identification Verified: Yes Signs or symptoms of abuse/neglect since last No Secondary Verification Process Yes visito Completed: Hospitalized since last visit: No Patient Has Alerts: Yes Pain Present Now: No Electronic Signature(s) Signed: 08/27/2015 5:20:05 PM By: Montey Hora Entered By: Montey Hora on 08/27/2015 08:15:27 [name], [name] ([id number]) -------------------------------------------------------------------------------- Encounter Discharge Information Details Patient Name: [name], [name]. Date of Service: 08/27/2015 8:00 AM Medical Record Number: [id number] Patient Account Number: [id number] Date of Birth/Sex: 10-09-1961 (54 y.o. Male) Treating RN: Montey Hora Primary Care Physician: Frazier Richards Other Clinician: Referring Physician: Frazier Richards Treating Physician/Extender: Frann Rider in Treatment: 11 Encounter Discharge Information Items Discharge Pain Level: 0 Discharge Condition: Stable Ambulatory Status: Ambulatory Discharge Destination: Home Private Transportation: Auto Accompanied By: self Schedule Follow-up Appointment: Yes Medication  Reconciliation completed and No provided to Patient/Care Wednesday Ericsson: Clinical Summary of Care: Electronic Signature(s) Signed: 08/27/2015 5:20:05 PM By: Montey Hora Entered By: Montey Hora on 08/27/2015 08:55:45 [name], [name] ([id number]) -------------------------------------------------------------------------------- Lower Extremity Assessment Details Patient Name: [name], [name]. Date of Service: 08/27/2015 8:00 AM Medical Record Number: [id number] Patient Account Number: [id number] Date of Birth/Sex: 12/01/1960 (54 y.o. Male) Treating RN: Montey Hora Primary Care Physician: Frazier Richards Other Clinician: Referring Physician: Frazier Richards Treating Physician/Extender: Frann Rider in Treatment: 11 Edema Assessment Assessed: Shirlyn Goltz: No] Patrice Paradise: No] Edema: [Left: N] [Right: o] Calf Left: Right: Point of Measurement: 34 cm From Medial Instep 39.3 cm cm Ankle Left: Right: Point of Measurement: 10 cm From Medial Instep 25.9 cm cm Vascular Assessment Pulses: Posterior Tibial Dorsalis Pedis Palpable: [Left:Yes] Extremity colors, hair growth, and conditions: Extremity Color: [Left:Normal] Hair Growth on Extremity: [Left:Yes] Temperature of Extremity: [Left:Warm] Capillary Refill: [Left:< 3 seconds] Electronic Signature(s) Signed: 08/27/2015 5:20:05 PM By: Montey Hora Entered By: Montey Hora on 08/27/2015 08:22:19 [name], [name] ([id number]) -------------------------------------------------------------------------------- Multi Wound Chart Details Patient Name: [name], [name]. Date of Service: 08/27/2015 8:00 AM Medical Record Number: [id number] Patient Account Number: [id number] Date of Birth/Sex: 03-18-1961 (54 y.o. 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] [N/A:N/A] Comorbid History: [3:Hypertension, Type II Diabetes, Osteoarthritis, Neuropathy] [N/A:N/A] Date Acquired: [3:04/11/2015] [N/A:N/A] Weeks of Treatment: [3:11] [N/A:N/A] Wound Status: [3:Open] [N/A:N/A] Measurements L x W x D 2.2x2.8x0.1 [N/A:N/A] (cm) Area (cm) : [3:4.838] [N/A:N/A] Volume (cm) : [3:0.484] [N/A:N/A] % Reduction in Area: [3:66.20%] [N/A:N/A] % Reduction in Volume: 88.70% [N/A:N/A] Classification: [3:Grade 2] [N/A:N/A] Exudate Amount: [3:Medium] [N/A:N/A] Exudate Type: [3:Serosanguineous] [N/A:N/A] Exudate Color: [3:red, brown] [N/A:N/A] Wound  Margin: [3:Flat and Intact] [N/A:N/A] Granulation Amount: [3:Large (67-100%)] [N/A:N/A] Granulation Quality: [3:Red, Hyper-granulation] [N/A:N/A] Necrotic Amount: [3:None Present (0%)] [N/A:N/A] Exposed Structures: [3:Fascia: No Fat: No Tendon: No Muscle: No] [N/A:N/A] Joint: No Bone: No Limited to Skin Breakdown Epithelialization: Medium (34-66%) N/A N/A Periwound Skin Texture: Edema: No N/A N/A Excoriation: No Induration: No Callus: No Crepitus: No Fluctuance: No Friable: No Rash: No Scarring: No Periwound Skin Moist: Yes N/A N/A Moisture: Maceration: No Dry/Scaly: No Periwound Skin Color: Atrophie Blanche: No N/A N/A Cyanosis: No Ecchymosis: No Erythema: No Hemosiderin Staining: No Mottled: No Pallor: No Rubor: No Temperature: No Abnormality N/A N/A Tenderness on No N/A N/A Palpation: Wound Preparation: Ulcer Cleansing: Other: N/A N/A soap and water Topical Anesthetic Applied: Other:  lidocaine 4% Treatment Notes Electronic Signature(s) Signed: 08/27/2015 5:20:05 PM By: Montey Hora Entered By: Montey Hora on 08/27/2015 08:30:43 [name], [name]. ([id number]) -------------------------------------------------------------------------------- Multi-Disciplinary Care Plan Details Patient Name: [name], Maico [name]. Date of Service: 08/27/2015 8:00 AM Medical Record Number: [id number] Patient Account Number: [id number] Date of Birth/Sex: 1961/03/15 (54 y.o. Male) Treating RN: Montey Hora Primary Care Physician: Frazier Richards Other Clinician: Referring Physician: Frazier Richards Treating Physician/Extender: Frann Rider in Treatment: 11 Active Inactive Venous Leg Ulcer Nursing Diagnoses: Actual venous Insuffiency (use after diagnosis is confirmed) Goals: Patient will maintain optimal edema control Date Initiated: 06/11/2015 Goal Status: Active Patient/caregiver will verbalize understanding of disease process and disease management Date Initiated: 06/11/2015 Goal Status: Active Interventions: Assess peripheral edema status every visit. Compression as ordered Notes: Wound/Skin Impairment Nursing Diagnoses: Impaired tissue integrity Goals: Ulcer/skin breakdown will have a volume reduction of 30% by week 4 Date Initiated: 06/11/2015 Goal Status: Active Ulcer/skin breakdown will have a volume reduction of 50% by week 8 Date Initiated: 06/11/2015 Goal Status: Active Ulcer/skin breakdown will have a volume reduction of 80% by week 12 Date Initiated: 06/11/2015 Goal Status: Active Ulcer/skin breakdown will heal within 14 weeks Date Initiated: 06/11/2015 [name], [name] ([id number]) Goal Status: Active Interventions: Assess patient/caregiver ability to perform ulcer/skin care regimen upon admission and as needed Assess ulceration(s) every visit Notes: Electronic Signature(s) Signed: 08/27/2015 5:20:05 PM By: Montey Hora Entered By: Montey Hora on  08/27/2015 08:30:36 [name], [name] ([id number]) -------------------------------------------------------------------------------- Patient/Caregiver Education Details Patient Name: [name] Marin. Date of Service: 08/27/2015 8:00 AM Medical Record Number: [id number] Patient Account Number: [id number] Date of Birth/Gender: 1961/02/05 (54 y.o. Male) Treating RN: Montey Hora Primary Care Physician: Frazier Richards Other Clinician: Referring Physician: Frazier Richards Treating Physician/Extender: Frann Rider in Treatment: 11 Education Assessment Education Provided To: Patient Education Topics Provided Wound/Skin Impairment: Handouts: Other: apligraf Methods: Explain/Verbal Responses: State content correctly Electronic Signature(s) Signed: 08/27/2015 5:20:05 PM By: Montey Hora Entered By: Montey Hora on 08/27/2015 08:40:05 [name], [name]. ([id number]) -------------------------------------------------------------------------------- Wound Assessment Details Patient Name: [name], [name]. Date of Service: 08/27/2015 8:00 AM Medical Record Number: [id number] Patient Account Number: [id number] Date of Birth/Sex: August 03, 1961 (54 y.o. Male) Treating RN: Montey Hora Primary Care Physician: Frazier Richards Other Clinician: Referring Physician: Frazier Richards Treating Physician/Extender: Frann Rider in Treatment: 11 Wound Status Wound Number: 3 Primary Diabetic Wound/Ulcer of the Lower Etiology: Extremity Wound Location: Left Lower Leg - Medial Secondary Venous Leg Ulcer Wounding Event: Gradually Appeared Etiology: Date Acquired: 04/11/2015 Wound Status: Open Weeks Of Treatment: 11 Comorbid Hypertension, Type II Diabetes, Clustered Wound: No History: Osteoarthritis, Neuropathy Photos Photo Uploaded By: Montey Hora on 08/27/2015 12:04:07 Wound Measurements Length: (cm) 2.2 Width: (cm) 2.8 Depth: (cm) 0.1 Area: (cm) 4.838 Volume:  (  cm) 0.484 % Reduction in Area: 66.2% % Reduction in Volume: 88.7% Epithelialization: Medium (34-66%) Tunneling: No Undermining: No Wound Description Classification: Grade 2 Wound Margin: Flat and Intact Exudate Amount: Medium Exudate Type: Serosanguineous Exudate Color: red, brown Wound Bed Granulation Amount: Large (67-100%) Exposed Structure Granulation Quality: Red, Hyper-granulation Fascia Exposed: No Necrotic Amount: None Present (0%) Fat Layer Exposed: No Tendon Exposed: No [name], [name]. ([id number]) Muscle Exposed: No Joint Exposed: No Bone Exposed: No Limited to Skin Breakdown Periwound Skin Texture Texture Color No Abnormalities Noted: No No Abnormalities Noted: No Callus: No Atrophie Blanche: No Crepitus: No Cyanosis: No Excoriation: No Ecchymosis: No Fluctuance: No Erythema: No Friable: No Hemosiderin Staining: No Induration: No Mottled: No Localized Edema: No Pallor: No Rash: No Rubor: No Scarring: No Temperature / Pain Moisture Temperature: No Abnormality No Abnormalities Noted: No Dry / Scaly: No Maceration: No Moist: Yes Wound Preparation Ulcer Cleansing: Other: soap and water, Topical Anesthetic Applied: Other: lidocaine 4%, Treatment Notes Wound #3 (Left, Medial Lower Leg) 1. Cleansed with: Cleanse wound with antibacterial soap and water 2. Anesthetic Topical Lidocaine 4% cream to wound bed prior to debridement 4. Dressing Applied: Other dressing (specify in notes) 5. Secondary Dressing Applied ABD Pad Dry Gauze 7. Secured with Rolena Infante to Left Lower Extremity Notes apligraf applied by Dr Con Memos today in clinic Electronic Signature(s) Signed: 08/27/2015 5:20:05 PM By: Montey Hora Entered By: Montey Hora on 08/27/2015 08:30:27 [name], [name]. ([id number]) [name], [name]. ([id number]) -------------------------------------------------------------------------------- Vitals Details Patient Name: [name], Camdin  [name]. Date of Service: 08/27/2015 8:00 AM Medical Record Number: [id number] Patient Account Number: [id number] Date of Birth/Sex: 1960/12/02 (54 y.o. Male) Treating RN: Montey Hora Primary Care Physician: Frazier Richards Other Clinician: Referring Physician: Frazier Richards Treating Physician/Extender: Frann Rider in Treatment: 11 Vital Signs Time Taken: 08:16 Pulse (bpm): 73 Height (in): 74 Respiratory Rate (breaths/min): 18 Weight (lbs): 260 Blood Pressure (mmHg): 150/70 Body Mass Index (BMI): 33.4 Reference Range: 80 - 120 mg / dl Electronic Signature(s) Signed: 08/27/2015 5:20:05 PM By: Montey Hora Entered By: Montey Hora on 08/27/2015 08:16:35

## 2015-08-30 ENCOUNTER — Other Ambulatory Visit: Payer: Self-pay | Admitting: Internal Medicine

## 2015-09-03 ENCOUNTER — Encounter: Payer: BLUE CROSS/BLUE SHIELD | Admitting: Surgery

## 2015-09-03 DIAGNOSIS — L97222 Non-pressure chronic ulcer of left calf with fat layer exposed: Secondary | ICD-10-CM | POA: Diagnosis not present
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
## 2015-09-04 NOTE — Progress Notes (Signed)
[name], [name] ([id number]) Visit Report for 09/03/2015 Arrival Information Details Patient Name: [name], [name]. Date of Service: 09/03/2015 8:00 AM Medical Record Number: [id number] Patient Account Number: [id number] Date of Birth/Sex: 05-05-1961 (54 y.o. Male) Treating RN: Montey Hora Primary Care Physician: Frazier Richards Other Clinician: Referring Physician: Frazier Richards Treating Physician/Extender: Frann Rider in Treatment: 12 Visit Information History Since Last Visit Added or deleted any medications: No Patient Arrived: Ambulatory Any new allergies or adverse reactions: No Arrival Time: 08:08 Had a fall or experienced change in No Accompanied By: self activities of daily living that may affect Transfer Assistance: None risk of falls: Patient Identification Verified: Yes Signs or symptoms of abuse/neglect since last No Secondary Verification Process Yes visito Completed: Hospitalized since last visit: No Patient Has Alerts: Yes Pain Present Now: No Electronic Signature(s) Signed: 09/03/2015 5:44:39 PM By: Montey Hora Entered By: Montey Hora on 09/03/2015 08:08:37 Dicenso, Gerritt J. ([id number]) -------------------------------------------------------------------------------- Encounter Discharge Information Details Patient Name: [name], [name] Date of Service: 09/03/2015 8:00 AM Medical Record Number: [id number] Patient Account Number: [id number] Date of Birth/Sex: [birth year] (54 y.o. Male) Treating RN: Montey Hora Primary Care Physician: Frazier Richards Other Clinician: Referring Physician: Frazier Richards Treating Physician/Extender: Frann Rider in Treatment: 12 Encounter Discharge Information Items Discharge Pain Level: 0 Discharge Condition: Stable Ambulatory Status: Ambulatory Discharge Destination: Home Transportation: Private Auto Accompanied By: self Schedule Follow-up Appointment: Yes Medication  Reconciliation completed No and provided to Patient/Care Katherin Ramey: Patient Clinical Summary of Care: Declined Electronic Signature(s) Signed: 09/03/2015 8:36:07 AM By: Ruthine Dose Entered By: Ruthine Dose on 09/03/2015 08:36:07 Pringle, Lue J. ([id number]) -------------------------------------------------------------------------------- Lower Extremity Assessment Details Patient Name: [name], [name] Date of Service: 09/03/2015 8:00 AM Medical Record Number: [id number] Patient Account Number: [id number] Date of Birth/Sex: 1961/08/03 (54 y.o. Male) Treating RN: Montey Hora Primary Care Physician: Frazier Richards Other Clinician: Referring Physician: Frazier Richards Treating Physician/Extender: Frann Rider in Treatment: 12 Edema Assessment Assessed: Shirlyn Goltz: No] Patrice Paradise: No] E[Left: dema] [Right: :] Calf Left: Right: Point of Measurement: 34 cm From Medial Instep 39.2 cm cm Ankle Left: Right: Point of Measurement: 10 cm From Medial Instep 25 cm cm Vascular Assessment Pulses: Posterior Tibial Dorsalis Pedis Palpable: [Left:Yes] Extremity colors, hair growth, and conditions: Extremity Color: [Left:Normal] Hair Growth on Extremity: [Left:Yes] Temperature of Extremity: [Left:Warm] Capillary Refill: [Left:< 3 seconds] Electronic Signature(s) Signed: 09/03/2015 5:44:39 PM By: Montey Hora Entered By: Montey Hora on 09/03/2015 08:16:05 Longman, Chaos J. ([id number]) -------------------------------------------------------------------------------- Multi Wound Chart Details Patient Name: [name], [name] Date of Service: 09/03/2015 8:00 AM Medical Record Number: [id number] Patient Account Number: [id number] Date of Birth/Sex: Jan 08, 1961 (54 y.o. Male) Treating RN: Montey Hora Primary Care Physician: Frazier Richards Other Clinician: Referring Physician: Frazier Richards Treating Physician/Extender: Frann Rider in Treatment: 12 Vital  Signs Height(in): 74 Pulse(bpm): 77 Weight(lbs): 260 Blood Pressure 163/69 (mmHg): Body Mass Index(BMI): 33 Temperature(F): 98.4 Respiratory Rate 18 (breaths/min): Wound Assessments Treatment Notes Electronic Signature(s) Signed: 09/03/2015 5:44:39 PM By: Montey Hora Entered By: Montey Hora on 09/03/2015 08:16:23 Matt, Thayden Lenna Sciara ([id number]) -------------------------------------------------------------------------------- Multi-Disciplinary Care Plan Details Patient Name: [name], [name] Date of Service: 09/03/2015 8:00 AM Medical Record Number: [id number] Patient Account Number: [id number] Date of Birth/Sex: 10-29-1961 (54 y.o. Male) Treating RN: Montey Hora Primary Care Physician: Frazier Richards Other Clinician: Referring Physician: Frazier Richards Treating Physician/Extender: Frann Rider in Treatment: 12 Active Inactive Venous Leg Ulcer Nursing Diagnoses: Actual venous Insuffiency (use after diagnosis is confirmed) Goals: Patient will maintain optimal edema control Date  Initiated: 06/11/2015 Goal Status: Active Patient/caregiver will verbalize understanding of disease process and disease management Date Initiated: 06/11/2015 Goal Status: Active Interventions: Assess peripheral edema status every visit. Compression as ordered Notes: Wound/Skin Impairment Nursing Diagnoses: Impaired tissue integrity Goals: Ulcer/skin breakdown will have a volume reduction of 30% by week 4 Date Initiated: 06/11/2015 Goal Status: Active Ulcer/skin breakdown will have a volume reduction of 50% by week 8 Date Initiated: 06/11/2015 Goal Status: Active Ulcer/skin breakdown will have a volume reduction of 80% by week 12 Date Initiated: 06/11/2015 Goal Status: Active Ulcer/skin breakdown will heal within 14 weeks Date Initiated: 06/11/2015 [name], [name] ([id number]) Goal Status: Active Interventions: Assess patient/caregiver ability to perform ulcer/skin care  regimen upon admission and as needed Assess ulceration(s) every visit Notes: Electronic Signature(s) Signed: 09/03/2015 5:44:39 PM By: Montey Hora Entered By: Montey Hora on 09/03/2015 08:16:17 [name], [name] ([id number]) -------------------------------------------------------------------------------- Patient/Caregiver Education Details Patient Name: [name] Marin. Date of Service: 09/03/2015 8:00 AM Medical Record Number: [id number] Patient Account Number: [id number] Date of Birth/Gender: [birth year] (54 y.o. Male) Treating RN: Montey Hora Primary Care Physician: Frazier Richards Other Clinician: Referring Physician: Frazier Richards Treating Physician/Extender: Frann Rider in Treatment: 12 Education Assessment Education Provided To: Patient Education Topics Provided Venous: Handouts: Other: keep wrap on all week Methods: Explain/Verbal Responses: State content correctly Electronic Signature(s) Signed: 09/03/2015 5:44:39 PM By: Montey Hora Entered By: Montey Hora on 09/03/2015 08:35:16 [name], [name] ([id number]) -------------------------------------------------------------------------------- Wound Assessment Details Patient Name: [name], [name] Date of Service: 09/03/2015 8:00 AM Medical Record Number: [id number] Patient Account Number: [id number] Date of Birth/Sex: [birth year] (54 y.o. Male) Treating RN: Montey Hora Primary Care Physician: Frazier Richards Other Clinician: Referring Physician: Frazier Richards Treating Physician/Extender: Frann Rider in Treatment: 12 Wound Status Wound Number: 3 Primary Diabetic Wound/Ulcer of the Lower Etiology: Extremity Wound Location: Left Lower Leg - Medial Secondary Venous Leg Ulcer Wounding Event: Gradually Appeared Etiology: Date Acquired: 04/11/2015 Wound Status: Open Weeks Of Treatment: 12 Comorbid Hypertension, Type II Diabetes, Clustered Wound: No History: Osteoarthritis,  Neuropathy Photos Photo Uploaded By: Montey Hora on 09/03/2015 16:55:46 Wound Measurements Length: (cm) 2.2 Width: (cm) 2.8 Depth: (cm) 0.1 Area: (cm) 4.838 Volume: (cm) 0.484 % Reduction in Area: 66.2% % Reduction in Volume: 88.7% Epithelialization: Medium (34-66%) Wound Description Classification: Grade 2 Wound Margin: Flat and Intact Exudate Amount: Medium Exudate Type: Serosanguineous Exudate Color: red, brown Wound Bed Granulation Amount: Large (67-100%) Exposed Structure Granulation Quality: Red, Hyper-granulation Fascia Exposed: No Necrotic Amount: None Present (0%) Fat Layer Exposed: No Tendon Exposed: No [name], [name] ([id number]) Muscle Exposed: No Joint Exposed: No Bone Exposed: No Limited to Skin Breakdown Periwound Skin Texture Texture Color No Abnormalities Noted: No No Abnormalities Noted: No Callus: No Atrophie Blanche: No Crepitus: No Cyanosis: No Excoriation: No Ecchymosis: No Fluctuance: No Erythema: No Friable: No Hemosiderin Staining: No Induration: No Mottled: No Localized Edema: No Pallor: No Rash: No Rubor: No Scarring: No Temperature / Pain Moisture Temperature: No Abnormality No Abnormalities Noted: No Dry / Scaly: No Maceration: No Moist: Yes Wound Preparation Ulcer Cleansing: Other: soap and water, Topical Anesthetic Applied: None Treatment Notes Wound #3 (Left, Medial Lower Leg) 1. Cleansed with: Cleanse wound with antibacterial soap and water 5. Secondary Dressing Applied Dry Gauze 7. Secured with Rolena Infante to Left Lower Extremity Electronic Signature(s) Signed: 09/03/2015 5:44:39 PM By: Montey Hora Entered By: Montey Hora on 09/03/2015 08:20:08 [name], [name] Lenna Sciara ([id number]) -------------------------------------------------------------------------------- Vitals Details Patient Name: [name], Nikan J. Date of Service: 09/03/2015  8:00 AM Medical Record Number: [id number] Patient Account Number:  [id number] Date of Birth/Sex: [birth year] (54 y.o. Male) Treating RN: Montey Hora Primary Care Physician: Frazier Richards Other Clinician: Referring Physician: Frazier Richards Treating Physician/Extender: Frann Rider in Treatment: 12 Vital Signs Time Taken: 08:12 Temperature (F): 98.4 Height (in): 74 Pulse (bpm): 77 Weight (lbs): 260 Respiratory Rate (breaths/min): 18 Body Mass Index (BMI): 33.4 Blood Pressure (mmHg): 163/69 Reference Range: 80 - 120 mg / dl Electronic Signature(s) Signed: 09/03/2015 5:44:39 PM By: Montey Hora Entered By: Montey Hora on 09/03/2015 08:12:38

## 2015-09-10 ENCOUNTER — Encounter: Payer: BLUE CROSS/BLUE SHIELD | Attending: Surgery | Admitting: Surgery

## 2015-09-10 DIAGNOSIS — I129 Hypertensive chronic kidney disease with stage 1 through stage 4 chronic kidney disease, or unspecified chronic kidney disease: Secondary | ICD-10-CM | POA: Insufficient documentation

## 2015-09-10 DIAGNOSIS — I83022 Varicose veins of left lower extremity with ulcer of calf: Secondary | ICD-10-CM | POA: Diagnosis not present

## 2015-09-10 DIAGNOSIS — N184 Chronic kidney disease, stage 4 (severe): Secondary | ICD-10-CM | POA: Diagnosis not present

## 2015-09-10 DIAGNOSIS — L97222 Non-pressure chronic ulcer of left calf with fat layer exposed: Secondary | ICD-10-CM | POA: Diagnosis not present

## 2015-09-10 DIAGNOSIS — I872 Venous insufficiency (chronic) (peripheral): Secondary | ICD-10-CM | POA: Diagnosis not present

## 2015-09-10 DIAGNOSIS — E11622 Type 2 diabetes mellitus with other skin ulcer: Secondary | ICD-10-CM | POA: Insufficient documentation

## 2015-09-10 DIAGNOSIS — E1122 Type 2 diabetes mellitus with diabetic chronic kidney disease: Secondary | ICD-10-CM | POA: Diagnosis not present

## 2015-09-10 DIAGNOSIS — E1140 Type 2 diabetes mellitus with diabetic neuropathy, unspecified: Secondary | ICD-10-CM | POA: Insufficient documentation
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
## 2015-09-11 NOTE — Progress Notes (Signed)
JESSEY, STEHLIN ([id number]) Visit Report for 09/10/2015 Arrival Information Details Patient Name: [name], [name]. Date of Service: 09/10/2015 8:00 AM Medical Record Number: [id number] Patient Account Number: [id number] Date of Birth/Sex: May 01, 1961 (54 y.o. Male) Treating RN: Montey Hora Primary Care Physician: Frazier Richards Other Clinician: Referring Physician: Frazier Richards Treating Physician/Extender: Frann Rider in Treatment: 13 Visit Information History Since Last Visit Added or deleted any medications: No Patient Arrived: Ambulatory Any new allergies or adverse reactions: No Arrival Time: 08:09 Had a fall or experienced change in No Accompanied By: self activities of daily living that may affect Transfer Assistance: None risk of falls: Patient Identification Verified: Yes Signs or symptoms of abuse/neglect since last No Secondary Verification Process Yes visito Completed: Hospitalized since last visit: No Patient Has Alerts: Yes Pain Present Now: No Electronic Signature(s) Signed: 09/10/2015 5:15:29 PM By: Montey Hora Entered By: Montey Hora on 09/10/2015 08:10:12 Jaros, Zai J. ([id number]) -------------------------------------------------------------------------------- Encounter Discharge Information Details Patient Name: [name], [name] Date of Service: 09/10/2015 8:00 AM Medical Record Number: [id number] Patient Account Number: [id number] Date of Birth/Sex: Aug 03, 1961 (54 y.o. Male) Treating RN: Montey Hora Primary Care Physician: Frazier Richards Other Clinician: Referring Physician: Frazier Richards Treating Physician/Extender: Frann Rider in Treatment: 70 Encounter Discharge Information Items Discharge Pain Level: 0 Discharge Condition: Stable Ambulatory Status: Ambulatory Discharge Destination: Home Transportation: Private Auto Accompanied By: self Schedule Follow-up Appointment: Yes Medication Reconciliation  completed No and provided to Patient/Care Alaiza Yau: Patient Clinical Summary of Care: Declined Electronic Signature(s) Signed: 09/10/2015 8:46:10 AM By: Ruthine Dose Entered By: Ruthine Dose on 09/10/2015 08:46:10 [name], [name] ([id number]) -------------------------------------------------------------------------------- Lower Extremity Assessment Details Patient Name: [name], [name] Date of Service: 09/10/2015 8:00 AM Medical Record Number: [id number] Patient Account Number: [id number] Date of Birth/Sex: 05/30/1961 (54 y.o. Male) Treating RN: Montey Hora Primary Care Physician: Frazier Richards Other Clinician: Referring Physician: Frazier Richards Treating Physician/Extender: Frann Rider in Treatment: 13 Edema Assessment Assessed: [name]: No] Patrice Paradise: No] Edema: [Left: Ye] [Right: s] Calf Left: Right: Point of Measurement: 34 cm From Medial Instep 38.5 cm cm Ankle Left: Right: Point of Measurement: 10 cm From Medial Instep 26.3 cm cm Vascular Assessment Pulses: Posterior Tibial Dorsalis Pedis Palpable: [Left:Yes] Extremity colors, hair growth, and conditions: Extremity Color: [Left:Normal] Hair Growth on Extremity: [Left:Yes] Temperature of Extremity: [Left:Warm] Capillary Refill: [Left:< 3 seconds] Toe Nail Assessment Left: Right: Thick: No Discolored: No Deformed: No Improper Length and Hygiene: No Electronic Signature(s) Signed: 09/10/2015 5:15:29 PM By: Montey Hora Entered By: Montey Hora on 09/10/2015 08:17:43 [name], [name] ([id number]) -------------------------------------------------------------------------------- Multi Wound Chart Details Patient Name: Stuber, Autry J. Date of Service: 09/10/2015 8:00 AM Medical Record Number: [id number] Patient Account Number: [id number] Date of Birth/Sex: [birth year] (54 y.o. 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] [N/A:N/A] Comorbid History: [3:Hypertension, Type II Diabetes, Osteoarthritis, Neuropathy] [N/A:N/A] Date Acquired: [3:04/11/2015] [N/A:N/A] Weeks of Treatment: [3:13] [N/A:N/A] Wound Status: [3:Open] [N/A:N/A] Measurements L x W x D 1.2x2.3x0.1 [N/A:N/A] (cm) Area (cm) : [3:2.168] [N/A:N/A] Volume (cm) : [3:0.217] [N/A:N/A] % Reduction in Area: [3:84.80%] [N/A:N/A] % Reduction in Volume: 94.90% [  N/A:N/A] Classification: [3:Grade 2] [N/A:N/A] Exudate Amount: [3:Medium] [N/A:N/A] Exudate Type: [3:Serosanguineous] [N/A:N/A] Exudate Color: [3:red, brown] [N/A:N/A] Wound Margin: [3:Flat and Intact] [N/A:N/A] Granulation Amount: [3:Large (67-100%)] [N/A:N/A] Granulation Quality: [3:Red, Hyper-granulation] [N/A:N/A] Necrotic Amount: [3:None Present (0%)] [N/A:N/A] Exposed Structures: [3:Fascia: No Fat: No Tendon: No Muscle: No] [N/A:N/A] Joint: No Bone: No Limited to Skin Breakdown Epithelialization: Medium (34-66%) N/A N/A Periwound Skin Texture: Edema: No N/A N/A Excoriation: No Induration: No Callus: No Crepitus: No Fluctuance: No Friable: No Rash: No Scarring: No Periwound Skin Moist: Yes N/A N/A Moisture: Maceration: No Dry/Scaly: No Periwound Skin Color: Atrophie Blanche: No N/A N/A Cyanosis: No Ecchymosis: No Erythema: No Hemosiderin Staining: No Mottled: No Pallor: No Rubor: No Temperature: No Abnormality N/A N/A Tenderness on No N/A  N/A Palpation: Wound Preparation: Ulcer Cleansing: Other: N/A N/A soap and water Topical Anesthetic Applied: Other: lidocaine 4% Treatment Notes Electronic Signature(s) Signed: 09/10/2015 5:15:29 PM By: Montey Hora Entered By: Montey Hora on 09/10/2015 08:23:34 [name], [name] ([id number]) -------------------------------------------------------------------------------- Multi-Disciplinary Care Plan Details Patient Name: [name], Rashaad J. Date of Service: 09/10/2015 8:00 AM Medical Record Number: [id number] Patient Account Number: [id number] Date of Birth/Sex: 11/19/1960 (54 y.o. Male) Treating RN: Montey Hora Primary Care Physician: Frazier Richards Other Clinician: Referring Physician: Frazier Richards Treating Physician/Extender: Frann Rider in Treatment: 13 Active Inactive Venous Leg Ulcer Nursing Diagnoses: Actual venous Insuffiency (use after diagnosis is confirmed) Goals: Patient will maintain optimal edema control Date Initiated: 06/11/2015 Goal Status: Active Patient/caregiver will verbalize understanding of disease process and disease management Date Initiated: 06/11/2015 Goal Status: Active Interventions: Assess peripheral edema status every visit. Compression as ordered Notes: Wound/Skin Impairment Nursing Diagnoses: Impaired tissue integrity Goals: Ulcer/skin breakdown will have a volume reduction of 30% by week 4 Date Initiated: 06/11/2015 Goal Status: Active Ulcer/skin breakdown will have a volume reduction of 50% by week 8 Date Initiated: 06/11/2015 Goal Status: Active Ulcer/skin breakdown will have a volume reduction of 80% by week 12 Date Initiated: 06/11/2015 Goal Status: Active Ulcer/skin breakdown will heal within 14 weeks Date Initiated: 06/11/2015 Hoey, Grayling Congress ([id number]) Goal Status: Active Interventions: Assess patient/caregiver ability to perform ulcer/skin care regimen upon admission and as needed Assess ulceration(s) every  visit Notes: Electronic Signature(s) Signed: 09/10/2015 5:15:29 PM By: Montey Hora Entered By: Montey Hora on 09/10/2015 08:23:27 [name], [name] ([id number]) -------------------------------------------------------------------------------- Patient/Caregiver Education Details Patient Name: [name], Shaka Lenna [name]. Date of Service: 09/10/2015 8:00 AM Medical Record Number: [id number] Patient Account Number: [id number] Date of Birth/Gender: [birth year] (54 y.o. Male) Treating RN: Montey Hora Primary Care Physician: Frazier Richards Other Clinician: Referring Physician: Frazier Richards Treating Physician/Extender: Frann Rider in Treatment: 13 Education Assessment Education Provided To: Patient Education Topics Provided Venous: Handouts: Other: bring compression hose next visit Methods: Explain/Verbal Responses: State content correctly Electronic Signature(s) Signed: 09/10/2015 5:15:29 PM By: Montey Hora Entered By: Montey Hora on 09/10/2015 08:45:25 [name], [name] ([id number]) -------------------------------------------------------------------------------- Wound Assessment Details Patient Name: [name], [name] Date of Service: 09/10/2015 8:00 AM Medical Record Number: [id number] Patient Account Number: [id number] Date of Birth/Sex: 07-25-1961 (54 y.o. Male) Treating RN: Montey Hora Primary Care Physician: Frazier Richards Other Clinician: Referring Physician: Frazier Richards Treating Physician/Extender: Frann Rider in Treatment: 13 Wound Status Wound Number: 3 Primary Diabetic Wound/Ulcer of the Lower Etiology: Extremity Wound Location: Left, Medial Lower Leg Secondary Venous Leg Ulcer Wounding Event: Gradually Appeared Etiology: Date Acquired: 04/11/2015 Wound Status: Open Weeks Of Treatment: 13 Comorbid Hypertension, Type II Diabetes, Clustered Wound: No History: Osteoarthritis, Neuropathy Photos Photo Uploaded  By: Montey Hora on  09/10/2015 09:35:47 Wound Measurements Length: (cm) 1.5 Width: (cm) 2.3 Depth: (cm) 0.1 Area: (cm) 2.71 Volume: (cm) 0.271 % Reduction in Area: 81% % Reduction in Volume: 93.7% Epithelialization: Medium (34-66%) Tunneling: No Undermining: No Wound Description Classification: Grade 2 Wound Margin: Flat and Intact Exudate Amount: Medium Exudate Type: Serosanguineous Exudate Color: red, brown Wound Bed Granulation Amount: Large (67-100%) Exposed Structure Granulation Quality: Red, Hyper-granulation Fascia Exposed: No Necrotic Amount: None Present (0%) Fat Layer Exposed: No Tendon Exposed: No [name], [name] ([id number]) Muscle Exposed: No Joint Exposed: No Bone Exposed: No Limited to Skin Breakdown Periwound Skin Texture Texture Color No Abnormalities Noted: No No Abnormalities Noted: No Callus: No Atrophie Blanche: No Crepitus: No Cyanosis: No Excoriation: No Ecchymosis: No Fluctuance: No Erythema: No Friable: No Hemosiderin Staining: No Induration: No Mottled: No Localized Edema: No Pallor: No Rash: No Rubor: No Scarring: No Temperature / Pain Moisture Temperature: No Abnormality No Abnormalities Noted: No Dry / Scaly: No Maceration: No Moist: Yes Wound Preparation Ulcer Cleansing: Other: soap and water, Topical Anesthetic Applied: Other: lidocaine 4%, Treatment Notes Wound #3 (Left, Medial Lower Leg) 1. Cleansed with: Cleanse wound with antibacterial soap and water 2. Anesthetic Topical Lidocaine 4% cream to wound bed prior to debridement 4. Dressing Applied: Other dressing (specify in notes) 5. Secondary Dressing Applied Foam 7. Secured with Rolena Infante to Left Lower Extremity Notes sorbact Electronic Signature(s) Signed: 09/10/2015 5:15:29 PM By: Montey Hora Entered By: Montey Hora on 09/10/2015 08:30:22 [name], [name] ([id number]) [name], Shyheem J.  ([id number]) -------------------------------------------------------------------------------- Vitals Details Patient Name: Borgeson, Ozil J. Date of Service: 09/10/2015 8:00 AM Medical Record Number: [id number] Patient Account Number: [id number] Date of Birth/Sex: 03-21-1961 (54 y.o. Male) Treating RN: Montey Hora Primary Care Physician: Frazier Richards Other Clinician: Referring Physician: Frazier Richards Treating Physician/Extender: Frann Rider in Treatment: 13 Vital Signs Time Taken: 08:10 Temperature (F): 98.5 Height (in): 74 Pulse (bpm): 80 Weight (lbs): 260 Respiratory Rate (breaths/min): 18 Body Mass Index (BMI): 33.4 Blood Pressure (mmHg): 168/74 Reference Range: 80 - 120 mg / dl Electronic Signature(s) Signed: 09/10/2015 5:15:29 PM By: Montey Hora Entered By: Montey Hora on 09/10/2015 08:12:38

## 2015-09-17 ENCOUNTER — Encounter: Payer: BLUE CROSS/BLUE SHIELD | Admitting: Surgery

## 2015-09-17 DIAGNOSIS — E11622 Type 2 diabetes mellitus with other skin ulcer: Secondary | ICD-10-CM | POA: Diagnosis not present

## 2015-09-17 NOTE — Progress Notes (Signed)
HANSON, MEDEIROS ([id number]) Visit Report for 09/17/2015 Arrival Information Details Patient Name: [name], [name]. Date of Service: 09/17/2015 9:00 AM Medical Record Number: [id number] Patient Account Number: [id number] Date of Birth/Sex: Apr 01, 1961 (54 y.o. Male) Treating RN: [name] Primary Care Physician: [name] Other Clinician: Referring Physician: Frazier [name] Treating Physician/Extender: [name] in Treatment: 14 Visit Information History Since Last Visit Added or deleted any medications: No Patient Arrived: Ambulatory Any new allergies or adverse reactions: No Arrival Time: 09:01 Had a fall or experienced change in No Accompanied By: self activities of daily living that may affect Transfer Assistance: None risk of falls: Patient Identification Verified: Yes Signs or symptoms of abuse/neglect since last No Secondary Verification Process Yes visito Completed: Hospitalized since last visit: No Patient Has Alerts: Yes Has Dressing in Place as Prescribed: Yes Has Compression in Place as Prescribed: Yes Pain Present Now: No Electronic Signature(s) Signed: 09/17/2015 4:10:23 PM By: [name] Cool, RN, BSN, Kim RN, BSN Entered By: [name] on 09/17/2015 09:01:58 [name] ([id number]) -------------------------------------------------------------------------------- Clinic Level of Care Assessment Details Patient Name: [name], [name] Date of Service: 09/17/2015 9:00 AM Medical Record Number: [id number] Patient Account Number: [id number] Date of Birth/Sex: 1961-02-12 (54 y.o. Male) Treating RN: [name] Primary Care Physician: [name] Other Clinician: Referring Physician: Frazier [name] Treating Physician/Extender: [name] in Treatment: 14 Clinic Level of Care Assessment Items TOOL 4 Quantity Score []  - Use when only an EandM is performed on FOLLOW-UP visit 0 ASSESSMENTS - Nursing Assessment /  Reassessment []  - Reassessment of Co-morbidities (includes updates in patient status) 0 X - Reassessment of Adherence to Treatment Plan 1 5 ASSESSMENTS - Wound and Skin Assessment / Reassessment X - Simple Wound Assessment / Reassessment - one wound 1 5 []  - Complex Wound Assessment / Reassessment - multiple wounds 0 []  - Dermatologic / Skin Assessment (not related to wound area) 0 ASSESSMENTS - Focused Assessment []  - Circumferential Edema Measurements - multi extremities 0 []  - Nutritional Assessment / Counseling / Intervention 0 []  - Lower Extremity Assessment (monofilament, tuning fork, pulses) 0 []  - Peripheral Arterial Disease Assessment (using hand held doppler) 0 ASSESSMENTS - Ostomy and/or Continence Assessment and Care []  - Incontinence Assessment and Management 0 []  - Ostomy Care Assessment and Management (repouching, etc.) 0 PROCESS - Coordination of Care X - Simple Patient / Family Education for ongoing care 1 15 []  - Complex (extensive) Patient / Family Education for ongoing care 0 []  - Staff obtains Programmer, systems, Records, Test Results / Process Orders 0 []  - Staff telephones HHA, Nursing Homes / Clarify orders / etc 0 []  - Routine Transfer to another Facility (non-emergent condition) 0 [name], [name] ([id number]) []  - Routine Hospital Admission (non-emergent condition) 0 []  - New Admissions / Biomedical engineer / Ordering NPWT, Apligraf, etc. 0 []  - Emergency Hospital Admission (emergent condition) 0 X - Simple Discharge Coordination 1 10 []  - Complex (extensive) Discharge Coordination 0 PROCESS - Special Needs []  - Pediatric / Minor Patient Management 0 []  - Isolation Patient Management 0 []  - Hearing / Language / Visual special needs 0 []  - Assessment of Community assistance (transportation, D/C planning, etc.) 0 []  - Additional assistance / Altered mentation 0 []  - Support Surface(s) Assessment (bed, cushion, seat, etc.) 0 INTERVENTIONS - Wound Cleansing /  Measurement []  - Simple Wound Cleansing - one wound 0 []  - Complex Wound Cleansing - multiple wounds 0 []  - Wound Imaging (photographs - any number of  wounds) 0 []  - Wound Tracing (instead of photographs) 0 []  - Simple Wound Measurement - one wound 0 []  - Complex Wound Measurement - multiple wounds 0 INTERVENTIONS - Wound Dressings []  - Small Wound Dressing one or multiple wounds 0 []  - Medium Wound Dressing one or multiple wounds 0 []  - Large Wound Dressing one or multiple wounds 0 []  - Application of Medications - topical 0 []  - Application of Medications - injection 0 INTERVENTIONS - Miscellaneous []  - External ear exam 0 [name], [name] ([id number]) []  - Specimen Collection (cultures, biopsies, blood, body fluids, etc.) 0 []  - Specimen(s) / Culture(s) sent or taken to Lab for analysis 0 []  - Patient Transfer (multiple staff / Harrel Lemon Lift / Similar devices) 0 []  - Simple Staple / Suture removal (25 or less) 0 []  - Complex Staple / Suture removal (26 or more) 0 []  - Hypo / Hyperglycemic Management (close monitor of Blood Glucose) 0 []  - Ankle / Brachial Index (ABI) - do not check if billed separately 0 X - Vital Signs 1 5 Has the patient been seen at the hospital within the last three years: Yes Total Score: 40 Level Of Care: New/Established - Level 2 Electronic Signature(s) Signed: 09/17/2015 4:10:23 PM By: [name] Cool, RN, BSN, Kim RN, BSN Entered By: [name] on 09/17/2015 09:21:19 [name] ([id number]) -------------------------------------------------------------------------------- Encounter Discharge Information Details Patient Name: [name] Montana, [name] Date of Service: 09/17/2015 9:00 AM Medical Record Number: [id number] Patient Account Number: [id number] Date of Birth/Sex: [birth year] (54 y.o. Male) Treating RN: [name] Primary Care Physician: [name] Other Clinician: Referring Physician: Frazier [name] Treating Physician/Extender: [name] in Treatment: 14 Encounter Discharge Information Items Discharge Pain Level: 0 Discharge Condition: Stable Ambulatory Status: Ambulatory Discharge Destination: Home Private Transportation: Auto Accompanied By: self Schedule Follow-up Appointment: Yes Medication Reconciliation completed and Yes provided to Patient/Care Ethell Blatchford: Clinical Summary of Care: Electronic Signature(s) Signed: 09/17/2015 4:10:23 PM By: [name] Cool, RN, BSN, Kim RN, BSN Entered By: [name] on 09/17/2015 09:26:14 Knopf, Salvatore Lenna [name] ([id number]) -------------------------------------------------------------------------------- Lower Extremity Assessment Details Patient Name: Sanson, [name] Date of Service: 09/17/2015 9:00 AM Medical Record Number: [id number] Patient Account Number: [id number] Date of Birth/Sex: 01-20-1961 (53 y.o. Male) Treating RN: [name] Primary Care Physician: [name] Other Clinician: Referring Physician: Frazier [name] Treating Physician/Extender: [name] in Treatment: 14 Edema Assessment Assessed: Shirlyn Goltz: No] [Right: No] E[Left: dema] [Right: :] Calf Left: Right: Point of Measurement: 34 cm From Medial Instep 36.5 cm cm Ankle Left: Right: Point of Measurement: 10 cm From Medial Instep 27.9 cm cm Vascular Assessment Pulses: Posterior Tibial Palpable: [Left:Yes] Dorsalis Pedis Palpable: [Left:Yes] Extremity colors, hair growth, and conditions: Extremity Color: [Left:Normal] Hair Growth on Extremity: [Left:Yes] Temperature of Extremity: [Left:Warm] Capillary Refill: [Left:< 3 seconds] Toe Nail Assessment Left: Right: Thick: No Discolored: No Deformed: No Improper Length and Hygiene: No Electronic Signature(s) Signed: 09/17/2015 4:10:23 PM By: [name] Cool, RN, BSN, Kim RN, BSN Entered By: [name] on 09/17/2015 09:09:22 Poyser, Biran Lenna [name] ([id number]) Boggess, Cahlil J.  ([id number]) -------------------------------------------------------------------------------- Multi Wound Chart Details Patient Name: Weniger, [name] Date of Service: 09/17/2015 9:00 AM Medical Record Number: [id number] Patient Account Number: [id number] Date of Birth/Sex: 02/27/1961 (54 y.o. 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] [N/A:N/A] Comorbid History: [3:Hypertension, Type II Diabetes, Osteoarthritis, Neuropathy] [N/A:N/A] Date Acquired: [3:04/11/2015] [N/A:N/A] Weeks of Treatment: [3:14] [N/A:N/A] Wound Status: [3:Healed - Epithelialized] [N/A:N/A] Measurements L x W x D 0x0x0 [N/A:N/A] (cm) Area (cm) : [3:0] [N/A:N/A] Volume (cm) : [3:0] [N/A:N/A] % Reduction in Area: [3:100.00%] [N/A:N/A] % Reduction in Volume: 100.00% [N/A:N/A] Classification: [3:Grade 2] [N/A:N/A] Exudate Amount: [3:Medium] [N/A:N/A] Exudate Type: [3:Serosanguineous] [N/A:N/A] Exudate Color: [3:red, brown] [N/A:N/A] Wound Margin: [3:Flat and Intact] [N/A:N/A] Granulation Amount: [3:Large (67-100%)] [N/A:N/A] Granulation Quality: [3:Red, Hyper-granulation] [N/A:N/A] Necrotic Amount: [3:None Present (0%)] [N/A:N/A] Exposed Structures: [3:Fascia: No Fat: No Tendon: No Muscle: No] [N/A:N/A] Joint: No Bone: No Limited to Skin Breakdown Epithelialization: Medium (34-66%) N/A N/A Periwound Skin Texture: Edema: No N/A  N/A Excoriation: No Induration: No Callus: No Crepitus: No Fluctuance: No Friable: No Rash: No Scarring: No Periwound Skin Moist: Yes N/A N/A Moisture: Maceration: No Dry/Scaly: No Periwound Skin Color: Atrophie Blanche: No N/A N/A Cyanosis: No Ecchymosis: No Erythema: No Hemosiderin Staining: No Mottled: No Pallor: No Rubor: No Temperature: No Abnormality N/A N/A Tenderness on No N/A N/A Palpation: Wound Preparation: Ulcer Cleansing: Other: N/A N/A soap and water Topical Anesthetic Applied: Other: lidocaine 4% Treatment Notes Electronic Signature(s) Signed: 09/17/2015 4:10:23 PM By: [name] Cool, RN, BSN, Kim RN, BSN Entered By: [name] on 09/17/2015 09:20:39 Clason, [name] ([id number]) -------------------------------------------------------------------------------- Multi-Disciplinary Care Plan Details Patient Name: JANES, COLEGROVE. Date of Service: 09/17/2015 9:00 AM Medical Record Number: [id number] Patient Account Number: [id number] Date of Birth/Sex: 1961-02-09 (54 y.o. Male) Treating RN: [name] Primary Care Physician: [name] Other Clinician: Referring Physician: Frazier [name] Treating Physician/Extender: [name] in Treatment: 14 Active Inactive Electronic Signature(s) Signed: 09/17/2015 4:10:23 PM By: [name] Cool, RN, BSN, Kim RN, BSN Entered By: [name] on 09/17/2015 09:20:32 Cowden, [name] ([id number]) -------------------------------------------------------------------------------- Pain Assessment Details Patient Name: BOAKYE, [name] Date of Service: 09/17/2015 9:00 AM Medical Record Number: [id number] Patient Account Number: [id number] Date of Birth/Sex: 10-Oct-1961 (54 y.o. Male) Treating RN: [name] Primary Care Physician: [name] Other Clinician: Referring Physician: Frazier [name] Treating Physician/Extender: [name] in Treatment: 14 Active Problems Location of Pain  Severity and Description of Pain Patient Has Paino No Site Locations Pain Management and Medication Current Pain Management: Electronic Signature(s) Signed: 09/17/2015 4:10:23 PM By: [name] Cool, RN, BSN, Kim RN, BSN Entered By: [name] on 09/17/2015 09:02:03 Ferrell, [name] ([id number]) -------------------------------------------------------------------------------- Patient/Caregiver Education Details Patient Name: [name] Marin. Date of Service: 09/17/2015 9:00 AM Medical Record Number: [id number] Patient Account Number: [id number] Date of Birth/Gender: 11/13/1960 (54 y.o. Male) Treating RN: [name] Primary Care Physician: [name] Other Clinician: Referring Physician: Frazier [name] Treating Physician/Extender: [name] in Treatment: 14 Education Assessment Education Provided To: Patient Education Topics Provided Venous: Controlling Swelling with Compression Stockings , Other: patient to wear compression Handouts: stockings daily Methods: Demonstration, Explain/Verbal Responses: State content correctly Electronic Signature(s) Signed: 09/17/2015 4:10:23 PM By: [name] Cool, RN, BSN, Kim RN, BSN Entered By: [name] on 09/17/2015 09:26:48 Randa, [name] ([id number]) -------------------------------------------------------------------------------- Wound Assessment Details Patient Name: Niederer, [name] Date of Service: 09/17/2015 9:00 AM Medical Record Number: [id number] Patient Account Number: [id number] Date of Birth/Sex: [birth year] (54 y.o. Male) Treating RN: [name] Primary Care Physician: Ouida Sills,  Ruthann Cancer Other Clinician: Referring Physician: Frazier [name] Treating Physician/Extender: [name] in Treatment: 14 Wound Status Wound Number: 3 Primary Diabetic Wound/Ulcer of the Lower Etiology: Extremity Wound Location: Left Lower Leg - Medial Secondary Venous Leg Ulcer Wounding Event: Gradually  Appeared Etiology: Date Acquired: 04/11/2015 Wound Status: Healed - Epithelialized Weeks Of Treatment: 14 Comorbid Hypertension, Type II Diabetes, Clustered Wound: No History: Osteoarthritis, Neuropathy Photos Photo Uploaded By: [name] on 09/17/2015 09:29:39 Wound Measurements Length: (cm) 0 % Reduction in Width: (cm) 0 % Reduction in Depth: (cm) 0 Epithelializati Area: (cm) 0 Volume: (cm) 0 Area: 100% Volume: 100% on: Medium (34-66%) Wound Description Classification: Grade 2 Wound Margin: Flat and Intact Exudate Amount: Medium Exudate Type: Serosanguineous Exudate Color: red, brown Wound Bed Granulation Amount: Large (67-100%) Exposed Structure Granulation Quality: Red, Hyper-granulation Fascia Exposed: No Necrotic Amount: None Present (0%) Fat Layer Exposed: No Tendon Exposed: No Dejarnett, Koleson J. ([id number]) Muscle Exposed: No Joint Exposed: No Bone Exposed: No Limited to Skin Breakdown Periwound Skin Texture Texture Color No Abnormalities Noted: No No Abnormalities Noted: No Callus: No Atrophie Blanche: No Crepitus: No Cyanosis: No Excoriation: No Ecchymosis: No Fluctuance: No Erythema: No Friable: No Hemosiderin Staining: No Induration: No Mottled: No Localized Edema: No Pallor: No Rash: No Rubor: No Scarring: No Temperature / Pain Moisture Temperature: No Abnormality No Abnormalities Noted: No Dry / Scaly: No Maceration: No Moist: Yes Wound Preparation Ulcer Cleansing: Other: soap and water, Topical Anesthetic Applied: Other: lidocaine 4%, Electronic Signature(s) Signed: 09/17/2015 4:10:23 PM By: [name] Cool, RN, BSN, Kim RN, BSN Entered By: [name] on 09/17/2015 09:19:58 Lefkowitz, [name] ([id number]) -------------------------------------------------------------------------------- Vitals Details Patient Name: [name] Marin. Date of Service: 09/17/2015 9:00 AM Medical Record Number: [id number] Patient Account  Number: [id number] Date of Birth/Sex: 12/26/1960 (54 y.o. Male) Treating RN: [name] Primary Care Physician: [name] Other Clinician: Referring Physician: Frazier [name] Treating Physician/Extender: [name] in Treatment: 14 Vital Signs Time Taken: 09:04 Temperature (F): 97.7 Height (in): 74 Pulse (bpm): 73 Weight (lbs): 260 Respiratory Rate (breaths/min): 16 Body Mass Index (BMI): 33.4 Blood Pressure (mmHg): 135/63 Reference Range: 80 - 120 mg / dl Electronic Signature(s) Signed: 09/17/2015 4:10:23 PM By: [name] Cool, RN, BSN, Kim RN, BSN Entered By: [name] on 09/17/2015 09:04:37
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
## 2015-09-30 ENCOUNTER — Encounter: Payer: Self-pay | Admitting: *Deleted

## 2015-10-07 NOTE — Discharge Instructions (Signed)

## 2015-10-09 ENCOUNTER — Ambulatory Visit: Payer: BLUE CROSS/BLUE SHIELD | Admitting: Anesthesiology

## 2015-10-09 ENCOUNTER — Encounter
Admission: RE | Disposition: A | Discharge status: Home / Self Care | Payer: Self-pay | Source: Ambulatory Visit | Attending: Ophthalmology

## 2015-10-09 ENCOUNTER — Ambulatory Visit
Admission: RE | Admit: 2015-10-09 | Discharge: 2015-10-09 | Disposition: A | Discharge status: Home / Self Care | Payer: BLUE CROSS/BLUE SHIELD | Source: Ambulatory Visit | Attending: Ophthalmology | Admitting: Ophthalmology

## 2015-10-09 DIAGNOSIS — E7800 Pure hypercholesterolemia, unspecified: Secondary | ICD-10-CM | POA: Diagnosis not present

## 2015-10-09 DIAGNOSIS — Z7982 Long term (current) use of aspirin: Secondary | ICD-10-CM | POA: Insufficient documentation

## 2015-10-09 DIAGNOSIS — Z79899 Other long term (current) drug therapy: Secondary | ICD-10-CM | POA: Diagnosis not present

## 2015-10-09 DIAGNOSIS — Z7984 Long term (current) use of oral hypoglycemic drugs: Secondary | ICD-10-CM | POA: Insufficient documentation

## 2015-10-09 DIAGNOSIS — Z9049 Acquired absence of other specified parts of digestive tract: Secondary | ICD-10-CM | POA: Diagnosis not present

## 2015-10-09 DIAGNOSIS — K449 Diaphragmatic hernia without obstruction or gangrene: Secondary | ICD-10-CM | POA: Diagnosis not present

## 2015-10-09 DIAGNOSIS — Z9889 Other specified postprocedural states: Secondary | ICD-10-CM | POA: Diagnosis not present

## 2015-10-09 DIAGNOSIS — H2511 Age-related nuclear cataract, right eye: Secondary | ICD-10-CM | POA: Diagnosis not present

## 2015-10-09 DIAGNOSIS — I10 Essential (primary) hypertension: Secondary | ICD-10-CM | POA: Diagnosis not present

## 2015-10-09 DIAGNOSIS — H269 Unspecified cataract: Secondary | ICD-10-CM | POA: Diagnosis present

## 2015-10-09 DIAGNOSIS — Z87891 Personal history of nicotine dependence: Secondary | ICD-10-CM | POA: Insufficient documentation

## 2015-10-09 DIAGNOSIS — Z87442 Personal history of urinary calculi: Secondary | ICD-10-CM | POA: Insufficient documentation

## 2015-10-09 HISTORY — DX: Personal history of other diseases of the digestive system: Z87.19

## 2015-10-09 HISTORY — DX: Other complications of anesthesia, initial encounter: T88.59XA

## 2015-10-09 HISTORY — DX: Edema, unspecified: R60.9

## 2015-10-09 HISTORY — DX: Other specified postprocedural states: Z98.890

## 2015-10-09 HISTORY — DX: Nausea with vomiting, unspecified: R11.2

## 2015-10-09 HISTORY — DX: Anemia, unspecified: D64.9

## 2015-10-09 HISTORY — DX: Unspecified osteoarthritis, unspecified site: M19.90

## 2015-10-09 HISTORY — DX: Chronic kidney disease, unspecified: N18.9

## 2015-10-09 HISTORY — PX: CATARACT EXTRACTION W/PHACO: SHX586

## 2015-10-09 HISTORY — DX: Adverse effect of unspecified anesthetic, initial encounter: T41.45XA

## 2015-10-09 LAB — GLUCOSE, CAPILLARY: Glucose-Capillary: 116 mg/dL — ABNORMAL HIGH (ref 65–99)

## 2015-10-09 SURGERY — PHACOEMULSIFICATION, CATARACT, WITH IOL INSERTION
Anesthesia: Monitor Anesthesia Care | Site: Eye | Laterality: Right | Wound class: Clean

## 2015-10-09 MED ORDER — POVIDONE-IODINE 5 % OP SOLN
1.0000 "application " | OPHTHALMIC | Status: DC | PRN
  Administered 2015-10-09: 1 via OPHTHALMIC

## 2015-10-09 MED ORDER — EPINEPHRINE HCL 1 MG/ML IJ SOLN
INTRAOCULAR | Status: DC | PRN
  Administered 2015-10-09: 58 mL via OPHTHALMIC

## 2015-10-09 MED ORDER — TETRACAINE HCL 0.5 % OP SOLN
1.0000 [drp] | OPHTHALMIC | Status: DC | PRN
  Administered 2015-10-09: 1 [drp] via OPHTHALMIC

## 2015-10-09 MED ORDER — ACETAMINOPHEN 160 MG/5ML PO SOLN: 325.0000 mg | ORAL | Status: DC | PRN

## 2015-10-09 MED ORDER — TIMOLOL MALEATE 0.5 % OP SOLN
OPHTHALMIC | Status: DC | PRN
  Administered 2015-10-09: 1 [drp] via OPHTHALMIC

## 2015-10-09 MED ORDER — LIDOCAINE HCL (PF) 4 % IJ SOLN
INTRAOCULAR | Status: DC | PRN
  Administered 2015-10-09: 1 mL via OPHTHALMIC

## 2015-10-09 MED ORDER — BRIMONIDINE TARTRATE 0.2 % OP SOLN
OPHTHALMIC | Status: DC | PRN
  Administered 2015-10-09: 1 [drp] via OPHTHALMIC

## 2015-10-09 MED ORDER — ACETAMINOPHEN 325 MG PO TABS: 325.0000 mg | ORAL_TABLET | ORAL | Status: DC | PRN

## 2015-10-09 MED ORDER — FENTANYL CITRATE (PF) 100 MCG/2ML IJ SOLN
INTRAMUSCULAR | Status: DC | PRN
  Administered 2015-10-09: 100 ug via INTRAVENOUS

## 2015-10-09 MED ORDER — MIDAZOLAM HCL 2 MG/2ML IJ SOLN
INTRAMUSCULAR | Status: DC | PRN
  Administered 2015-10-09: 2 mg via INTRAVENOUS

## 2015-10-09 MED ORDER — ARMC OPHTHALMIC DILATING GEL
1.0000 "application " | OPHTHALMIC | Status: DC | PRN
  Administered 2015-10-09 (×2): 1 via OPHTHALMIC

## 2015-10-09 MED ORDER — CEFUROXIME OPHTHALMIC INJECTION 1 MG/0.1 ML
INJECTION | OPHTHALMIC | Status: DC | PRN
  Administered 2015-10-09: 0.1 mL via OPHTHALMIC

## 2015-10-09 MED ORDER — NA HYALUR & NA CHOND-NA HYALUR 0.4-0.35 ML IO KIT
PACK | INTRAOCULAR | Status: DC | PRN
  Administered 2015-10-09: 1 mL via INTRAOCULAR

## 2015-10-09 SURGICAL SUPPLY — 26 items
CANNULA ANT/CHMB 27GA (MISCELLANEOUS) ×3 IMPLANT
GLOVE SURG LX 7.5 STRW (GLOVE) ×2
GLOVE SURG LX STRL 7.5 STRW (GLOVE) ×1 IMPLANT
GLOVE SURG TRIUMPH 8.0 PF LTX (GLOVE) ×3 IMPLANT
GOWN STRL REUS W/ TWL LRG LVL3 (GOWN DISPOSABLE) ×2 IMPLANT
GOWN STRL REUS W/TWL LRG LVL3 (GOWN DISPOSABLE) ×4
LENS IOL TECNIS 23.5 (Intraocular Lens) ×3 IMPLANT
LENS IOL TECNIS MONO 1P 23.5 (Intraocular Lens) ×1 IMPLANT
MARKER SKIN SURG W/RULER VIO (MISCELLANEOUS) ×3 IMPLANT
NDL RETROBULBAR .5 NSTRL (NEEDLE) IMPLANT
NEEDLE FILTER BLUNT 18X 1/2SAF (NEEDLE) ×4
NEEDLE FILTER BLUNT 18X1 1/2 (NEEDLE) ×2 IMPLANT
PACK CATARACT BRASINGTON (MISCELLANEOUS) ×3 IMPLANT
PACK EYE AFTER SURG (MISCELLANEOUS) ×3 IMPLANT
PACK OPTHALMIC (MISCELLANEOUS) ×3 IMPLANT
RING MALYGIN 7.0 (MISCELLANEOUS) IMPLANT
SUT ETHILON 10-0 CS-B-6CS-B-6 (SUTURE)
SUT VICRYL  9 0 (SUTURE)
SUT VICRYL 9 0 (SUTURE) IMPLANT
SUTURE EHLN 10-0 CS-B-6CS-B-6 (SUTURE) IMPLANT
SYR 3ML LL SCALE MARK (SYRINGE) ×6 IMPLANT
SYR 5ML LL (SYRINGE) IMPLANT
SYR TB 1ML LUER SLIP (SYRINGE) ×3 IMPLANT
WATER STERILE IRR 250ML POUR (IV SOLUTION) ×3 IMPLANT
WATER STERILE IRR 500ML POUR (IV SOLUTION) IMPLANT
WIPE NON LINTING 3.25X3.25 (MISCELLANEOUS) ×3 IMPLANT

## 2015-10-09 NOTE — Transfer of Care (Signed)
Immediate Anesthesia Transfer of Care Note  Patient: [name]  Procedure(s) Performed: Procedure(s) with comments: CATARACT EXTRACTION PHACO AND INTRAOCULAR LENS PLACEMENT (IOC) (Right) - DIABETIC - oral meds  Patient Location: PACU  Anesthesia Type: MAC  Level of Consciousness: awake, alert  and patient cooperative  Airway and Oxygen Therapy: Patient Spontanous Breathing and Patient connected to supplemental oxygen  Post-op Assessment: Post-op Vital signs reviewed, Patient's Cardiovascular Status Stable, Respiratory Function Stable, Patent Airway and No signs of Nausea or vomiting  Post-op Vital Signs: Reviewed and stable  Complications: No apparent anesthesia complications

## 2015-10-09 NOTE — Anesthesia Procedure Notes (Signed)
Procedure Name: MAC Performed by: Dilynn Munroe Pre-anesthesia Checklist: Patient identified, Emergency Drugs available, Suction available, Timeout performed and Patient being monitored Patient Re-evaluated:Patient Re-evaluated prior to inductionOxygen Delivery Method: Nasal cannula Placement Confirmation: positive ETCO2       

## 2015-10-09 NOTE — Op Note (Signed)
LOCATION:  [place]   PREOPERATIVE DIAGNOSIS:    Nuclear sclerotic cataract right eye. H25.11   POSTOPERATIVE DIAGNOSIS:  Nuclear sclerotic cataract right eye.     PROCEDURE:  Phacoemusification with posterior chamber intraocular lens placement of the right eye   LENS:   Implant Name Type Inv. Item Serial No. Manufacturer Lot No. LRB No. Used  LENS IMPL INTRAOC [id number] 23.5 - [id number] Intraocular Lens LENS IMPL INTRAOC [id number] 23.5 [id number]   Right 1        ULTRASOUND TIME: 12 % of 0 minutes, 58 seconds.  CDE 7.3   SURGEON:  Wyonia Hough, MD   ANESTHESIA:  Topical with tetracaine drops and 2% Xylocaine jelly, augmented with 1% preservative-free intracameral lidocaine.    COMPLICATIONS:  None.   DESCRIPTION OF PROCEDURE:  The patient was identified in the holding room and transported to the operating room and placed in the supine position under the operating microscope.  The right eye was identified as the operative eye and it was prepped and draped in the usual sterile ophthalmic fashion.   A 1 millimeter clear-corneal paracentesis was made at the 12:00 position.  0.5 ml of preservative-free 1% lidocaine was injected into the anterior chamber. The anterior chamber was filled with Viscoat viscoelastic.  A 2.4 millimeter keratome was used to make a near-clear corneal incision at the 9:00 position.  A curvilinear capsulorrhexis was made with a cystotome and capsulorrhexis forceps.  Balanced salt solution was used to hydrodissect and hydrodelineate the nucleus.   Phacoemulsification was then used in stop and chop fashion to remove the lens nucleus and epinucleus.  The remaining cortex was then removed using the irrigation and aspiration handpiece. Provisc was then placed into the capsular bag to distend it for lens placement.  A lens was then injected into the capsular bag.  The remaining viscoelastic was aspirated.   Wounds were hydrated with balanced salt  solution.  The anterior chamber was inflated to a physiologic pressure with balanced salt solution.  No wound leaks were noted. Cefuroxime 0.1 ml of a 10mg /ml solution was injected into the anterior chamber for a dose of 1 mg of intracameral antibiotic at the completion of the case.   Timolol and Brimonidine drops were applied to the eye.  The patient was taken to the recovery room in stable condition without complications of anesthesia or surgery.   Ashana Tullo 10/09/2015, 11:07 AM

## 2015-10-09 NOTE — Anesthesia Preprocedure Evaluation (Signed)
Anesthesia Evaluation  Patient identified by MRN, date of birth, ID band Patient awake    Reviewed: NPO status , Patient's Chart, lab work & pertinent test results  History of Anesthesia Complications (+) PONV and history of anesthetic complications  Airway Mallampati: II  TM Distance: >3 FB Neck ROM: Full    Dental   Pulmonary    Pulmonary exam normal        Cardiovascular hypertension, + Peripheral Vascular Disease  Normal cardiovascular exam  10/08/15 Stress test: normal EF with no wall motion abnormalities    Neuro/Psych    GI/Hepatic hiatal hernia,   Endo/Other  diabetes  Renal/GU Renal disease     Musculoskeletal  (+) Arthritis ,   Abdominal   Peds  Hematology  (+) anemia ,   Anesthesia Other Findings   Reproductive/Obstetrics                             Anesthesia Physical Anesthesia Plan  ASA: III  Anesthesia Plan: MAC   Post-op Pain Management:    Induction:   Airway Management Planned:   Additional Equipment:   Intra-op Plan:   Post-operative Plan:   Informed Consent: I have reviewed the patients History and Physical, chart, labs and discussed the procedure including the risks, benefits and alternatives for the proposed anesthesia with the patient or authorized representative who has indicated his/her understanding and acceptance.     Plan Discussed with: CRNA  Anesthesia Plan Comments:         Anesthesia Quick Evaluation

## 2015-10-09 NOTE — H&P (Signed)
  The History and Physical notes are on paper, have been signed, and are to be scanned. The patient remains stable and unchanged from the H&P.   Previous H&P reviewed, patient examined, and there are no changes.  Anhad Sheeley 10/09/2015 10:08 AM

## 2015-10-09 NOTE — Anesthesia Postprocedure Evaluation (Signed)
Anesthesia Post Note  Patient: [name]  Procedure(s) Performed: Procedure(s) (LRB): CATARACT EXTRACTION PHACO AND INTRAOCULAR LENS PLACEMENT (IOC) (Right)  Patient location during evaluation: PACU Anesthesia Type: MAC Level of consciousness: awake and alert Pain management: pain level controlled Vital Signs Assessment: post-procedure vital signs reviewed and stable Respiratory status: spontaneous breathing, nonlabored ventilation, respiratory function stable and patient connected to nasal cannula oxygen Cardiovascular status: blood pressure returned to baseline and stable Postop Assessment: no signs of nausea or vomiting Anesthetic complications: no    Amaryllis Dyke

## 2015-10-10 ENCOUNTER — Encounter: Payer: Self-pay | Admitting: Ophthalmology

## 2016-02-12 ENCOUNTER — Encounter: Payer: Self-pay | Admitting: Emergency Medicine

## 2016-02-12 ENCOUNTER — Inpatient Hospital Stay
Admission: EM | Admit: 2016-02-12 | Discharge: 2016-02-16 | DRG: 375 | Disposition: A | Discharge status: Home / Self Care | Payer: BLUE CROSS/BLUE SHIELD | Attending: Internal Medicine | Admitting: Internal Medicine

## 2016-02-12 DIAGNOSIS — D123 Benign neoplasm of transverse colon: Secondary | ICD-10-CM | POA: Diagnosis present

## 2016-02-12 DIAGNOSIS — Z9049 Acquired absence of other specified parts of digestive tract: Secondary | ICD-10-CM | POA: Diagnosis not present

## 2016-02-12 DIAGNOSIS — E1122 Type 2 diabetes mellitus with diabetic chronic kidney disease: Secondary | ICD-10-CM | POA: Diagnosis present

## 2016-02-12 DIAGNOSIS — K921 Melena: Secondary | ICD-10-CM | POA: Diagnosis present

## 2016-02-12 DIAGNOSIS — D62 Acute posthemorrhagic anemia: Secondary | ICD-10-CM | POA: Diagnosis present

## 2016-02-12 DIAGNOSIS — N184 Chronic kidney disease, stage 4 (severe): Secondary | ICD-10-CM | POA: Diagnosis present

## 2016-02-12 DIAGNOSIS — E785 Hyperlipidemia, unspecified: Secondary | ICD-10-CM | POA: Diagnosis present

## 2016-02-12 DIAGNOSIS — Z8249 Family history of ischemic heart disease and other diseases of the circulatory system: Secondary | ICD-10-CM

## 2016-02-12 DIAGNOSIS — Z87442 Personal history of urinary calculi: Secondary | ICD-10-CM

## 2016-02-12 DIAGNOSIS — Z7982 Long term (current) use of aspirin: Secondary | ICD-10-CM | POA: Diagnosis not present

## 2016-02-12 DIAGNOSIS — N179 Acute kidney failure, unspecified: Secondary | ICD-10-CM | POA: Diagnosis present

## 2016-02-12 DIAGNOSIS — C182 Malignant neoplasm of ascending colon: Principal | ICD-10-CM | POA: Diagnosis present

## 2016-02-12 DIAGNOSIS — M1990 Unspecified osteoarthritis, unspecified site: Secondary | ICD-10-CM | POA: Diagnosis present

## 2016-02-12 DIAGNOSIS — Z79899 Other long term (current) drug therapy: Secondary | ICD-10-CM

## 2016-02-12 DIAGNOSIS — I129 Hypertensive chronic kidney disease with stage 1 through stage 4 chronic kidney disease, or unspecified chronic kidney disease: Secondary | ICD-10-CM | POA: Diagnosis present

## 2016-02-12 DIAGNOSIS — K210 Gastro-esophageal reflux disease with esophagitis: Secondary | ICD-10-CM | POA: Diagnosis present

## 2016-02-12 DIAGNOSIS — Z961 Presence of intraocular lens: Secondary | ICD-10-CM | POA: Diagnosis present

## 2016-02-12 DIAGNOSIS — K6389 Other specified diseases of intestine: Secondary | ICD-10-CM

## 2016-02-12 DIAGNOSIS — Z9841 Cataract extraction status, right eye: Secondary | ICD-10-CM

## 2016-02-12 DIAGNOSIS — K922 Gastrointestinal hemorrhage, unspecified: Secondary | ICD-10-CM | POA: Diagnosis present

## 2016-02-12 DIAGNOSIS — K640 First degree hemorrhoids: Secondary | ICD-10-CM | POA: Diagnosis present

## 2016-02-12 DIAGNOSIS — D649 Anemia, unspecified: Secondary | ICD-10-CM | POA: Diagnosis present

## 2016-02-12 DIAGNOSIS — Z9889 Other specified postprocedural states: Secondary | ICD-10-CM

## 2016-02-12 LAB — GLUCOSE, CAPILLARY
GLUCOSE-CAPILLARY: 69 mg/dL (ref 65–99)
Glucose-Capillary: 89 mg/dL (ref 65–99)

## 2016-02-12 LAB — COMPREHENSIVE METABOLIC PANEL
ALT: 9 U/L — ABNORMAL LOW (ref 17–63)
AST: 20 U/L (ref 15–41)
Albumin: 3.6 g/dL (ref 3.5–5.0)
Alkaline Phosphatase: 78 U/L (ref 38–126)
Anion gap: 7 (ref 5–15)
BUN: 34 mg/dL — AB (ref 6–20)
CALCIUM: 8.2 mg/dL — AB (ref 8.9–10.3)
CO2: 21 mmol/L — ABNORMAL LOW (ref 22–32)
CREATININE: 3.84 mg/dL — AB (ref 0.61–1.24)
Chloride: 106 mmol/L (ref 101–111)
GFR calc Af Amer: 19 mL/min — ABNORMAL LOW (ref >60)
GFR, EST NON AFRICAN AMERICAN: 17 mL/min — AB (ref >60)
Glucose, Bld: 144 mg/dL — ABNORMAL HIGH (ref 65–99)
Potassium: 4.1 mmol/L (ref 3.5–5.1)
Sodium: 134 mmol/L — ABNORMAL LOW (ref 135–145)
TOTAL PROTEIN: 7.4 g/dL (ref 6.5–8.1)
Total Bilirubin: 0.4 mg/dL (ref 0.3–1.2)

## 2016-02-12 LAB — CBC
HCT: 19.5 % — ABNORMAL LOW (ref 40.0–52.0)
Hemoglobin: 6 g/dL — ABNORMAL LOW (ref 13.0–18.0)
MCH: 22.9 pg — ABNORMAL LOW (ref 26.0–34.0)
MCHC: 30.7 g/dL — AB (ref 32.0–36.0)
MCV: 74.6 fL — ABNORMAL LOW (ref 80.0–100.0)
PLATELETS: 321 10*3/uL (ref 150–440)
RBC: 2.61 MIL/uL — ABNORMAL LOW (ref 4.40–5.90)
RDW: 15.2 % — AB (ref 11.5–14.5)
WBC: 12.1 10*3/uL — AB (ref 3.8–10.6)

## 2016-02-12 LAB — PREPARE RBC (CROSSMATCH)

## 2016-02-12 MED ORDER — LOSARTAN POTASSIUM 50 MG PO TABS
50.0000 mg | ORAL_TABLET | Freq: Every day | ORAL | Status: DC
  Administered 2016-02-12 – 2016-02-16 (×5): 50 mg via ORAL
  Filled 2016-02-12 (×5): qty 1

## 2016-02-12 MED ORDER — DEXTROSE 50 % IV SOLN
25.0000 mL | Freq: Once | INTRAVENOUS | Status: AC
  Administered 2016-02-12: 25 mL via INTRAVENOUS
  Filled 2016-02-12: qty 50

## 2016-02-12 MED ORDER — ALBUTEROL SULFATE (2.5 MG/3ML) 0.083% IN NEBU: 2.5000 mg | INHALATION_SOLUTION | RESPIRATORY_TRACT | Status: DC | PRN

## 2016-02-12 MED ORDER — INSULIN ASPART 100 UNIT/ML ~~LOC~~ SOLN
0.0000 [IU] | Freq: Three times a day (TID) | SUBCUTANEOUS | Status: DC
  Administered 2016-02-13: 3 [IU] via SUBCUTANEOUS
  Administered 2016-02-14 – 2016-02-15 (×2): 1 [IU] via SUBCUTANEOUS
  Filled 2016-02-12: qty 2
  Filled 2016-02-12: qty 1

## 2016-02-12 MED ORDER — LABETALOL HCL 200 MG PO TABS
100.0000 mg | ORAL_TABLET | Freq: Two times a day (BID) | ORAL | Status: DC
  Administered 2016-02-12 – 2016-02-13 (×2): 100 mg via ORAL
  Filled 2016-02-12: qty 2
  Filled 2016-02-12: qty 1

## 2016-02-12 MED ORDER — INSULIN ASPART 100 UNIT/ML ~~LOC~~ SOLN: 0.0000 [IU] | Freq: Every day | SUBCUTANEOUS | Status: DC

## 2016-02-12 MED ORDER — SODIUM CHLORIDE 0.9 % IV SOLN
10.0000 mL/h | Freq: Once | INTRAVENOUS | Status: AC
Start: 2016-02-12 — End: 2016-02-14
  Administered 2016-02-14: 15:00:00 via INTRAVENOUS

## 2016-02-12 MED ORDER — ONDANSETRON HCL 4 MG PO TABS: 4.0000 mg | ORAL_TABLET | Freq: Four times a day (QID) | ORAL | Status: DC | PRN

## 2016-02-12 MED ORDER — PANTOPRAZOLE SODIUM 40 MG IV SOLR
40.0000 mg | Freq: Two times a day (BID) | INTRAVENOUS | Status: DC
  Administered 2016-02-12 – 2016-02-16 (×7): 40 mg via INTRAVENOUS
  Filled 2016-02-12 (×7): qty 40

## 2016-02-12 MED ORDER — ONDANSETRON HCL 4 MG/2ML IJ SOLN: 4.0000 mg | Freq: Four times a day (QID) | INTRAMUSCULAR | Status: DC | PRN

## 2016-02-12 MED ORDER — FERROUS SULFATE 325 (65 FE) MG PO TABS
325.0000 mg | ORAL_TABLET | Freq: Every day | ORAL | Status: DC
  Administered 2016-02-13 – 2016-02-16 (×3): 325 mg via ORAL
  Filled 2016-02-12 (×3): qty 1

## 2016-02-12 MED ORDER — ACETAMINOPHEN 650 MG RE SUPP: 650.0000 mg | Freq: Four times a day (QID) | RECTAL | Status: DC | PRN

## 2016-02-12 MED ORDER — ACETAMINOPHEN 325 MG PO TABS: 650.0000 mg | ORAL_TABLET | Freq: Four times a day (QID) | ORAL | Status: DC | PRN

## 2016-02-12 MED ORDER — SODIUM CHLORIDE 0.9 % IV SOLN
INTRAVENOUS | Status: AC
  Administered 2016-02-12 – 2016-02-13 (×2): via INTRAVENOUS

## 2016-02-12 MED ORDER — PANTOPRAZOLE SODIUM 40 MG IV SOLR
40.0000 mg | Freq: Once | INTRAVENOUS | Status: AC
  Administered 2016-02-12: 40 mg via INTRAVENOUS
  Filled 2016-02-12: qty 40

## 2016-02-12 NOTE — ED Notes (Signed)
Per Dr.Chin pt will receive blood transfusion on the floor.

## 2016-02-12 NOTE — ED Notes (Signed)
Pt went to [facility] this morning due to feelings of fatigue and lack of focus. He was told his hemoglobin was less than 7, and he needed to go to ED for possible transfusion. Pt has not noticed any bloody or tarry stools, denies vomiting blood. Denies any illness lately. Pt alert & oriented with NAD noted.

## 2016-02-12 NOTE — ED Provider Notes (Signed)
[facility] Emergency Department Provider Note ____________________________________________  Time seen: Approximately 4:46 PM  I have reviewed the triage vital signs and the nursing notes.   HISTORY  Chief Complaint Abnormal Lab    HPI RAFEL PRINDLE is a 55 y.o. male with a history of diabetes, CK D stage IV,to psychosis who presented to his primary care doctor, Dr. Ouida Sills, this morning because he is been having fatigue and tiredness for a while but it has been especially bad for the past 3 days. He was called to go into the ER because his hemoglobin was 6. He denies feeling shortness of breath. He denies noting any black or blood in his bowel movements. He denies recent illness.  He denies taking NSAIDs or aspirin.  Past Medical History  Diagnosis Date  . Diabetes mellitus without complication ([place])   . Hyperlipemia   . History of hiatal hernia   . Anemia   . Arthritis   . Chronic kidney disease     kidney stones  . Hypertension     Hospitalized in October for elevated BP. given meds, dizziness when BP goes up  . Complication of anesthesia   . PONV (postoperative nausea and vomiting)     with epidural  . Swelling     of feet and legs    Patient Active Problem List   Diagnosis Date Noted  . GIB (gastrointestinal bleeding) 02/12/2016  . Anemia 02/12/2016  . Encephalopathy, hypertensive 08/16/2015  . Acute on chronic renal failure ([place]) 08/16/2015  . Diabetes mellitus ([place]) 08/16/2015  . Leukocytosis 08/16/2015  . Elevated troponin 08/16/2015  . 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 Cholecystectomy    . Neck surgery      cervical disc  . Achilles tendon repair Right   . Cataract extraction w/phaco Right 10/09/2015    Procedure: CATARACT EXTRACTION PHACO AND INTRAOCULAR LENS PLACEMENT (IOC);  Surgeon:  Leandrew Koyanagi, MD;  Location: [place];  Service: Ophthalmology;  Laterality: Right;  DIABETIC - oral meds    No current outpatient prescriptions on file.  Allergies Review of patient's allergies indicates no known allergies.  Family History  Problem Relation Age of Onset  . Hypertension Other     Social History Social History  Substance Use Topics  . Smoking status: Never Smoker   . Smokeless tobacco: None  . Alcohol Use: No    Review of Systems Constitutional: No fever/ Cardiovascular: Denies chest pain. Respiratory: Denies shortness of breath. Gastrointestinal: No abdominal pain.  No nausea, no vomiting.  Neurological: Negative for  focal weakness or numbness.  10-point ROS otherwise negative.  ____________________________________________   PHYSICAL EXAM:  VITAL SIGNS: ED Triage Vitals  Enc Vitals Group     BP 02/12/16 1335 148/64 mmHg     Pulse Rate 02/12/16 1335 80     Resp 02/12/16 1335 18     Temp 02/12/16 1335 98.1 F (36.7 C)     Temp Source 02/12/16 1335 Oral     SpO2 02/12/16 1335 100 %     Weight 02/12/16 1335 270 lb (122.471 kg)     Height 02/12/16 1335 6\' 1"  (1.854 m)     Head Cir --      Peak Flow --      Pain Score 02/12/16 1336 4     Pain Loc --  Pain Edu? --      Excl. in [place]? --    Constitutional: Alert and oriented. Well appearing and in no acute distress. Eyes: Conjunctivae are pale. PERRL. EOMI. Head: Atraumatic. Nose: No congestion/rhinnorhea. Mouth/Throat: Mucous membranes are moist.  Oropharynx non-erythematous. Neck: No stridor.   Cardiovascular: Normal rate, regular rhythm. Grossly normal heart sounds.  Good peripheral circulation. Respiratory: Normal respiratory effort.  No retractions. Lungs CTAB. Gastrointestinal: Soft and nontender. No distention.  gu: Small amount of black stool that is strongly heme positive Musculoskeletal: No lower extremity tenderness nor edema.   Neurologic:  Normal speech and  language. No gross focal neurologic deficits are appreciated. No gait instability. Skin:  Skin is warm, dry and intact. No rash noted. Psychiatric: Mood and affect are normal. Speech and behavior are normal.  ____________________________________________   LABS (all labs ordered are listed, but only abnormal results are displayed)  Labs Reviewed  COMPREHENSIVE METABOLIC PANEL - Abnormal; Notable for the following:    Sodium 134 (*)    CO2 21 (*)    Glucose, Bld 144 (*)    BUN 34 (*)    Creatinine, Ser 3.84 (*)    Calcium 8.2 (*)    ALT 9 (*)    GFR calc non Af Amer 17 (*)    GFR calc Af Amer 19 (*)    All other components within normal limits  CBC - Abnormal; Notable for the following:    WBC 12.1 (*)    RBC 2.61 (*)    Hemoglobin 6.0 (*)    HCT 19.5 (*)    MCV 74.6 (*)    MCH 22.9 (*)    MCHC 30.7 (*)    RDW 15.2 (*)    All other components within normal limits  GLUCOSE, CAPILLARY  GLUCOSE, CAPILLARY  BASIC METABOLIC PANEL  HEMOGLOBIN  HEMOGLOBIN  HEMOGLOBIN  POC OCCULT BLOOD, ED  TYPE AND SCREEN  PREPARE RBC (CROSSMATCH)    ____________________________________________   INITIAL IMPRESSION / ASSESSMENT AND PLAN / ED COURSE  Pertinent labs & imaging results that were available during my care of the patient were reviewed by me and considered in my medical decision making (see chart for details).  I explained risk and benefit of blood transfusion to patient. He understands and agrees with proceeding. Will admit for further evaluation of GI bleeding. He states he has never had a colonoscopy in the past. I will give him IV Protonix. ____________________________________________   FINAL CLINICAL IMPRESSION(S) / ED DIAGNOSES  Final diagnoses:  Gastrointestinal bleeding, upper      New prescriptions started this visit Current Discharge Medication List       Ponciano Ort, MD 02/13/16 [phone]

## 2016-02-12 NOTE — H&P (Signed)
[place] [facility] [place] NAME: [name]    MR#:  [id number]  DATE OF BIRTH:  December 09, 1960  DATE OF ADMISSION:  02/12/2016  PRIMARY CARE PHYSICIAN: Kirk Ruths., MD   REQUESTING/REFERRING PHYSICIAN: Ponciano Ort, MD  CHIEF COMPLAINT:   Chief Complaint  Patient presents with  . Abnormal Lab  Low hemoglobin today.  HISTORY OF PRESENT ILLNESS:  [name]  is a 55 y.o. male with a known history of Hypertension, diabetes, anemia and a CKD stage IV. The patient has had fatigue, dizziness, dyspnea and generalized weakness for several days. He went to [facility] this morning and was found low hemoglobin less than 7. He was sent to the ED for further evaluation and treatment. He denies any bloody stool but has melena. He denies any other symptoms. Hemoglobin in the ED is 6.0.  PAST MEDICAL HISTORY:   Past Medical History  Diagnosis Date  . Diabetes mellitus without complication ([place])   . Hyperlipemia   . History of hiatal hernia   . Anemia   . Arthritis   . Chronic kidney disease     kidney stones  . Hypertension     Hospitalized in October for elevated BP. given meds, dizziness when BP goes up  . Complication of anesthesia   . PONV (postoperative nausea and vomiting)     with epidural  . Swelling     of feet and legs    PAST SURGICAL HISTORY:   Past Surgical History  Procedure Laterality Date  . Cholecystectomy    . Neck surgery      cervical disc  . Achilles tendon repair Right   . Cataract extraction w/phaco Right 10/09/2015    Procedure: CATARACT EXTRACTION PHACO AND INTRAOCULAR LENS PLACEMENT (IOC);  Surgeon: Leandrew Koyanagi, MD;  Location: [place];  Service: Ophthalmology;  Laterality: Right;  DIABETIC - oral meds    SOCIAL HISTORY:   Social History  Substance Use Topics  . Smoking status: Never Smoker   . Smokeless tobacco: Not on file  . Alcohol Use: No    FAMILY HISTORY:   Family  History  Problem Relation Age of Onset  . Hypertension Other     DRUG ALLERGIES:  No Known Allergies  REVIEW OF SYSTEMS:  CONSTITUTIONAL: No fever, Dizziness and generalized weakness.  EYES: No blurred or double vision.  EARS, NOSE, AND THROAT: No tinnitus or ear pain.  RESPIRATORY: No cough, shortness of breath, wheezing or hemoptysis.  CARDIOVASCULAR: No chest pain, orthopnea, edema.  GASTROINTESTINAL: No nausea, vomiting, diarrhea or abdominal pain. No bloody stool but has melena. GENITOURINARY: No dysuria, hematuria.  ENDOCRINE: No polyuria, nocturia,  HEMATOLOGY: No anemia, easy bruising or bleeding SKIN: No rash or lesion. MUSCULOSKELETAL: No joint pain or arthritis.   NEUROLOGIC: No tingling, numbness, weakness.  PSYCHIATRY: No anxiety or depression.   MEDICATIONS AT HOME:   Prior to Admission medications   Medication Sig Start Date End Date Taking? Authorizing Provider  aspirin EC 81 MG EC tablet Take 1 tablet (81 mg total) by mouth daily. 08/16/15  Yes Theodoro Grist, MD  ferrous sulfate 325 (65 FE) MG tablet Take 325 mg by mouth daily with breakfast.   Yes Historical Provider, MD  glimepiride (AMARYL) 2 MG tablet Take 2 mg by mouth daily with breakfast.   Yes Historical Provider, MD  labetalol (NORMODYNE) 100 MG tablet Take 1 tablet (100 mg total) by mouth 2 (two) times daily. 08/16/15  Yes Rima  Ether Griffins, MD  losartan (COZAAR) 50 MG tablet Take 1 tablet (50 mg total) by mouth daily. 08/16/15  Yes Theodoro Grist, MD  sitaGLIPtin (JANUVIA) 100 MG tablet Take 100 mg by mouth daily with breakfast.    Yes Historical Provider, MD      VITAL SIGNS:  Blood pressure 171/86, pulse 79, temperature 98.1 F (36.7 C), temperature source Oral, resp. rate 15, height 6\' 1"  (1.854 m), weight 122.471 kg (270 lb), SpO2 100 %.  PHYSICAL EXAMINATION:  GENERAL:  55 y.o.-year-old patient lying in the bed with no acute distress. Morbidly obese. EYES: Pupils equal, round, reactive to light and  accommodation. No scleral icterus. Extraocular muscles intact.  HEENT: Head atraumatic, normocephalic. Oropharynx and nasopharynx clear.  NECK:  Supple, no jugular venous distention. No thyroid enlargement, no tenderness.  LUNGS: Normal breath sounds bilaterally, no wheezing, rales,rhonchi or crepitation. No use of accessory muscles of respiration.  CARDIOVASCULAR: S1, S2 normal. No murmurs, rubs, or gallops.  ABDOMEN: Soft, nontender, nondistended. Bowel sounds present. No organomegaly or mass.  EXTREMITIES: No pedal edema, cyanosis, or clubbing.  NEUROLOGIC: Cranial nerves II through XII are intact. Muscle strength 5/5 in all extremities. Sensation intact. Gait not checked.  PSYCHIATRIC: The patient is alert and oriented x 3.  SKIN: No obvious rash, lesion, or ulcer.   LABORATORY PANEL:   CBC  Recent Labs Lab 02/12/16 1343  WBC 12.1*  HGB 6.0*  HCT 19.5*  PLT 321   ------------------------------------------------------------------------------------------------------------------  Chemistries   Recent Labs Lab 02/12/16 1343  NA 134*  K 4.1  CL 106  CO2 21*  GLUCOSE 144*  BUN 34*  CREATININE 3.84*  CALCIUM 8.2*  AST 20  ALT 9*  ALKPHOS 78  BILITOT 0.4   ------------------------------------------------------------------------------------------------------------------  Cardiac Enzymes No results for input(s): TROPONINI in the last 168 hours. ------------------------------------------------------------------------------------------------------------------  RADIOLOGY:  No results found.  EKG:   Orders placed or performed during the hospital encounter of 08/15/15  . ED EKG  . ED EKG  . EKG    IMPRESSION AND PLAN:   GI bleeding. The patient will be admitted to medical floor. Continue IV Protonix twice a day, follow-up hemoglobin and GI consult for possible EGD.  Anemia due to acute blood loss secondary to GI bleeding. The patient will receive PRBC  transfusion, follow-up hemoglobin.  Acute renal failure on CKD stage IV. Start normal saline IV and follow-up BMP.  Hypertension. Continue labetalol and losartan.  Diabetes 2. Start sliding scale and hold glimepiride.   All the records are reviewed and case discussed with ED provider. Management plans discussed with the patient, family and they are in agreement.  CODE STATUS: Full code  TOTAL TIME TAKING CARE OF THIS PATIENT: 53 minutes.    Demetrios Loll M.D on 02/12/2016 at 6:35 PM  Between 7am to 6pm - Pager - [phone]  After 6pm go to [email or web address] - password [email or web address] [facility]  Office  [phone]  CC: Primary care physician; Kirk Ruths., MD

## 2016-02-13 LAB — HEMOGLOBIN
Hemoglobin: 6.3 g/dL — ABNORMAL LOW (ref 13.0–18.0)
Hemoglobin: 7.1 g/dL — ABNORMAL LOW (ref 13.0–18.0)
Hemoglobin: 7.3 g/dL — ABNORMAL LOW (ref 13.0–18.0)

## 2016-02-13 LAB — BASIC METABOLIC PANEL
ANION GAP: 5 (ref 5–15)
BUN: 32 mg/dL — ABNORMAL HIGH (ref 6–20)
CALCIUM: 8 mg/dL — AB (ref 8.9–10.3)
CO2: 21 mmol/L — ABNORMAL LOW (ref 22–32)
CREATININE: 3.75 mg/dL — AB (ref 0.61–1.24)
Chloride: 110 mmol/L (ref 101–111)
GFR, EST AFRICAN AMERICAN: 20 mL/min — AB (ref >60)
GFR, EST NON AFRICAN AMERICAN: 17 mL/min — AB (ref >60)
GLUCOSE: 78 mg/dL (ref 65–99)
Potassium: 4.1 mmol/L (ref 3.5–5.1)
Sodium: 136 mmol/L (ref 135–145)

## 2016-02-13 LAB — GLUCOSE, CAPILLARY
GLUCOSE-CAPILLARY: 76 mg/dL (ref 65–99)
GLUCOSE-CAPILLARY: 152 mg/dL — AB (ref 65–99)
Glucose-Capillary: 90 mg/dL (ref 65–99)
Glucose-Capillary: 81 mg/dL (ref 65–99)

## 2016-02-13 LAB — PREPARE RBC (CROSSMATCH)

## 2016-02-13 MED ORDER — SODIUM CHLORIDE 0.9 % IV SOLN
Freq: Once | INTRAVENOUS | Status: AC
  Administered 2016-02-13: 02:00:00 via INTRAVENOUS

## 2016-02-13 MED ORDER — SODIUM CHLORIDE 0.9 % IV SOLN: INTRAVENOUS | Status: DC

## 2016-02-13 MED ORDER — SODIUM CHLORIDE 0.9 % IV SOLN
INTRAVENOUS | Status: DC
  Administered 2016-02-14: 1000 mL via INTRAVENOUS
  Administered 2016-02-14: 06:00:00 via INTRAVENOUS

## 2016-02-13 MED ORDER — LABETALOL HCL 200 MG PO TABS
200.0000 mg | ORAL_TABLET | Freq: Two times a day (BID) | ORAL | Status: DC
  Administered 2016-02-13 – 2016-02-16 (×5): 200 mg via ORAL
  Filled 2016-02-13 (×5): qty 1

## 2016-02-13 MED ORDER — POLYETHYLENE GLYCOL 3350 17 GM/SCOOP PO POWD
1.0000 | Freq: Once | ORAL | Status: AC
  Administered 2016-02-14: 255 g via ORAL
  Filled 2016-02-13: qty 255

## 2016-02-13 MED ORDER — POLYETHYLENE GLYCOL 3350 17 GM/SCOOP PO POWD
1.0000 | Freq: Once | ORAL | Status: AC
  Administered 2016-02-13: 255 g via ORAL
  Filled 2016-02-13: qty 255

## 2016-02-13 NOTE — Progress Notes (Addendum)
Patient ID: [name], male   DOB: [birth year], 55 y.o.   MRN: [id number] [place] [facility] [place] NAME: [name]    MR#:  [id number]  DATE OF BIRTH:  [birth year]  SUBJECTIVE:  Came in with hgb of 6.0. Feeling dizzy and fatigue  REVIEW OF SYSTEMS:   Review of Systems  Constitutional: Positive for malaise/fatigue. Negative for fever, chills and weight loss.  HENT: Negative for ear discharge, ear pain and nosebleeds.   Eyes: Negative for blurred vision, pain and discharge.  Respiratory: Negative for sputum production, shortness of breath, wheezing and stridor.   Cardiovascular: Negative for chest pain, palpitations, orthopnea and PND.  Gastrointestinal: Negative for nausea, vomiting, abdominal pain and diarrhea.  Genitourinary: Negative for urgency and frequency.  Musculoskeletal: Negative for back pain and joint pain.  Neurological: Positive for weakness. Negative for sensory change, speech change and focal weakness.  Psychiatric/Behavioral: Negative for depression and hallucinations. The patient is not nervous/anxious.   All other systems reviewed and are negative.  Tolerating Diet:cld Tolerating PT: none  DRUG ALLERGIES:  No Known Allergies  VITALS:  Blood pressure 171/77, pulse 73, temperature 98.3 F (36.8 C), temperature source Oral, resp. rate 17, height 6\' 1"  (1.854 m), weight 122.471 kg (270 lb), SpO2 98 %.  PHYSICAL EXAMINATION:   Physical Exam  GENERAL:  55 y.o.-year-old patient lying in the bed with no acute distress.  EYES: Pupils equal, round, reactive to light and accommodation. No scleral icterus. Extraocular muscles intact.  HEENT: Head atraumatic, normocephalic. Oropharynx and nasopharynx clear.  NECK:  Supple, no jugular venous distention. No thyroid enlargement, no tenderness.  LUNGS: Normal breath sounds bilaterally, no wheezing, rales, rhonchi. No use of accessory muscles of respiration.   CARDIOVASCULAR: S1, S2 normal. No murmurs, rubs, or gallops.  ABDOMEN: Soft, nontender, nondistended. Bowel sounds present. No organomegaly or mass.  EXTREMITIES: No cyanosis, clubbing or edema b/l.    NEUROLOGIC: Cranial nerves II through XII are intact. No focal Motor or sensory deficits b/l.   PSYCHIATRIC:  patient is alert and oriented x 3.  SKIN: No obvious rash, lesion, or ulcer.   LABORATORY PANEL:  CBC  Recent Labs Lab 02/12/16 1343  02/13/16 1021  WBC 12.1*  --   --   HGB 6.0*  < > 7.3*  HCT 19.5*  --   --   PLT 321  --   --   < > = values in this interval not displayed.  Chemistries   Recent Labs Lab 02/12/16 1343 02/13/16 0546  NA 134* 136  K 4.1 4.1  CL 106 110  CO2 21* 21*  GLUCOSE 144* 78  BUN 34* 32*  CREATININE 3.84* 3.75*  CALCIUM 8.2* 8.0*  AST 20  --   ALT 9*  --   ALKPHOS 78  --   BILITOT 0.4  --    Cardiac Enzymes No results for input(s): TROPONINI in the last 168 hours. RADIOLOGY:  No results found. ASSESSMENT AND PLAN:  [name] is a 55 y.o. male with a known history of Hypertension, diabetes, anemia and a CKD stage IV. The patient has had fatigue, dizziness, dyspnea and generalized weakness for several days  1. Acute on chronic anemia etio unlcear -baseline hgb 10.g6 (2016) -IV PP gtt -denies NSAIDS use, melena or vomiting -came in with 6.0---2 units PRBC--7.3 -GI consult pending -EGD in the past showed HH -on iron pills  2.DM-2 ssi and po meds  3.HTN Cont  home meds  4.DVT prophylaxis TED/SCDS  Case discussed with Care Management/Social Worker. Management plans discussed with the patient, family and they are in agreement.  CODE STATUS: Full  DVT Prophylaxis: SCD/TEDS  TOTAL TIME TAKING CARE OF THIS PATIENT: 30 minutes.  >50% time spent on counselling and coordination of care  POSSIBLE D/C IN 1-2 DAYS, DEPENDING ON CLINICAL CONDITION.  Note: This dictation was prepared with Dragon dictation along with smaller  phrase technology. Any transcriptional errors that result from this process are unintentional.  Eymi Lipuma M.D on 02/13/2016 at 2:17 PM  Between 7am to 6pm - Pager - [phone]  After 6pm go to [email or web address] - password [email or web address] [facility]  Office  [phone]  CC: Primary care physician; Kirk Ruths., MD

## 2016-02-13 NOTE — Progress Notes (Signed)
Spoke with on call doctor, Dr. Jannifer Franklin. Pts HgB is 6.3 after receiving one unit of RBCs. Dr. Jannifer Franklin ordered for pt to received another unit.

## 2016-02-13 NOTE — Consult Note (Signed)
GI Inpatient Consult Note  Reason for Consult: GI bleed & anemia   Attending Requesting Consult: Dr. Bridgett Larsson  History of Present Illness: [name] is a 55 y.o. male with a history of chronic IDA (on ferrous sulfate 325mg  daily), CKD stage IV, DM II, HTN, and HLD admitted with a GI bleed and symptomatic anemia.  Patient reports several days of increase fatigue, weakness, dyspnea, and dizziness.  Stool may have been black a few days ago per patient.  No frank rectal bleeding or hematochezia.    He presented to his PCP, Dr. Ouida [name], to discuss these complaints.  Hgb returned at 6 with MCV 79.9 (08/16/15 - Hgb 9.8, MCV 78.8), so ED evaluation was encouraged.  Labs: Hgb 6.0, MCV 74.6; Na 134, BUN 34, Cr 3.84; FOBT pending  Patient received 1 unit PRBCs, which yielded Hgb 6.3.  After a second unit, Hgb increased to 7.1.  It has remained stable at 7.1 on recheck.  He was also started on IV Protonix 40mg  q 12hrs.  Today, [name] reports great improvement in fatigue and weakness.  He denies further dyspnea and dizziness.  No epigastric pain, GERD symptoms, dysphagia, or nausea/vomiting.  He has not had a BM since admission.  Until these symptoms began, his appetite was good.  Weight is stable.  He denies NSAIDs use and EtOH.  Also no FHx of CCA or colon polyps.  Of note, patient was evaluated by Dawson Bills, NP at [facility] for IDA in 12/2014.  EGD and colonoscopy were recommended, but patient failed to schedule these procedures.  Past Medical History:  Past Medical History  Diagnosis Date  . Diabetes mellitus without complication ([place])   . Hyperlipemia   . History of hiatal hernia   . Anemia   . Arthritis   . Chronic kidney disease     kidney stones  . Hypertension     Hospitalized in October for elevated BP. given meds, dizziness when BP goes up  . Complication of anesthesia   . PONV (postoperative nausea and vomiting)     with epidural  . Swelling     of feet and legs    Problem  List: Patient Active Problem List   Diagnosis Date Noted  . GIB (gastrointestinal bleeding) 02/12/2016  . Anemia 02/12/2016  . Encephalopathy, hypertensive 08/16/2015  . Acute on chronic renal failure ([place]) 08/16/2015  . Diabetes mellitus ([place]) 08/16/2015  . Leukocytosis 08/16/2015  . Elevated troponin 08/16/2015  . 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 Cholecystectomy    . Neck surgery      cervical disc  . Achilles tendon repair Right   . Cataract extraction w/phaco Right 10/09/2015    Procedure: CATARACT EXTRACTION PHACO AND INTRAOCULAR LENS PLACEMENT (IOC);  Surgeon: Leandrew Koyanagi, MD;  Location: [place];  Service: Ophthalmology;  Laterality: Right;  DIABETIC - oral meds    Allergies: No Known Allergies  Home Medications: Prescriptions prior to admission  Medication Sig Dispense Refill Last Dose  . aspirin EC 81 MG EC tablet Take 1 tablet (81 mg total) by mouth daily. 30 tablet 6 02/12/2016 at 0730  . ferrous sulfate 325 (65 FE) MG tablet Take 325 mg by mouth daily with breakfast.   02/12/2016 at Unknown time  . glimepiride (AMARYL) 2 MG tablet Take 2 mg by mouth daily  with breakfast.   02/12/2016 at Unknown time  . labetalol (NORMODYNE) 100 MG tablet Take 1 tablet (100 mg total) by mouth 2 (two) times daily. 60 tablet 6 02/12/2016 at 0730  . losartan (COZAAR) 50 MG tablet Take 1 tablet (50 mg total) by mouth daily. 30 tablet 6 02/12/2016 at Unknown time  . sitaGLIPtin (JANUVIA) 100 MG tablet Take 100 mg by mouth daily with breakfast.    02/12/2016 at Unknown time   Home medication reconciliation was completed with the patient.   Scheduled Inpatient Medications:   . sodium chloride  10 mL/hr Intravenous Once  . ferrous sulfate  325 mg Oral Q breakfast  . insulin aspart  0-5 Units Subcutaneous QHS  . insulin  aspart  0-9 Units Subcutaneous TID WC  . labetalol  100 mg Oral BID  . losartan  50 mg Oral Daily  . pantoprazole (PROTONIX) IV  40 mg Intravenous Q12H    Continuous Inpatient Infusions:   . sodium chloride 75 mL/hr at 02/12/16 2325    PRN Inpatient Medications:  acetaminophen **OR** acetaminophen, albuterol, ondansetron **OR** ondansetron (ZOFRAN) IV  Family History: family history includes Hypertension in his other.    Social History:   reports that he has never smoked. He does not have any smokeless tobacco history on file. He reports that he does not drink alcohol.   Review of Systems: Constitutional: Weight is stable.  Eyes: No changes in vision. ENT: No oral lesions, sore throat.  GI: see HPI.  Heme/Lymph: No easy bruising.  CV: No chest pain.  GU: No hematuria.  Integumentary: No rashes.  Neuro: No headaches.  Psych: No depression/anxiety.  Endocrine: No heat/cold intolerance.  Allergic/Immunologic: No urticaria.  Resp: No cough, SOB.  Musculoskeletal: No joint swelling.    Physical Examination: BP 143/65 mmHg  Pulse 74  Temp(Src) 98.1 F (36.7 C) (Oral)  Resp 16  Ht 6\' 1"  (1.854 m)  Wt 122.471 kg (270 lb)  BMI 35.63 kg/m2  SpO2 100% Gen: NAD, alert and oriented x 4, sitting up in bed eating clear liquids HEENT: PEERLA, EOMI, Neck: supple, no JVD or thyromegaly Chest: CTA bilaterally, no wheezes, crackles, or other adventitious sounds CV: RRR, no m/g/c/r Abd: soft, NT, ND, +BS in all four quadrants; no HSM, guarding, ridigity, or rebound tenderness Ext: no edema, well perfused with 2+ pulses, Skin: no rash or lesions noted Lymph: no LAD  Data: Lab Results  Component Value Date   WBC 12.1* 02/12/2016   HGB 7.3* 02/13/2016   HCT 19.5* 02/12/2016   MCV 74.6* 02/12/2016   PLT 321 02/12/2016    Recent Labs Lab 02/13/16 0010 02/13/16 0546 02/13/16 1021  HGB 6.3* 7.1* 7.3*   Lab Results  Component Value Date   NA 136 02/13/2016   K 4.1  02/13/2016   CL 110 02/13/2016   CO2 21* 02/13/2016   BUN 32* 02/13/2016   CREATININE 3.75* 02/13/2016   Lab Results  Component Value Date   ALT 9* 02/12/2016   AST 20 02/12/2016   ALKPHOS 78 02/12/2016   BILITOT 0.4 02/12/2016   No results for input(s): APTT, INR, PTT in the last 168 hours.   Assessment/Plan: Mr. Mcnell is a 55 y.o. male with a history of chronic IDA (on ferrous sulfate 325mg  daily), CKD stage IV, DM II, HTN, and HLD admitted with a GI bleed and symptomatic anemia.  He reports several days of increased weakness, fatigue, dyspnea, and dizziness.  Also one episode of possible melena a  few days ago. Hgb per PCP and in the ED was 6 - this represents a 3 point drop from 08/2015.  After to units, Hgb has remained stable at 7.1.  Anemia symptoms are much improved today.  Patient does not recall has last EGD, but has never had a colonoscopy.  Given his continued trouble with IDA, a colonoscopy and EGD are recommended to exclude malignancy.  Further recs regarding procedures per Dr. Rayann Heman.  Recommendations: - Plan for EGD and colonoscopy tomorrow - Monitor Hgb, transfuse if <7  Thank you for the consult. We will follow along with you. Please call with questions or concerns.  Lavera Guise, PA-C [facility] Phone: [phone] Pager: [phone]

## 2016-02-14 ENCOUNTER — Encounter: Payer: Self-pay | Admitting: *Deleted

## 2016-02-14 ENCOUNTER — Encounter
Admission: EM | Disposition: A | Discharge status: Home / Self Care | Payer: Self-pay | Source: Home / Self Care | Attending: Internal Medicine

## 2016-02-14 ENCOUNTER — Inpatient Hospital Stay: Payer: BLUE CROSS/BLUE SHIELD | Admitting: Anesthesiology

## 2016-02-14 ENCOUNTER — Inpatient Hospital Stay: Payer: BLUE CROSS/BLUE SHIELD

## 2016-02-14 HISTORY — PX: COLONOSCOPY WITH PROPOFOL: SHX5780

## 2016-02-14 HISTORY — PX: ESOPHAGOGASTRODUODENOSCOPY (EGD) WITH PROPOFOL: SHX5813

## 2016-02-14 LAB — GLUCOSE, CAPILLARY
Glucose-Capillary: 122 mg/dL — ABNORMAL HIGH (ref 65–99)
Glucose-Capillary: 93 mg/dL (ref 65–99)
Glucose-Capillary: 110 mg/dL — ABNORMAL HIGH (ref 65–99)

## 2016-02-14 LAB — HEMOGLOBIN: Hemoglobin: 7.2 g/dL — ABNORMAL LOW (ref 13.0–18.0)

## 2016-02-14 SURGERY — COLONOSCOPY WITH PROPOFOL
Anesthesia: General

## 2016-02-14 SURGERY — ESOPHAGOGASTRODUODENOSCOPY (EGD) WITH PROPOFOL
Anesthesia: General

## 2016-02-14 MED ORDER — DIATRIZOATE MEGLUMINE & SODIUM 66-10 % PO SOLN
15.0000 mL | ORAL | Status: AC
  Administered 2016-02-14 (×2): 15 mL via ORAL
  Filled 2016-02-14: qty 30

## 2016-02-14 MED ORDER — SODIUM CHLORIDE 0.9 % IV SOLN
INTRAVENOUS | Status: DC
  Administered 2016-02-14: 1000 mL via INTRAVENOUS

## 2016-02-14 MED ORDER — PROPOFOL 500 MG/50ML IV EMUL
INTRAVENOUS | Status: DC | PRN
  Administered 2016-02-14: 140 ug/kg/min via INTRAVENOUS

## 2016-02-14 MED ORDER — PROPOFOL 10 MG/ML IV BOLUS
INTRAVENOUS | Status: DC | PRN
  Administered 2016-02-14: 100 mg via INTRAVENOUS

## 2016-02-14 NOTE — Op Note (Signed)
[facility] Gastroenterology Patient Name: [name] Procedure Date: 02/14/2016 3:39 PM MRN: [id number] Account #: [id number] Date of Birth: 1961/05/14 Admit Type: Inpatient Age: 55 Room: [facility] ENDO ROOM 3 Gender: Male Note Status: Finalized Procedure:            Colonoscopy Indications:          This is the patient's first colonoscopy, Iron                        deficiency anemia Patient Profile:      This is a 55 year old male. Providers:            Gerrit Heck. Rayann Heman, MD Referring MD:         Hart Rochester Posey Pronto, MD (Referring MD) Medicines:            Propofol per Anesthesia Complications:        No immediate complications. Procedure:            Pre-Anesthesia Assessment:                       - Prior to the procedure, a History and Physical was                        performed, and patient medications, allergies and                        sensitivities were reviewed. The patient's tolerance of                        previous anesthesia was reviewed.                       After obtaining informed consent, the colonoscope was                        passed under direct vision. Throughout the procedure,                        the patient's blood pressure, pulse, and oxygen                        saturations were monitored continuously. The Olympus                        CF-H180AL colonoscope ( S#: [id number] ) was introduced                        through the anus and advanced to the the cecum,                        identified by appendiceal orifice and ileocecal valve.                        The colonoscopy was performed without difficulty. The                        patient tolerated the procedure well. The quality of                        the bowel preparation was excellent. Findings:  The perianal and digital rectal examinations were normal.      A fungating partially obstructing mass was found in the proximal       ascending colon starting at IC valve. The  mass was circumferential. The       mass measured 7 cm in length. No bleeding was present. Biopsies were       taken with a cold forceps for histology.      A polypoid non-obstructing mass was found in the ascending colon. The       mass was non-circumferential. In addition, its diameter measured thirty       mm. No bleeding was present. Biopsies were taken with a cold forceps for       histology. Area was tattooed with an injection of 5 mL of [place] ink.      A 7 mm polyp was found in the proximal transverse colon. The polyp was       sessile. The polyp was removed with a hot snare. Resection and retrieval       were complete.      Internal hemorrhoids were found during retroflexion. The hemorrhoids       were Grade I (internal hemorrhoids that do not prolapse).      The exam was otherwise without abnormality. 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 Biopsied. NOT REMOVED.                       - Likely malignant tumor in the ascending colon just                        distal to the proximal mass. Biopsied. Tattooed just                        distally NOT REMOVED.                       - The two masses were adjacent in the ascending colon.                        First mass started at IC valve and extended distally                        about 7 cm. The second mass started just distal to the                        first mass.                       Tatoos placed at distal end of distal mass.                       - One 7 mm polyp in the proximal transverse colon,  removed with a hot snare. Resected and retrieved.                       - Internal hemorrhoids.                       - The examination was otherwise normal. Recommendation:       - Observe patient in GI recovery unit.                       - Resume regular  diet.                       - Continue present medications.                       - Await pathology results.                       - Refer to a SURGEON for R hemi-colectomy of two                        ascending colon masses.                       - The findings and recommendations were discussed with                        the patient. Procedure Code(s):    --- Professional ---                       [phone], Colonoscopy, flexible; with removal of tumor(s),                        polyp(s), or other lesion(s) by snare technique                       45380, 57, Colonoscopy, flexible; with biopsy, single                        or multiple                       45381, Colonoscopy, flexible; with directed submucosal                        injection(s), any substance Diagnosis Code(s):    --- Professional ---                       C18.2, Malignant neoplasm of ascending colon                       D49.0, Neoplasm of unspecified behavior of digestive                        system                       D12.3, Benign neoplasm of transverse colon (hepatic                        flexure or splenic flexure)  K64.0, First degree hemorrhoids                       D50.9, Iron deficiency anemia, unspecified CPT copyright 2016 American Medical Association. All rights reserved. The codes documented in this report are preliminary and upon coder review may  be revised to meet current compliance requirements. Mellody Life, MD 02/14/2016 4:15:19 PM This report has been signed electronically. Number of Addenda: 0 Note Initiated On: 02/14/2016 3:39 PM Scope Withdrawal Time: 0 hours 13 minutes 57 seconds  Total Procedure Duration: 0 hours 23 minutes 33 seconds       [facility]

## 2016-02-14 NOTE — Anesthesia Postprocedure Evaluation (Signed)
Anesthesia Post Note  Patient: [name]  Procedure(s) Performed: Procedure(s) (LRB): COLONOSCOPY WITH PROPOFOL (N/A) ESOPHAGOGASTRODUODENOSCOPY (EGD) WITH PROPOFOL (N/A)  Patient location during evaluation: Endoscopy Anesthesia Type: General Level of consciousness: awake and alert Pain management: pain level controlled Vital Signs Assessment: post-procedure vital signs reviewed and stable Respiratory status: spontaneous breathing, nonlabored ventilation, respiratory function stable and patient connected to nasal cannula oxygen Cardiovascular status: blood pressure returned to baseline and stable Postop Assessment: no signs of nausea or vomiting Anesthetic complications: no    Last Vitals:  Filed Vitals:   02/14/16 1708 02/14/16 1840  BP: 182/80 169/70  Pulse: 72   Temp: 36.4 C   Resp: 17     Last Pain:  Filed Vitals:   02/14/16 1841  PainSc: [place]

## 2016-02-14 NOTE — H&P (Signed)
Primary Care Physician:  Kirk Ruths., MD  Pre-Procedure History & Physical: HPI:  [name] is a 55 y.o. male is here for an endoscopy / colonoscopy   Past Medical History  Diagnosis Date  . Diabetes mellitus without complication ([place])   . Hyperlipemia   . History of hiatal hernia   . Anemia   . Arthritis   . Chronic kidney disease     kidney stones  . Hypertension     Hospitalized in October for elevated BP. given meds, dizziness when BP goes up  . Complication of anesthesia   . PONV (postoperative nausea and vomiting)     with epidural  . Swelling     of feet and legs    Past Surgical History  Procedure Laterality Date  . Cholecystectomy    . Neck surgery      cervical disc  . Achilles tendon repair Right   . Cataract extraction w/phaco Right 10/09/2015    Procedure: CATARACT EXTRACTION PHACO AND INTRAOCULAR LENS PLACEMENT (IOC);  Surgeon: Leandrew Koyanagi, MD;  Location: [place];  Service: Ophthalmology;  Laterality: Right;  DIABETIC - oral meds    Prior to Admission medications   Medication Sig Start Date End Date Taking? Authorizing Provider  aspirin EC 81 MG EC tablet Take 1 tablet (81 mg total) by mouth daily. 08/16/15  Yes Theodoro Grist, MD  ferrous sulfate 325 (65 FE) MG tablet Take 325 mg by mouth daily with breakfast.   Yes Historical Provider, MD  glimepiride (AMARYL) 2 MG tablet Take 2 mg by mouth daily with breakfast.   Yes Historical Provider, MD  labetalol (NORMODYNE) 100 MG tablet Take 1 tablet (100 mg total) by mouth 2 (two) times daily. 08/16/15  Yes Theodoro Grist, MD  losartan (COZAAR) 50 MG tablet Take 1 tablet (50 mg total) by mouth daily. 08/16/15  Yes Theodoro Grist, MD  sitaGLIPtin (JANUVIA) 100 MG tablet Take 100 mg by mouth daily with breakfast.    Yes Historical Provider, MD    Allergies as of 02/12/2016  . (No Known Allergies)    Family History  Problem Relation Age of Onset  . Hypertension Other     Social  History   Social History  . Marital Status: Single    Spouse Name: N/A  . Number of Children: N/A  . Years of Education: N/A   Occupational History  . Not on file.   Social History Main Topics  . Smoking status: Never Smoker   . Smokeless tobacco: Not on file  . Alcohol Use: No  . Drug Use: Not on file  . Sexual Activity: Not on file   Other Topics Concern  . Not on file   Social History Narrative     Physical Exam: BP 165/77 mmHg  Pulse 70  Temp(Src) 97.1 F (36.2 C) (Tympanic)  Resp 18  Ht 6\' 1"  (1.854 m)  Wt 122.471 kg (270 lb)  BMI 35.63 kg/m2  SpO2 100% General:   Alert,  pleasant and cooperative in NAD Head:  Normocephalic and atraumatic. Neck:  Supple; no masses or thyromegaly. Lungs:  Clear throughout to auscultation.    Heart:  Regular rate and rhythm. Abdomen:  Soft, nontender and nondistended. Normal bowel sounds, without guarding, and without rebound.   Neurologic:  Alert and  oriented x4;  grossly normal neurologically.  Impression/Plan: [name] is here for an endoscopy/colonoscopy to be performed for IDA  Risks, benefits, limitations, and alternatives regarding  Endoscopy/colonoscopy have  been reviewed with the patient.  Questions have been answered.  All parties agreeable.   Josefine Class, MD  02/14/2016, 3:28 PM

## 2016-02-14 NOTE — Progress Notes (Signed)
Patient ID: [name], male   DOB: 07-May-1961, 55 y.o.   MRN: [id number] Results of EGD and colonsocpy noted. Spoke with Dr Dahlia Byes from surgery to see pt. Will give FLD tonite

## 2016-02-14 NOTE — Progress Notes (Signed)
55 yo asked to see in consultation for right colon near obstructing mass. Pt w significant anemia requiring transfusion. CKD not on HD. Dm w reasonable CV reserve.  Plan Transfuse to hb greater than 8 Hold ASA CT A/P r/o met dz ( limited due to IV contrast) He wishes to go home and then come back for elective right hemicolectomy. Pending path.  Would keep him until hb is better and until we have CT scan Full consult to follow

## 2016-02-14 NOTE — Anesthesia Preprocedure Evaluation (Signed)
Anesthesia Evaluation  Patient identified by MRN, date of birth, ID band Patient awake    Reviewed: Allergy & Precautions, H&P , NPO status , Patient's Chart, lab work & pertinent test results  History of Anesthesia Complications (+) PONV and history of anesthetic complications  Airway Mallampati: III  TM Distance: >3 FB Neck ROM: limited    Dental  (+) Poor Dentition, Chipped, Missing   Pulmonary neg shortness of breath, sleep apnea ,    Pulmonary exam normal breath sounds clear to auscultation       Cardiovascular Exercise Tolerance: Good hypertension, (-) angina+ Peripheral Vascular Disease  (-) Past MI Normal cardiovascular exam Rhythm:regular Rate:Normal     Neuro/Psych negative neurological ROS  negative psych ROS   GI/Hepatic Neg liver ROS, hiatal hernia, GERD  Controlled,  Endo/Other  diabetes, Type 2  Renal/GU CRFRenal disease  negative genitourinary   Musculoskeletal negative musculoskeletal ROS (+) Arthritis ,   Abdominal   Peds negative pediatric ROS (+)  Hematology negative hematology ROS (+)   Anesthesia Other Findings Past Medical History:   Diabetes mellitus without complication (H[facility])                 Hyperlipemia                                                 History of hiatal hernia                                     Anemia                                                       Arthritis                                                    Chronic kidney disease                                         Comment:kidney stones   Hypertension                                                   Comment:Hospitalized in October for elevated BP. given               meds, dizziness when BP goes up   Complication of anesthesia                                   PONV (postoperative nausea and vomiting)                       Comment:with epidural   Swelling  Comment:of feet and legs  Past Surgical History:   CHOLECYSTECTOMY                                               NECK SURGERY                                                    Comment:cervical disc   ACHILLES TENDON REPAIR                          Right              CATARACT EXTRACTION W/PHACO                     Right 10/09/2015     Comment:Procedure: CATARACT EXTRACTION PHACO AND               INTRAOCULAR LENS PLACEMENT (IOC);  Surgeon:               Leandrew Koyanagi, MD;  Location: [place];  Service: Ophthalmology;                Laterality: Right;  DIABETIC - oral meds  BMI    Body Mass Index   35.62 kg/m 2      Reproductive/Obstetrics negative OB ROS                             Anesthesia Physical Anesthesia Plan  ASA: III  Anesthesia Plan: General   Post-op Pain Management:    Induction:   Airway Management Planned:   Additional Equipment:   Intra-op Plan:   Post-operative Plan:   Informed Consent: I have reviewed the patients History and Physical, chart, labs and discussed the procedure including the risks, benefits and alternatives for the proposed anesthesia with the patient or authorized representative who has indicated his/her understanding and acceptance.   Dental Advisory Given  Plan Discussed with: Anesthesiologist, CRNA and Surgeon  Anesthesia Plan Comments:         Anesthesia Quick Evaluation

## 2016-02-14 NOTE — Progress Notes (Signed)
Patient ID: [name], male   DOB: 12-Sep-1961, 55 y.o.   MRN: [id number] [place] [facility] [place] NAME: [name]    MR#:  [id number]  DATE OF BIRTH:  [birth year]  SUBJECTIVE:  Came in with hgb of 6.0. Feeling dizzy and fatigue No complaint today. Completed prep for colonoscopy  REVIEW OF SYSTEMS:   Review of Systems  Constitutional: Positive for malaise/fatigue. Negative for fever, chills and weight loss.  HENT: Negative for ear discharge, ear pain and nosebleeds.   Eyes: Negative for blurred vision, pain and discharge.  Respiratory: Negative for sputum production, shortness of breath, wheezing and stridor.   Cardiovascular: Negative for chest pain, palpitations, orthopnea and PND.  Gastrointestinal: Negative for nausea, vomiting, abdominal pain and diarrhea.  Genitourinary: Negative for urgency and frequency.  Musculoskeletal: Negative for back pain and joint pain.  Neurological: Positive for weakness. Negative for sensory change, speech change and focal weakness.  Psychiatric/Behavioral: Negative for depression and hallucinations. The patient is not nervous/anxious.   All other systems reviewed and are negative.  Tolerating Diet:npo Tolerating PT: none  DRUG ALLERGIES:  No Known Allergies  VITALS:  Blood pressure 135/56, pulse 72, temperature 97.8 F (36.6 C), temperature source Oral, resp. rate 18, height 6\' 1"  (1.854 m), weight 122.471 kg (270 lb), SpO2 98 %.  PHYSICAL EXAMINATION:   Physical Exam  GENERAL:  55 y.o.-year-old patient lying in the bed with no acute distress.  EYES: Pupils equal, round, reactive to light and accommodation. No scleral icterus. Extraocular muscles intact.  HEENT: Head atraumatic, normocephalic. Oropharynx and nasopharynx clear.  NECK:  Supple, no jugular venous distention. No thyroid enlargement, no tenderness.  LUNGS: Normal breath sounds bilaterally, no wheezing, rales, rhonchi. No use  of accessory muscles of respiration.  CARDIOVASCULAR: S1, S2 normal. No murmurs, rubs, or gallops.  ABDOMEN: Soft, nontender, nondistended. Bowel sounds present. No organomegaly or mass.  EXTREMITIES: No cyanosis, clubbing or edema b/l.    NEUROLOGIC: Cranial nerves II through XII are intact. No focal Motor or sensory deficits b/l.   PSYCHIATRIC:  patient is alert and oriented x 3.  SKIN: No obvious rash, lesion, or ulcer.   LABORATORY PANEL:  CBC  Recent Labs Lab 02/12/16 1343  02/13/16 1021  WBC 12.1*  --   --   HGB 6.0*  < > 7.3*  HCT 19.5*  --   --   PLT 321  --   --   < > = values in this interval not displayed.  Chemistries   Recent Labs Lab 02/12/16 1343 02/13/16 0546  NA 134* 136  K 4.1 4.1  CL 106 110  CO2 21* 21*  GLUCOSE 144* 78  BUN 34* 32*  CREATININE 3.84* 3.75*  CALCIUM 8.2* 8.0*  AST 20  --   ALT 9*  --   ALKPHOS 78  --   BILITOT 0.4  --    Cardiac Enzymes No results for input(s): TROPONINI in the last 168 hours. RADIOLOGY:  No results found. ASSESSMENT AND PLAN:  [name] is a 55 y.o. male with a known history of Hypertension, diabetes, anemia and a CKD stage IV. The patient has had fatigue, dizziness, dyspnea and generalized weakness for several days  1. Acute on chronic anemia etio unlcear -baseline hgb 10.g6 (2016) -denies NSAIDS use, melena or vomiting -came in with 6.0---2 units PRBC--7.3 -GI consult noted. EGD and colonsocopy today -EGD in the past showed HH -on iron pills  2.DM-2 ssi and po meds  3.HTN Cont home meds  4.DVT prophylaxis TED/SCDS  Case discussed with Care Management/Social Worker. Management plans discussed with the patient, family and they are in agreement.  CODE STATUS: Full  DVT Prophylaxis: SCD/TEDS  TOTAL TIME TAKING CARE OF THIS PATIENT: 30 minutes.  >50% time spent on counselling and coordination of care  POSSIBLE D/C IN 1 DAY DEPENDING ON CLINICAL CONDITION.  Note: This dictation was  prepared with Dragon dictation along with smaller phrase technology. Any transcriptional errors that result from this process are unintentional.  Harden Bramer M.D on 02/14/2016 at 12:03 PM  Between 7am to 6pm - Pager - [phone]  After 6pm go to [email or web address] - password [email or web address] [facility]  Office  [phone]  CC: Primary care physician; Kirk Ruths., MD

## 2016-02-14 NOTE — Op Note (Signed)
[facility] Gastroenterology Patient Name: [name] Procedure Date: 02/14/2016 3:29 PM MRN: [id number] Account #: [id number] Date of Birth: 1960-12-25 Admit Type: Inpatient Age: 55 Room: [facility] ENDO ROOM 3 Gender: Male Note Status: Finalized Procedure:            Upper GI endoscopy Indications:          Iron deficiency anemia Patient Profile:      This is a 55 year old male. Providers:            Gerrit Heck. Rayann Heman, MD Referring MD:         Hart Rochester Posey Pronto, MD (Referring MD) Medicines:            Propofol per Anesthesia Complications:        No immediate complications. Procedure:            Pre-Anesthesia Assessment:                       - Prior to the procedure, a History and Physical was                        performed, and patient medications, allergies and                        sensitivities were reviewed. The patient's tolerance of                        previous anesthesia was reviewed.                       After obtaining informed consent, the endoscope was                        passed under direct vision. Throughout the procedure,                        the patient's blood pressure, pulse, and oxygen                        saturations were monitored continuously. The Endoscope                        was introduced through the mouth, and advanced to the                        second part of duodenum. The upper GI endoscopy was                        accomplished without difficulty. The patient tolerated                        the procedure well. Findings:      LA Grade C (one or more mucosal breaks continuous between tops of 2 or       more mucosal folds, less than 75% circumference) esophagitis with no       bleeding was found in the lower third of the esophagus. Biopsies were       taken with a cold forceps for histology.      The stomach was normal.      The examined duodenum was normal. Impression:           -  LA Grade C reflux esophagitis.  Biopsied.                       - Normal stomach.                       - Normal examined duodenum. Recommendation:       - Use Prilosec (omeprazole) 40 mg PO daily.                       - Await pathology results.                       - The findings and recommendations were discussed with                        the patient.                       - The findings and recommendations were discussed with                        the patient's family.                       - Perform a colonoscopy today. Procedure Code(s):    --- Professional ---                       [phone], Esophagogastroduodenoscopy, flexible, transoral;                        with biopsy, single or multiple Diagnosis Code(s):    --- Professional ---                       K21.0, Gastro-esophageal reflux disease with esophagitis                       D50.9, Iron deficiency anemia, unspecified CPT copyright 2016 American Medical Association. All rights reserved. The codes documented in this report are preliminary and upon coder review may  be revised to meet current compliance requirements. Mellody Life, MD 02/14/2016 3:39:47 PM This report has been signed electronically. Number of Addenda: 0 Note Initiated On: 02/14/2016 3:29 PM      [facility]

## 2016-02-14 NOTE — Transfer of Care (Signed)
Immediate Anesthesia Transfer of Care Note  Patient: [name]  Procedure(s) Performed: Procedure(s): COLONOSCOPY WITH PROPOFOL (N/A) ESOPHAGOGASTRODUODENOSCOPY (EGD) WITH PROPOFOL (N/A)  Patient Location: PACU  Anesthesia Type:General  Level of Consciousness: awake, alert  and oriented  Airway & Oxygen Therapy: Patient Spontanous Breathing and Patient connected to nasal cannula oxygen  Post-op Assessment: Report given to RN  Post vital signs: Reviewed and stable  Last Vitals:  Filed Vitals:   02/14/16 1311 02/14/16 1459  BP: 158/86 165/77  Pulse: 71 70  Temp: 36.3 C 36.2 C  Resp: 16 18    Complications: No apparent anesthesia complications

## 2016-02-15 DIAGNOSIS — K6389 Other specified diseases of intestine: Secondary | ICD-10-CM | POA: Insufficient documentation

## 2016-02-15 LAB — CBC WITH DIFFERENTIAL/PLATELET
BASOS PCT: 1 %
Basophils Absolute: 0.1 10*3/uL (ref 0–0.1)
EOS ABS: 0.4 10*3/uL (ref 0–0.7)
Eosinophils Relative: 4 %
HCT: 21.7 % — ABNORMAL LOW (ref 40.0–52.0)
HEMOGLOBIN: 6.9 g/dL — AB (ref 13.0–18.0)
LYMPHS ABS: 0.8 10*3/uL — AB (ref 1.0–3.6)
LYMPHS PCT: 9 %
MCH: 25 pg — AB (ref 26.0–34.0)
MCHC: 32 g/dL (ref 32.0–36.0)
MCV: 77.9 fL — ABNORMAL LOW (ref 80.0–100.0)
MONO ABS: 0.8 10*3/uL (ref 0.2–1.0)
MONOS PCT: 8 %
NEUTROS ABS: 7.7 10*3/uL — AB (ref 1.4–6.5)
Neutrophils Relative %: 78 %
PLATELETS: 247 10*3/uL (ref 150–440)
RBC: 2.78 MIL/uL — AB (ref 4.40–5.90)
RDW: 17.4 % — ABNORMAL HIGH (ref 11.5–14.5)
WBC: 9.7 10*3/uL (ref 3.8–10.6)

## 2016-02-15 LAB — GLUCOSE, CAPILLARY
GLUCOSE-CAPILLARY: 105 mg/dL — AB (ref 65–99)
GLUCOSE-CAPILLARY: 104 mg/dL — AB (ref 65–99)
Glucose-Capillary: 92 mg/dL (ref 65–99)
Glucose-Capillary: 126 mg/dL — ABNORMAL HIGH (ref 65–99)

## 2016-02-15 LAB — CEA: CEA: 1.1 ng/mL (ref 0.0–4.7)

## 2016-02-15 LAB — APTT: APTT: 29 s (ref 24–36)

## 2016-02-15 LAB — PROTIME-INR
INR: 1.22
Prothrombin Time: 15.6 seconds — ABNORMAL HIGH (ref 11.4–15.0)

## 2016-02-15 LAB — PREPARE RBC (CROSSMATCH)

## 2016-02-15 MED ORDER — SODIUM CHLORIDE 0.9 % IV SOLN
Freq: Once | INTRAVENOUS | Status: AC
  Administered 2016-02-15: 15:00:00 via INTRAVENOUS

## 2016-02-15 NOTE — Progress Notes (Signed)
Patient ID: [name], male   DOB: 1961/08/09, 55 y.o.   MRN: [id number] [facility] NAME: [name]    MR#:  [id number]  DATE OF BIRTH:  08/01/1961  SUBJECTIVE:  Patient had a colonoscopy yesterday which revealed a colonic mass, was seen by surgery recommend a CT of the abdomen. Patient this morning denies any chest pains or shortness of breath    REVIEW OF SYSTEMS:   Review of Systems  Constitutional:Resolved malaise and fatigue. Negative for fever, chills and weight loss.  HENT: Negative for ear discharge, ear pain and nosebleeds.   Eyes: Negative for blurred vision, pain and discharge.  Respiratory: Negative for sputum production, shortness of breath, wheezing and stridor.   Cardiovascular: Negative for chest pain, palpitations, orthopnea and PND.  Gastrointestinal: Negative for nausea, vomiting, abdominal pain and diarrhea.  Genitourinary: Negative for urgency and frequency.  Musculoskeletal: Negative for back pain and joint pain.  Neurological: Positive for weakness. Negative for sensory change, speech change and focal weakness.  Psychiatric/Behavioral: Negative for depression and hallucinations. The patient is not nervous/anxious.   All other systems reviewed and are negative.   DRUG ALLERGIES:  No Known Allergies  VITALS:  Blood pressure 131/56, pulse 73, temperature 98.6 F (37 C), temperature source Oral, resp. rate 18, height 6\' 1"  (1.854 m), weight 122.471 kg (270 lb), SpO2 100 %.  PHYSICAL EXAMINATION:   Physical Exam  GENERAL:  55 y.o.-year-old patient lying in the bed with no acute distress.  EYES: Pupils equal, round, reactive to light and accommodation. No scleral icterus. Extraocular muscles intact.  HEENT: Head atraumatic, normocephalic. Oropharynx and nasopharynx clear.  NECK:  Supple, no jugular venous distention. No thyroid enlargement, no tenderness.  LUNGS: Normal breath sounds  bilaterally, no wheezing, rales, rhonchi. No use of accessory muscles of respiration.  CARDIOVASCULAR: S1, S2 normal. No murmurs, rubs, or gallops.  ABDOMEN: Soft, nontender, nondistended. Bowel sounds present. No organomegaly or mass.  EXTREMITIES: No cyanosis, clubbing or edema b/l.    NEUROLOGIC: Cranial nerves II through XII are intact. No focal Motor or sensory deficits b/l.   PSYCHIATRIC:  patient is alert and oriented x 3.  SKIN: No obvious rash, lesion, or ulcer.   LABORATORY PANEL:  CBC  Recent Labs Lab 02/12/16 1343  02/14/16 1326  WBC 12.1*  --   --   HGB 6.0*  < > 7.2*  HCT 19.5*  --   --   PLT 321  --   --   < > = values in this interval not displayed.  Chemistries   Recent Labs Lab 02/12/16 1343 02/13/16 0546  NA 134* 136  K 4.1 4.1  CL 106 110  CO2 21* 21*  GLUCOSE 144* 78  BUN 34* 32*  CREATININE 3.84* 3.75*  CALCIUM 8.2* 8.0*  AST 20  --   ALT 9*  --   ALKPHOS 78  --   BILITOT 0.4  --    Cardiac Enzymes No results for input(s): TROPONINI in the last 168 hours. RADIOLOGY:  Ct Abdomen Pelvis Wo Contrast  02/14/2016  CLINICAL DATA:  Right colon near obstructing mass. Pt w significant anemia. 55 y.o. male with a history of chronic IDA (on ferrous sulfate 325mg  daily), CKD stage IV, DM II, HTN, and HLD admitted with a GI bleed and symptomatic anemia. Patient reports several days of increase fatigue, weakness, dyspnea, and dizziness. Stool may have been black a few days ago  per patient. No frank rectal bleeding or hematochezia. EXAM: CT ABDOMEN AND PELVIS WITHOUT CONTRAST TECHNIQUE: Multidetector CT imaging of the abdomen and pelvis was performed following the standard protocol without IV contrast. COMPARISON:  None. FINDINGS: Lung bases:  Clear.  Heart normal in size. Hepatobiliary: Calcification in the right liver lobe. Liver otherwise unremarkable. Gallbladder surgically absent. No bile duct dilation. Spleen, pancreas, adrenal glands:  Unremarkable. Kidneys,  ureters, bladder: There no renal masses or stones. No hydronephrosis. Ureters are normal in course and in caliber. Bladder is unremarkable. Lymph nodes: There are several prominent gastrohepatic ligament and retroperitoneal lymph nodes. Largest gastrohepatic ligament node measures 1 cm short axis. Largest retroperitoneal node measures 1 cm short axis. No other adenopathy. Ascites:  None. Gastrointestinal: Possible mass in the right colon. No other evidence of a colonic mass. No evidence of bowel obstruction. There are scattered diverticula along the sigmoid colon. No diverticulitis. Normal stomach and small bowel. Normal appendix visualized. Musculoskeletal: Arthropathic changes of the hip joints. Disc degenerative changes noted along the visualized spine. No osteoblastic or osteolytic lesions. IMPRESSION: 1. No acute findings within the abdomen pelvis. 2. Possible mass in the right colon. No evidence of bowel obstruction or bowel inflammation. 3. Mildly prominent lymph nodes along the gastrohepatic ligament and retroperitoneum, most likely reactive. Electronically Signed   By: Lajean Manes M.D.   On: 02/14/2016 21:03   ASSESSMENT AND PLAN:  [name] is a 55 y.o. male with a known history of Hypertension, diabetes, anemia and a CKD stage IV. The patient has had fatigue, dizziness, dyspnea and generalized weakness for several days  1. Acute on chronic anemia  This is due to chronic GI blood loss as a result of colonic mass status post colonoscopy Was seen by surgery they recommended hemoglobin greater than 8 prior to discharge, outpatient follow-up for surgical intervention CT scan done yesterday shows no evidence of metastatic disease We'll check CBC this morning and review if hemoglobin less than 8 transfuse him   2.DM-2Blood glucose currently normal continue sliding scale insulin   3.HTNBlood pressure is currently stable continue labetalol and Cozaar , we will adjust as needed  4.DVT  prophylaxis TED/SCDS  CODE STATUS: Full  DVT Prophylaxis: SCD/TEDS  TOTAL TIME TAKING CARE OF THIS PATIENT: 30 minutes.  >50% time spent on counselling and coordination of care  The will advance his diet likely discharge tomorrow if hemoglobin stable     Note: This dictation was prepared with Dragon dictation along with smaller phrase technology. Any transcriptional errors that result from this process are unintentional.  Dustin Flock M.D on 02/15/2016 at 9:24 AM  Between 7am to 6pm - Pager - [phone]  After 6pm go to [email or web address] - password [email or web address] [facility]  Office  [phone]  CC: Primary care physician; Kirk Ruths., MD

## 2016-02-15 NOTE — Consult Note (Addendum)
Patient ID: [name], male   DOB: 1961/03/19, 55 y.o.   MRN: [id number]  HPI [name] is a 55 y.o. male asked by Dr. Posey Pronto to be seen for cecal mass. He was recently admitted secondary to iron deficiency anemia that was symptomatic with weakness and fatigue. Specifically he denied any melena and he denied any family history of colorectal cancer. He is diabetic and has a history of chronic kidney disease but is not on hemodialysis. He has good cardiovascular performance and is able to perform more than 4 Mets of activity. Had a recent echocardiogram showing preserved ejection fraction with no motion abnormalities. Secondary to his anemia EGD and colonoscopy was performed showing a 7 cm mass in the in the cecum a 30 mm polypoid lesion at the descending colon that was tattooed and a third polypoid lesion measuring 7 mm at the transverse colon this was snared. He has required blood transfusion to keep his hemoglobin above 7. He had a history of cholecystectomy A CT scan of the abdomen without IV contrast was performed to rule out any metastatic disease specifically any liver metastasis without any evidence of distant disease. His coags are normal and his current hemoglobin is 7.2.  HPI  Past Medical History  Diagnosis Date  . Diabetes mellitus without complication ([place])   . Hyperlipemia   . History of hiatal hernia   . Anemia   . Arthritis   . Chronic kidney disease     kidney stones  . Hypertension     Hospitalized in October for elevated BP. given meds, dizziness when BP goes up  . Complication of anesthesia   . PONV (postoperative nausea and vomiting)     with epidural  . Swelling     of feet and legs    Past Surgical History  Procedure Laterality Date  . Cholecystectomy    . Neck surgery      cervical disc  . Achilles tendon repair Right   . Cataract extraction w/phaco Right 10/09/2015    Procedure: CATARACT EXTRACTION PHACO AND INTRAOCULAR LENS PLACEMENT (IOC);  Surgeon:  Leandrew Koyanagi, MD;  Location: [facility];  Service: Ophthalmology;  Laterality: Right;  DIABETIC - oral meds    Family History  Problem Relation Age of Onset  . Hypertension Other     Social History Social History  Substance Use Topics  . Smoking status: Never Smoker   . Smokeless tobacco: None  . Alcohol Use: No    No Known Allergies  Current Facility-Administered Medications  Medication Dose Route Frequency Provider Last Rate Last Dose  . acetaminophen (TYLENOL) tablet 650 mg  650 mg Oral Q6H PRN Demetrios Loll, MD       Or  . acetaminophen (TYLENOL) suppository 650 mg  650 mg Rectal Q6H PRN Demetrios Loll, MD      . albuterol (PROVENTIL) (2.5 MG/3ML) 0.083% nebulizer solution 2.5 mg  2.5 mg Nebulization Q2H PRN Demetrios Loll, MD      . ferrous sulfate tablet 325 mg  325 mg Oral Q breakfast Demetrios Loll, MD   325 mg at 02/15/16 1015  . insulin aspart (novoLOG) injection 0-5 Units  0-5 Units Subcutaneous QHS Demetrios Loll, MD   0 Units at 02/12/16 2200  . insulin aspart (novoLOG) injection 0-9 Units  0-9 Units Subcutaneous TID WC Demetrios Loll, MD   1 Units at 02/14/16 [phone]  . labetalol (NORMODYNE) tablet 200 mg  200 mg Oral BID Fritzi Mandes, MD   200  mg at 02/15/16 1015  . losartan (COZAAR) tablet 50 mg  50 mg Oral Daily Demetrios Loll, MD   50 mg at 02/15/16 1015  . ondansetron (ZOFRAN) tablet 4 mg  4 mg Oral Q6H PRN Demetrios Loll, MD       Or  . ondansetron [facility]) injection 4 mg  4 mg Intravenous Q6H PRN Demetrios Loll, MD      . pantoprazole (PROTONIX) injection 40 mg  40 mg Intravenous Q12H Demetrios Loll, MD   40 mg at 02/15/16 1014     Review of Systems A 10 point review of systems was asked and was negative except for the information on the HPI  Physical Exam Blood pressure 131/56, pulse 73, temperature 98.6 F (37 C), temperature source Oral, resp. rate 18, height 6\' 1"  (1.854 m), weight 122.471 kg (270 lb), SpO2 100 %. CONSTITUTIONAL: No acute distress awake alert EYES: Pupils are equal,  round, and reactive to light, Sclera are non-icteric. EARS, NOSE, MOUTH AND THROAT: The oropharynx is clear. The oral mucosa is pink and moist. Hearing is intact to voice. LYMPH NODES:  Lymph nodes in the neck are normal. RESPIRATORY:  Lungs are clear. There is normal respiratory effort, with equal breath sounds bilaterally, and without pathologic use of accessory muscles. CARDIOVASCULAR: Heart is regular without murmurs, gallops, or rubs. GI: The abdomen is  soft, nontender, and nondistended. There are no palpable masses. There is no hepatosplenomegaly. There are normal bowel sounds in all quadrants. GU: Rectal deferred.   MUSCULOSKELETAL: Normal muscle strength and tone. No cyanosis or edema.   SKIN: Turgor is good and there are no pathologic skin lesions or ulcers. NEUROLOGIC: Motor and sensation is grossly normal. Cranial nerves are grossly intact. PSYCH:  Oriented to person, place and time. Affect is normal.  Data Reviewed I have personally reviewed the patient's imaging, laboratory findings and medical records.    Assessment/Plan  Near obstructing cecal lesion causing severe anemia. Patient in need for right hemicolectomy. 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 Regardless of the pathology he will need a right hemicolectomy in an elective setting. Procedure discussed with the patient in detail. Risks benefits and possible complications as well as postoperative course. Currently the patient lives on his own and does not have any close family with him. I did give him the option of performing days where he was in the hospital or as an outpatient in the next couple weeks. He will follow up with me on Wednesday in my office where I will counsel him more for the operative management and hopefully we will get the final pathology results. In the meantime I will like to see his hemoglobin the above 8 and hopefully will type and cross him for the operation. He will also  need prepped at that time. He is also advised to hold his aspirin for at least 5 days. He should not need any further preoperative workup since his at a recent echo and current labs. Sensitive counseling provided. Please note that I personally reviewed all the medical records and imaging studies.    Caroleen Hamman, MD FACS General Surgeon 02/15/2016, 12:34 PM

## 2016-02-16 LAB — TYPE AND SCREEN
ABO/RH(D): A POS
ANTIBODY SCREEN: NEGATIVE
UNIT DIVISION: 0
Unit division: 0
Unit division: 0

## 2016-02-16 LAB — CBC
HCT: 25.2 % — ABNORMAL LOW (ref 40.0–52.0)
Hemoglobin: 8.2 g/dL — ABNORMAL LOW (ref 13.0–18.0)
MCH: 25.1 pg — AB (ref 26.0–34.0)
MCHC: 32.4 g/dL (ref 32.0–36.0)
MCV: 77.7 fL — ABNORMAL LOW (ref 80.0–100.0)
Platelets: 247 10*3/uL (ref 150–440)
RBC: 3.24 MIL/uL — ABNORMAL LOW (ref 4.40–5.90)
RDW: 18.5 % — AB (ref 11.5–14.5)
WBC: 10.2 10*3/uL (ref 3.8–10.6)

## 2016-02-16 LAB — GLUCOSE, CAPILLARY: GLUCOSE-CAPILLARY: 103 mg/dL — AB (ref 65–99)

## 2016-02-16 LAB — CEA: CEA: 1.1 ng/mL (ref 0.0–4.7)

## 2016-02-16 MED ORDER — PANTOPRAZOLE SODIUM 40 MG PO TBEC: 40.0000 mg | DELAYED_RELEASE_TABLET | Freq: Every day | ORAL | Status: DC

## 2016-02-16 MED ORDER — ALBUTEROL SULFATE (2.5 MG/3ML) 0.083% IN NEBU: 2.5000 mg | INHALATION_SOLUTION | RESPIRATORY_TRACT | Status: DC | PRN

## 2016-02-16 NOTE — Discharge Summary (Signed)
[name], 55 y.o., DOB 1960-12-29, MRN [id number]. Admission date: 02/12/2016 Discharge Date 02/16/2016 Primary MD Kirk Ruths., MD Admitting Physician Demetrios Loll, MD  Admission Diagnosis  Gastrointestinal bleeding, upper [K92.2]  Discharge Diagnosis   Principal Problem:   GIB (gastrointestinal bleeding) lower due to mass   Anemia status post transfusion   Colonic mass will need outpatient follow-up with surgical resection   Diabetes type 2 Hyperlipidemia Height of hernia Arthritis Chronic kidney disease stage IV        Hospital Course patient is a 55 year old male with history hypertension, diabetes, anemia and chronic kidney disease who was seen by her primary care provider had blood work checked and was noticed to have worsening in hemoglobin he was sent to the emergency room and further evaluation was recommended. Patient was admitted to the hospital and was transfused. He was in his seen in consultation by GI who performed the EGD and a colonoscopy. Patient was noted to partially obstructing tumor in the proximal ascending colon was noted. Surgery saw the patient and they offered the patient surgery during the hospitalization or outpatient follow-up patient chose to follow-up this coming Wednesday to schedule for the surgery. He is currently feeling much better and is stable for discharge.             Consults  GI, general surgery  Significant Tests:  See full reports for all details      Ct Abdomen Pelvis Wo Contrast  02/14/2016  CLINICAL DATA:  Right colon near obstructing mass. Pt w significant anemia. 55 y.o. male with a history of chronic IDA (on ferrous sulfate 325mg  daily), CKD stage IV, DM II, HTN, and HLD admitted with a GI bleed and symptomatic anemia. Patient reports several days of increase fatigue, weakness, dyspnea, and dizziness. Stool may have been black a few days ago per patient. No frank rectal bleeding or hematochezia. EXAM: CT ABDOMEN AND  PELVIS WITHOUT CONTRAST TECHNIQUE: Multidetector CT imaging of the abdomen and pelvis was performed following the standard protocol without IV contrast. COMPARISON:  None. FINDINGS: Lung bases:  Clear.  Heart normal in size. Hepatobiliary: Calcification in the right liver lobe. Liver otherwise unremarkable. Gallbladder surgically absent. No bile duct dilation. Spleen, pancreas, adrenal glands:  Unremarkable. Kidneys, ureters, bladder: There no renal masses or stones. No hydronephrosis. Ureters are normal in course and in caliber. Bladder is unremarkable. Lymph nodes: There are several prominent gastrohepatic ligament and retroperitoneal lymph nodes. Largest gastrohepatic ligament node measures 1 cm short axis. Largest retroperitoneal node measures 1 cm short axis. No other adenopathy. Ascites:  None. Gastrointestinal: Possible mass in the right colon. No other evidence of a colonic mass. No evidence of bowel obstruction. There are scattered diverticula along the sigmoid colon. No diverticulitis. Normal stomach and small bowel. Normal appendix visualized. Musculoskeletal: Arthropathic changes of the hip joints. Disc degenerative changes noted along the visualized spine. No osteoblastic or osteolytic lesions. IMPRESSION: 1. No acute findings within the abdomen pelvis. 2. Possible mass in the right colon. No evidence of bowel obstruction or bowel inflammation. 3. Mildly prominent lymph nodes along the gastrohepatic ligament and retroperitoneum, most likely reactive. Electronically Signed   By: Lajean Manes M.D.   On: 02/14/2016 21:03       Today   Subjective:   [name] feels well denies any complaint  Objective:   Blood pressure 155/74, pulse 69, temperature 98 F (36.7 C), temperature source Oral, resp. rate 17, height 6\' 1"  (1.854 m), weight 122.471 kg (270  lb), SpO2 97 %.  .  Intake/Output Summary (Last 24 hours) at 02/16/16 1214 Last data filed at 02/16/16 0949  Gross per 24 hour  Intake  1351.96 ml  Output    500 ml  Net 851.96 ml    Exam VITAL SIGNS: Blood pressure 155/74, pulse 69, temperature 98 F (36.7 C), temperature source Oral, resp. rate 17, height 6\' 1"  (1.854 m), weight 122.471 kg (270 lb), SpO2 97 %.  GENERAL:  55 y.o.-year-old patient lying in the bed with no acute distress.  EYES: Pupils equal, round, reactive to light and accommodation. No scleral icterus. Extraocular muscles intact.  HEENT: Head atraumatic, normocephalic. Oropharynx and nasopharynx clear.  NECK:  Supple, no jugular venous distention. No thyroid enlargement, no tenderness.  LUNGS: Normal breath sounds bilaterally, no wheezing, rales,rhonchi or crepitation. No use of accessory muscles of respiration.  CARDIOVASCULAR: S1, S2 normal. No murmurs, rubs, or gallops.  ABDOMEN: Soft, nontender, nondistended. Bowel sounds present. No organomegaly or mass.  EXTREMITIES: No pedal edema, cyanosis, or clubbing.  NEUROLOGIC: Cranial nerves II through XII are intact. Muscle strength 5/5 in all extremities. Sensation intact. Gait not checked.  PSYCHIATRIC: The patient is alert and oriented x 3.  SKIN: No obvious rash, lesion, or ulcer.   Data Review     CBC w Diff: Lab Results  Component Value Date   WBC 10.2 02/16/2016   HGB 8.2* 02/16/2016   HCT 25.2* 02/16/2016   PLT 247 02/16/2016   LYMPHOPCT 9 02/15/2016   MONOPCT 8 02/15/2016   EOSPCT 4 02/15/2016   BASOPCT 1 02/15/2016   CMP: Lab Results  Component Value Date   NA 136 02/13/2016   K 4.1 02/13/2016   CL 110 02/13/2016   CO2 21* 02/13/2016   BUN 32* 02/13/2016   CREATININE 3.75* 02/13/2016   PROT 7.4 02/12/2016   ALBUMIN 3.6 02/12/2016   BILITOT 0.4 02/12/2016   ALKPHOS 78 02/12/2016   AST 20 02/12/2016   ALT 9* 02/12/2016  .  Micro Results No results found for this or any previous visit (from the past 240 hour(s)).      Code Status Orders        Start     Ordered   02/12/16 1930  Full code   Continuous     02/12/16  1929    Code Status History    Date Active Date Inactive Code Status Order ID Comments User Context   08/15/2015 11:28 PM 08/16/2015  8:45 PM Full Code [id number]  Lytle Butte, MD ED          Follow-up Information    Follow up with Kirk Ruths., MD In 7 days.   Specialty:  Internal Medicine   Contact information:   [place] [address] [phone]       Follow up with Jules Husbands, MD In 7 days.   Specialty:  General Surgery   Contact information:   [address] [place] [address] [phone]       Discharge Medications     Medication List    STOP taking these medications        aspirin 81 MG EC tablet      TAKE these medications        albuterol (2.5 MG/3ML) 0.083% nebulizer solution  Commonly known as:  PROVENTIL  Take 3 mLs (2.5 mg total) by nebulization every 2 (two) hours as needed for wheezing.  ferrous sulfate 325 (65 FE) MG tablet  Take 325 mg by mouth daily with breakfast.     glimepiride 2 MG tablet  Commonly known as:  AMARYL  Take 2 mg by mouth daily with breakfast.     labetalol 100 MG tablet  Commonly known as:  NORMODYNE  Take 1 tablet (100 mg total) by mouth 2 (two) times daily.     losartan 50 MG tablet  Commonly known as:  COZAAR  Take 1 tablet (50 mg total) by mouth daily.     pantoprazole 40 MG tablet  Commonly known as:  PROTONIX  Take 1 tablet (40 mg total) by mouth daily.     sitaGLIPtin 100 MG tablet  Commonly known as:  JANUVIA  Take 100 mg by mouth daily with breakfast.           Total Time in preparing paper work, data evaluation and todays exam - 35 minutes  Dustin Flock M.D on 02/16/2016 at 12:14 PM  [facility]   Office  [phone]

## 2016-02-16 NOTE — Discharge Instructions (Signed)
°  DIET:  °Diabetic diet ° °DISCHARGE CONDITION:  °Stable ° °ACTIVITY:  °Activity as tolerated ° °OXYGEN:  °Home Oxygen: No. °  °Oxygen Delivery: room air ° °DISCHARGE LOCATION:  °home  ° ° °ADDITIONAL DISCHARGE INSTRUCTION: ° ° °If you experience worsening of your admission symptoms, develop shortness of breath, life threatening emergency, suicidal or homicidal thoughts you must seek medical attention immediately by calling 911 or calling your MD immediately  if symptoms less severe. ° °You Must read complete instructions/literature along with all the possible adverse reactions/side effects for all the Medicines you take and that have been prescribed to you. Take any new Medicines after you have completely understood and accpet all the possible adverse reactions/side effects.  ° °Please note ° °You were cared for by a hospitalist during your hospital stay. If you have any questions about your discharge medications or the care you received while you were in the hospital after you are discharged, you can call the unit and asked to speak with the hospitalist on call if the hospitalist that took care of you is not available. Once you are discharged, your primary care physician will handle any further medical issues. Please note that NO REFILLS for any discharge medications will be authorized once you are discharged, as it is imperative that you return to your primary care physician (or establish a relationship with a primary care physician if you do not have one) for your aftercare needs so that they can reassess your need for medications and monitor your lab values. ° ° °

## 2016-02-16 NOTE — Progress Notes (Signed)
CC: colon mass Subjective: Feeling better, no pain, no melena  Hb 8.2  Objective: Vital signs in last 24 hours: Temp:  [98 F (36.7 C)-98.3 F (36.8 C)] 98 F (36.7 C) (04/09 0522) Pulse Rate:  [69-78] 69 (04/09 0522) Resp:  [14-17] 17 (04/09 0522) BP: (139-157)/(64-74) 155/74 mmHg (04/09 0522) SpO2:  [97 %-100 %] 97 % (04/09 0522) Last BM Date: 02/14/16  Intake/Output from previous day: 04/08 0701 - 04/09 0700 In: 1112 [P.O.:840; Blood:272] Out: -  Intake/Output this shift:    Physical exam: NAD alert Abd: soft, NT, non distended Ext: no edema , well perfused  Lab Results: CBC   Recent Labs  02/15/16 0615 02/16/16 0501  WBC 9.7 10.2  HGB 6.9* 8.2*  HCT 21.7* 25.2*  PLT 247 247   BMET No results for input(s): NA, K, CL, CO2, GLUCOSE, BUN, CREATININE, CALCIUM in the last 72 hours. PT/INR  Recent Labs  02/15/16 0615  LABPROT 15.6*  INR 1.22   ABG No results for input(s): PHART, HCO3 in the last 72 hours.  Invalid input(s): PCO2, PO2  Studies/Results: Ct Abdomen Pelvis Wo Contrast  02/14/2016  CLINICAL DATA:  Right colon near obstructing mass. Pt w significant anemia. 55 y.o. male with a history of chronic IDA (on ferrous sulfate 325mg  daily), CKD stage IV, DM II, HTN, and HLD admitted with a GI bleed and symptomatic anemia. Patient reports several days of increase fatigue, weakness, dyspnea, and dizziness. Stool may have been black a few days ago per patient. No frank rectal bleeding or hematochezia. EXAM: CT ABDOMEN AND PELVIS WITHOUT CONTRAST TECHNIQUE: Multidetector CT imaging of the abdomen and pelvis was performed following the standard protocol without IV contrast. COMPARISON:  None. FINDINGS: Lung bases:  Clear.  Heart normal in size. Hepatobiliary: Calcification in the right liver lobe. Liver otherwise unremarkable. Gallbladder surgically absent. No bile duct dilation. Spleen, pancreas, adrenal glands:  Unremarkable. Kidneys, ureters, bladder: There no  renal masses or stones. No hydronephrosis. Ureters are normal in course and in caliber. Bladder is unremarkable. Lymph nodes: There are several prominent gastrohepatic ligament and retroperitoneal lymph nodes. Largest gastrohepatic ligament node measures 1 cm short axis. Largest retroperitoneal node measures 1 cm short axis. No other adenopathy. Ascites:  None. Gastrointestinal: Possible mass in the right colon. No other evidence of a colonic mass. No evidence of bowel obstruction. There are scattered diverticula along the sigmoid colon. No diverticulitis. Normal stomach and small bowel. Normal appendix visualized. Musculoskeletal: Arthropathic changes of the hip joints. Disc degenerative changes noted along the visualized spine. No osteoblastic or osteolytic lesions. IMPRESSION: 1. No acute findings within the abdomen pelvis. 2. Possible mass in the right colon. No evidence of bowel obstruction or bowel inflammation. 3. Mildly prominent lymph nodes along the gastrohepatic ligament and retroperitoneum, most likely reactive. Electronically Signed   By: Lajean Manes M.D.   On: 02/14/2016 21:03    Anti-infectives: Anti-infectives    None      Assessment/Plan: MAy DC home He will come next wed to my office so we can schedule his R colectomy   Caroleen Hamman, MD, FACS  02/16/2016

## 2016-02-16 NOTE — Progress Notes (Signed)
IV and tele were removed. Discharge instructions, follow-up appointments, and prescriptions were provided to the pt. The pt walked himself downstairs. No family available close by to pick him up.

## 2016-02-18 ENCOUNTER — Encounter: Payer: Self-pay | Admitting: Gastroenterology

## 2016-02-18 LAB — SURGICAL PATHOLOGY

## 2016-02-19 ENCOUNTER — Ambulatory Visit (INDEPENDENT_AMBULATORY_CARE_PROVIDER_SITE_OTHER): Payer: BLUE CROSS/BLUE SHIELD | Admitting: Surgery

## 2016-02-19 ENCOUNTER — Telehealth: Payer: Self-pay | Admitting: Surgery

## 2016-02-19 ENCOUNTER — Encounter: Payer: Self-pay | Admitting: Surgery

## 2016-02-19 VITALS — BP 201/93 | HR 80 | Temp 98.3°F | Ht 72.0 in | Wt 270.0 lb

## 2016-02-19 DIAGNOSIS — C182 Malignant neoplasm of ascending colon: Secondary | ICD-10-CM

## 2016-02-19 MED ORDER — BISACODYL EC 5 MG PO TBEC: DELAYED_RELEASE_TABLET | ORAL | Status: AC

## 2016-02-19 MED ORDER — POLYETHYLENE GLYCOL 3350 17 GM/SCOOP PO POWD: 1.0000 | Freq: Once | ORAL | Status: AC

## 2016-02-19 NOTE — Telephone Encounter (Signed)
Pt advised of pre op date/time and sx date. Sx: 02/17/2016 with Dr Dahlia Byes, Dr Genevive Bi assisting--Laparoscopic right hemicolectomy.  Pre op: 02/25/16 @ 1:00p--office.   Patient made aware to call [phone], between 1-3:00pm the day before surgery, to find out what time to arrive.     Deductible: 762.27 Co insurance: 30% Physician estimate: [phone]. Patient advised.

## 2016-02-19 NOTE — Progress Notes (Signed)
[name] is a 55 year old male recently seen in the hospital symptomatic anemia requiring transfusion secondary to a large cecal mass found on colonoscopy. Pathology results show evidence of adenocarcinoma this was a near obstructing lesion. Another T colon c/w TA. CT scan without contrast show no evidence of metastatic disease, and CEA was normal. He does have hypertension, diabetes and renal insufficiency. He states that he feels much better after he was transfused. His last hemoglobin was 8.2. He reports some normal stool with some occasional melena. Overall he feels better and with more energy. Only abdominal operation and its a laparoscopic cholecystectomy Today he is accompanied by his best friend. 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 Discussed with him in detail about this disease process. The need for adjuvant chemotherapy indicates he has no positive disease. I have discussed in detail with him about the operation will start with a hand-assisted laparoscopic colectomy possible open. Risk, benefits and possible complications including but not limited to: Bleeding, blood transfusion, re-interventions, anastomotic leak, abscess, exacerbation of renal failure requiring dialysis. He understands and wishes to proceed. We will also make sure that he sees anesthesia for a preop visit. Continue to hold aspirin and continue iron supplements. Extensive counseling provided and more than 40 minutes was spent in this encounter with the majority of time allocated to face-to-face.

## 2016-02-25 ENCOUNTER — Encounter
Admission: RE | Admit: 2016-02-25 | Discharge: 2016-02-25 | Disposition: A | Discharge status: Home / Self Care | Payer: BLUE CROSS/BLUE SHIELD | Source: Ambulatory Visit | Attending: Surgery | Admitting: Surgery

## 2016-02-25 DIAGNOSIS — Z01812 Encounter for preprocedural laboratory examination: Secondary | ICD-10-CM | POA: Diagnosis present

## 2016-02-25 HISTORY — DX: Gastro-esophageal reflux disease without esophagitis: K21.9

## 2016-02-25 LAB — CBC
HCT: 28 % — ABNORMAL LOW (ref 40.0–52.0)
Hemoglobin: 9 g/dL — ABNORMAL LOW (ref 13.0–18.0)
MCH: 24.9 pg — AB (ref 26.0–34.0)
MCHC: 32.2 g/dL (ref 32.0–36.0)
MCV: 77.3 fL — ABNORMAL LOW (ref 80.0–100.0)
PLATELETS: 271 10*3/uL (ref 150–440)
RBC: 3.62 MIL/uL — AB (ref 4.40–5.90)
RDW: 18.5 % — AB (ref 11.5–14.5)
WBC: 8.3 10*3/uL (ref 3.8–10.6)

## 2016-02-25 LAB — TYPE AND SCREEN
ABO/RH(D): A POS
ANTIBODY SCREEN: NEGATIVE
Extend sample reason: TRANSFUSED

## 2016-02-25 NOTE — Patient Instructions (Signed)
Your procedure is scheduled on: Tuesday 02/21/2016 Report to Day Surgery. 2ND FLOOR MEDICAL MALL ENTRANCE To find out your arrival time please call [phone] between 1PM - 3PM on Monday 03/02/16.  Remember: Instructions that are not followed completely may result in serious medical risk, up to and including death, or upon the discretion of your surgeon and anesthesiologist your surgery may need to be rescheduled.    __X__ 1. Do not eat food or drink liquids after midnight. No gum chewing or hard candies.     __X__ 2. No Alcohol for 24 hours before or after surgery.   ____ 3. Bring all medications with you on the day of surgery if instructed.    __X__ 4. Notify your doctor if there is any change in your medical condition     (cold, fever, infections).     Do not wear jewelry, make-up, hairpins, clips or nail polish.  Do not wear lotions, powders, or perfumes.   Do not shave 48 hours prior to surgery. Men may shave face and neck.  Do not bring valuables to the hospital.    [facility] is not responsible for any belongings or valuables.               Contacts, dentures or bridgework may not be worn into surgery.  Leave your suitcase in the car. After surgery it may be brought to your room.  For patients admitted to the hospital, discharge time is determined by your                treatment team.   Patients discharged the day of surgery will not be allowed to drive home.   Please read over the following fact sheets that you were given:   MRSA Information and Surgical Site Infection Prevention   __X__ Take these medicines the morning of surgery with A SIP OF WATER:    1. LABETOLOL  2. LOSARTAN  3.   4.  5.  6.  ____ Fleet Enema (as directed)   __X__ Use CHG Soap as directed  ____ Use inhalers on the day of surgery  ____ Stop metformin 2 days prior to surgery    ____ Take 1/2 of usual insulin dose the night before surgery and none on the morning of surgery.   ____ Stop  Coumadin/Plavix/aspirin on   ____ Stop Anti-inflammatories on    ____ Stop supplements until after surgery.    ____ Bring C-Pap to the hospital.

## 2016-03-03 ENCOUNTER — Encounter: Admission: RE | Disposition: E | Payer: Self-pay | Source: Ambulatory Visit | Attending: Surgery

## 2016-03-03 ENCOUNTER — Inpatient Hospital Stay: Payer: BLUE CROSS/BLUE SHIELD | Admitting: Anesthesiology

## 2016-03-03 ENCOUNTER — Inpatient Hospital Stay
Admission: RE | Admit: 2016-03-03 | Discharge: 2016-03-09 | DRG: 329 | Disposition: E | Payer: BLUE CROSS/BLUE SHIELD | Source: Ambulatory Visit | Attending: Surgery | Admitting: Surgery

## 2016-03-03 ENCOUNTER — Encounter: Payer: Self-pay | Admitting: *Deleted

## 2016-03-03 DIAGNOSIS — I517 Cardiomegaly: Secondary | ICD-10-CM | POA: Diagnosis present

## 2016-03-03 DIAGNOSIS — J9600 Acute respiratory failure, unspecified whether with hypoxia or hypercapnia: Secondary | ICD-10-CM | POA: Diagnosis present

## 2016-03-03 DIAGNOSIS — R740 Nonspecific elevation of levels of transaminase and lactic acid dehydrogenase [LDH]: Secondary | ICD-10-CM | POA: Diagnosis present

## 2016-03-03 DIAGNOSIS — D500 Iron deficiency anemia secondary to blood loss (chronic): Secondary | ICD-10-CM | POA: Diagnosis present

## 2016-03-03 DIAGNOSIS — Z8249 Family history of ischemic heart disease and other diseases of the circulatory system: Secondary | ICD-10-CM | POA: Diagnosis not present

## 2016-03-03 DIAGNOSIS — Z9841 Cataract extraction status, right eye: Secondary | ICD-10-CM | POA: Diagnosis not present

## 2016-03-03 DIAGNOSIS — Z79899 Other long term (current) drug therapy: Secondary | ICD-10-CM | POA: Diagnosis not present

## 2016-03-03 DIAGNOSIS — E669 Obesity, unspecified: Secondary | ICD-10-CM | POA: Diagnosis present

## 2016-03-03 DIAGNOSIS — Z9889 Other specified postprocedural states: Secondary | ICD-10-CM | POA: Diagnosis not present

## 2016-03-03 DIAGNOSIS — Z87442 Personal history of urinary calculi: Secondary | ICD-10-CM

## 2016-03-03 DIAGNOSIS — R4020 Unspecified coma: Secondary | ICD-10-CM | POA: Diagnosis present

## 2016-03-03 DIAGNOSIS — R34 Anuria and oliguria: Secondary | ICD-10-CM | POA: Diagnosis present

## 2016-03-03 DIAGNOSIS — N170 Acute kidney failure with tubular necrosis: Secondary | ICD-10-CM | POA: Diagnosis present

## 2016-03-03 DIAGNOSIS — E1122 Type 2 diabetes mellitus with diabetic chronic kidney disease: Secondary | ICD-10-CM | POA: Diagnosis present

## 2016-03-03 DIAGNOSIS — K219 Gastro-esophageal reflux disease without esophagitis: Secondary | ICD-10-CM | POA: Diagnosis present

## 2016-03-03 DIAGNOSIS — Z01818 Encounter for other preprocedural examination: Secondary | ICD-10-CM

## 2016-03-03 DIAGNOSIS — E1121 Type 2 diabetes mellitus with diabetic nephropathy: Secondary | ICD-10-CM | POA: Diagnosis present

## 2016-03-03 DIAGNOSIS — I129 Hypertensive chronic kidney disease with stage 1 through stage 4 chronic kidney disease, or unspecified chronic kidney disease: Secondary | ICD-10-CM | POA: Diagnosis present

## 2016-03-03 DIAGNOSIS — Z4659 Encounter for fitting and adjustment of other gastrointestinal appliance and device: Secondary | ICD-10-CM

## 2016-03-03 DIAGNOSIS — Z6841 Body Mass Index (BMI) 40.0 and over, adult: Secondary | ICD-10-CM

## 2016-03-03 DIAGNOSIS — I469 Cardiac arrest, cause unspecified: Secondary | ICD-10-CM | POA: Diagnosis not present

## 2016-03-03 DIAGNOSIS — E875 Hyperkalemia: Secondary | ICD-10-CM | POA: Diagnosis present

## 2016-03-03 DIAGNOSIS — C182 Malignant neoplasm of ascending colon: Principal | ICD-10-CM | POA: Insufficient documentation

## 2016-03-03 DIAGNOSIS — R000 Tachycardia, unspecified: Secondary | ICD-10-CM | POA: Diagnosis present

## 2016-03-03 DIAGNOSIS — I9589 Other hypotension: Secondary | ICD-10-CM | POA: Diagnosis present

## 2016-03-03 DIAGNOSIS — R579 Shock, unspecified: Secondary | ICD-10-CM | POA: Diagnosis present

## 2016-03-03 DIAGNOSIS — E872 Acidosis: Secondary | ICD-10-CM | POA: Diagnosis present

## 2016-03-03 DIAGNOSIS — K660 Peritoneal adhesions (postprocedural) (postinfection): Secondary | ICD-10-CM | POA: Diagnosis present

## 2016-03-03 DIAGNOSIS — N184 Chronic kidney disease, stage 4 (severe): Secondary | ICD-10-CM | POA: Diagnosis present

## 2016-03-03 DIAGNOSIS — K6389 Other specified diseases of intestine: Secondary | ICD-10-CM | POA: Diagnosis present

## 2016-03-03 HISTORY — PX: LAPAROSCOPIC RIGHT COLECTOMY: SHX5925

## 2016-03-03 LAB — CBC
HEMATOCRIT: 28.7 % — AB (ref 40.0–52.0)
Hemoglobin: 8.9 g/dL — ABNORMAL LOW (ref 13.0–18.0)
MCH: 24.4 pg — ABNORMAL LOW (ref 26.0–34.0)
MCHC: 31.1 g/dL — AB (ref 32.0–36.0)
MCV: 78.7 fL — ABNORMAL LOW (ref 80.0–100.0)
PLATELETS: 267 10*3/uL (ref 150–440)
RBC: 3.65 MIL/uL — ABNORMAL LOW (ref 4.40–5.90)
RDW: 17.7 % — AB (ref 11.5–14.5)
WBC: 18.5 10*3/uL — AB (ref 3.8–10.6)

## 2016-03-03 LAB — GLUCOSE, CAPILLARY
GLUCOSE-CAPILLARY: 175 mg/dL — AB (ref 65–99)
GLUCOSE-CAPILLARY: 237 mg/dL — AB (ref 65–99)
Glucose-Capillary: 99 mg/dL (ref 65–99)
Glucose-Capillary: 152 mg/dL — ABNORMAL HIGH (ref 65–99)
Glucose-Capillary: 215 mg/dL — ABNORMAL HIGH (ref 65–99)

## 2016-03-03 LAB — CREATININE, SERUM
CREATININE: 3.88 mg/dL — AB (ref 0.61–1.24)
GFR, EST AFRICAN AMERICAN: 19 mL/min — AB (ref >60)
GFR, EST NON AFRICAN AMERICAN: 16 mL/min — AB (ref >60)

## 2016-03-03 LAB — SURGICAL PCR SCREEN
MRSA, PCR: NEGATIVE
Staphylococcus aureus: POSITIVE — AB

## 2016-03-03 SURGERY — COLECTOMY, RIGHT, LAPAROSCOPIC
Anesthesia: General | Wound class: Clean Contaminated

## 2016-03-03 MED ORDER — HYDROMORPHONE HCL 1 MG/ML IJ SOLN
INTRAMUSCULAR | Status: DC | PRN
  Administered 2016-03-03 (×2): .6 mg via INTRAVENOUS

## 2016-03-03 MED ORDER — INSULIN ASPART 100 UNIT/ML ~~LOC~~ SOLN
0.0000 [IU] | Freq: Three times a day (TID) | SUBCUTANEOUS | Status: DC
  Administered 2016-03-03: 3 [IU] via SUBCUTANEOUS
  Administered 2016-03-04 (×2): 1 [IU] via SUBCUTANEOUS
  Administered 2016-03-04: 2 [IU] via SUBCUTANEOUS
  Administered 2016-03-05 – 2016-03-07 (×3): 1 [IU] via SUBCUTANEOUS
  Filled 2016-03-03 (×3): qty 1
  Filled 2016-03-03: qty 2
  Filled 2016-03-03: qty 3
  Filled 2016-03-03 (×2): qty 1

## 2016-03-03 MED ORDER — SODIUM CHLORIDE 0.9 % IV SOLN
1.0000 g | INTRAVENOUS | Status: AC
  Administered 2016-03-03: 1 g via INTRAVENOUS
  Filled 2016-03-03: qty 1

## 2016-03-03 MED ORDER — BUPIVACAINE LIPOSOME 1.3 % IJ SUSP
INTRAMUSCULAR | Status: AC
Start: 2016-03-03 — End: 2016-03-03
  Filled 2016-03-03: qty 20

## 2016-03-03 MED ORDER — ONDANSETRON HCL 4 MG PO TABS: 4.0000 mg | ORAL_TABLET | Freq: Four times a day (QID) | ORAL | Status: DC | PRN

## 2016-03-03 MED ORDER — ONDANSETRON HCL 4 MG/2ML IJ SOLN
4.0000 mg | Freq: Four times a day (QID) | INTRAMUSCULAR | Status: DC | PRN
  Administered 2016-03-04 – 2016-03-05 (×2): 4 mg via INTRAVENOUS
  Filled 2016-03-03 (×2): qty 2

## 2016-03-03 MED ORDER — ONDANSETRON HCL 4 MG/2ML IJ SOLN
INTRAMUSCULAR | Status: DC | PRN
  Administered 2016-03-03: 4 mg via INTRAVENOUS

## 2016-03-03 MED ORDER — DEXAMETHASONE SODIUM PHOSPHATE 10 MG/ML IJ SOLN
INTRAMUSCULAR | Status: DC | PRN
  Administered 2016-03-03: 5 mg via INTRAVENOUS

## 2016-03-03 MED ORDER — LABETALOL HCL 200 MG PO TABS
100.0000 mg | ORAL_TABLET | Freq: Two times a day (BID) | ORAL | Status: DC
  Administered 2016-03-03 (×2): 100 mg via ORAL
  Filled 2016-03-03 (×3): qty 1

## 2016-03-03 MED ORDER — ALBUMIN HUMAN 5 % IV SOLN
INTRAVENOUS | Status: AC
  Filled 2016-03-03: qty 250

## 2016-03-03 MED ORDER — MIDAZOLAM HCL 2 MG/2ML IJ SOLN
INTRAMUSCULAR | Status: DC | PRN
  Administered 2016-03-03 (×2): 1 mg via INTRAVENOUS

## 2016-03-03 MED ORDER — ROCURONIUM BROMIDE 100 MG/10ML IV SOLN
INTRAVENOUS | Status: DC | PRN
  Administered 2016-03-03 (×3): 10 mg via INTRAVENOUS
  Administered 2016-03-03: 30 mg via INTRAVENOUS

## 2016-03-03 MED ORDER — ACETAMINOPHEN 500 MG PO TABS
1000.0000 mg | ORAL_TABLET | Freq: Four times a day (QID) | ORAL | Status: AC
  Administered 2016-03-03 – 2016-03-07 (×13): 1000 mg via ORAL
  Filled 2016-03-03 (×16): qty 2

## 2016-03-03 MED ORDER — HEPARIN SODIUM (PORCINE) 5000 UNIT/ML IJ SOLN: 5000.0000 [IU] | Freq: Once | INTRAMUSCULAR | Status: DC

## 2016-03-03 MED ORDER — HEPARIN SODIUM (PORCINE) 5000 UNIT/ML IJ SOLN
5000.0000 [IU] | Freq: Once | INTRAMUSCULAR | Status: AC
  Administered 2016-03-03: 5000 [IU] via SUBCUTANEOUS

## 2016-03-03 MED ORDER — FENTANYL CITRATE (PF) 100 MCG/2ML IJ SOLN
INTRAMUSCULAR | Status: AC
  Filled 2016-03-03: qty 2

## 2016-03-03 MED ORDER — PANTOPRAZOLE SODIUM 40 MG PO TBEC
40.0000 mg | DELAYED_RELEASE_TABLET | Freq: Every day | ORAL | Status: DC
  Administered 2016-03-03 – 2016-03-07 (×5): 40 mg via ORAL
  Filled 2016-03-03 (×5): qty 1

## 2016-03-03 MED ORDER — FAMOTIDINE 20 MG PO TABS
ORAL_TABLET | ORAL | Status: AC
  Filled 2016-03-03: qty 1

## 2016-03-03 MED ORDER — SUCCINYLCHOLINE CHLORIDE 20 MG/ML IJ SOLN
INTRAMUSCULAR | Status: DC | PRN
  Administered 2016-03-03: 120 mg via INTRAVENOUS

## 2016-03-03 MED ORDER — HEPARIN SODIUM (PORCINE) 5000 UNIT/ML IJ SOLN
INTRAMUSCULAR | Status: AC
  Administered 2016-03-03: 5000 [IU] via SUBCUTANEOUS
  Filled 2016-03-03: qty 1

## 2016-03-03 MED ORDER — SUGAMMADEX SODIUM 500 MG/5ML IV SOLN
INTRAVENOUS | Status: DC | PRN
  Administered 2016-03-03: 490 mg via INTRAVENOUS

## 2016-03-03 MED ORDER — OXYCODONE HCL 5 MG PO TABS
10.0000 mg | ORAL_TABLET | ORAL | Status: DC | PRN
  Administered 2016-03-04 – 2016-03-07 (×7): 10 mg via ORAL
  Filled 2016-03-03 (×8): qty 2

## 2016-03-03 MED ORDER — MORPHINE SULFATE (PF) 2 MG/ML IV SOLN
2.0000 mg | INTRAVENOUS | Status: DC | PRN
  Administered 2016-03-04 – 2016-03-08 (×5): 2 mg via INTRAVENOUS
  Filled 2016-03-03 (×7): qty 1

## 2016-03-03 MED ORDER — LACTATED RINGERS IV SOLN
INTRAVENOUS | Status: DC
  Administered 2016-03-03: 13:00:00 via INTRAVENOUS

## 2016-03-03 MED ORDER — ACETAMINOPHEN 10 MG/ML IV SOLN
INTRAVENOUS | Status: DC | PRN
  Administered 2016-03-03: 1000 mg via INTRAVENOUS

## 2016-03-03 MED ORDER — SODIUM CHLORIDE 0.9 % IV SOLN: Freq: Once | INTRAVENOUS | Status: DC

## 2016-03-03 MED ORDER — FENTANYL CITRATE (PF) 100 MCG/2ML IJ SOLN
25.0000 ug | INTRAMUSCULAR | Status: AC | PRN
  Administered 2016-03-03 (×6): 25 ug via INTRAVENOUS

## 2016-03-03 MED ORDER — PROMETHAZINE HCL 25 MG/ML IJ SOLN
6.2500 mg | Freq: Once | INTRAMUSCULAR | Status: AC
  Administered 2016-03-03: 6.25 mg via INTRAVENOUS
  Filled 2016-03-03: qty 1

## 2016-03-03 MED ORDER — ONDANSETRON HCL 4 MG/2ML IJ SOLN
INTRAMUSCULAR | Status: AC
  Administered 2016-03-03: 4 mg via INTRAVENOUS
  Filled 2016-03-03: qty 2

## 2016-03-03 MED ORDER — SODIUM CHLORIDE 0.9 % IV SOLN
INTRAVENOUS | Status: DC
  Administered 2016-03-03: 07:00:00 via INTRAVENOUS

## 2016-03-03 MED ORDER — FAMOTIDINE 20 MG PO TABS
20.0000 mg | ORAL_TABLET | Freq: Once | ORAL | Status: DC
Start: 2016-03-03 — End: 2016-03-03

## 2016-03-03 MED ORDER — PROPOFOL 10 MG/ML IV BOLUS
INTRAVENOUS | Status: DC | PRN
Start: 2016-03-03 — End: 2016-03-03
  Administered 2016-03-03: 200 mg via INTRAVENOUS

## 2016-03-03 MED ORDER — FENTANYL CITRATE (PF) 100 MCG/2ML IJ SOLN
INTRAMUSCULAR | Status: DC | PRN
  Administered 2016-03-03: 50 ug via INTRAVENOUS
  Administered 2016-03-03: 100 ug via INTRAVENOUS
  Administered 2016-03-03 (×2): 50 ug via INTRAVENOUS

## 2016-03-03 MED ORDER — ONDANSETRON HCL 4 MG/2ML IJ SOLN
4.0000 mg | Freq: Once | INTRAMUSCULAR | Status: AC | PRN
  Administered 2016-03-03: 4 mg via INTRAVENOUS

## 2016-03-03 MED ORDER — INSULIN ASPART 100 UNIT/ML ~~LOC~~ SOLN
0.0000 [IU] | Freq: Every day | SUBCUTANEOUS | Status: DC
  Administered 2016-03-03: 2 [IU] via SUBCUTANEOUS
  Filled 2016-03-03: qty 2

## 2016-03-03 MED ORDER — BUPIVACAINE-EPINEPHRINE (PF) 0.5% -1:200000 IJ SOLN
INTRAMUSCULAR | Status: AC
  Filled 2016-03-03: qty 30

## 2016-03-03 MED ORDER — DEXTROSE-NACL 5-0.9 % IV SOLN
INTRAVENOUS | Status: DC
  Administered 2016-03-03: 22:00:00 via INTRAVENOUS
  Administered 2016-03-03: 1000 mL via INTRAVENOUS
  Administered 2016-03-04 – 2016-03-07 (×8): via INTRAVENOUS

## 2016-03-03 MED ORDER — MUPIROCIN 2 % EX OINT
TOPICAL_OINTMENT | Freq: Two times a day (BID) | CUTANEOUS | Status: DC
  Administered 2016-03-03: 2 via NASAL
  Filled 2016-03-03: qty 22

## 2016-03-03 MED ORDER — ACETAMINOPHEN 10 MG/ML IV SOLN
INTRAVENOUS | Status: AC
  Filled 2016-03-03: qty 100

## 2016-03-03 MED ORDER — SODIUM CHLORIDE 0.9 % IV SOLN
INTRAVENOUS | Status: DC | PRN
  Administered 2016-03-03: 60 mL

## 2016-03-03 MED ORDER — FENTANYL CITRATE (PF) 100 MCG/2ML IJ SOLN
INTRAMUSCULAR | Status: AC
  Administered 2016-03-03: 25 ug via INTRAVENOUS
  Filled 2016-03-03: qty 2

## 2016-03-03 MED ORDER — OXYCODONE HCL 5 MG PO TABS: 10.0000 mg | ORAL_TABLET | ORAL | Status: DC | PRN

## 2016-03-03 MED ORDER — SODIUM CHLORIDE 0.9 % IJ SOLN
INTRAMUSCULAR | Status: AC
  Filled 2016-03-03: qty 50

## 2016-03-03 MED ORDER — NON FORMULARY: 6.2500 mg | Freq: Once | Status: DC

## 2016-03-03 MED ORDER — HYDROMORPHONE HCL 1 MG/ML IJ SOLN
0.2500 mg | INTRAMUSCULAR | Status: DC | PRN
  Administered 2016-03-03 (×2): 0.25 mg via INTRAVENOUS

## 2016-03-03 MED ORDER — FENTANYL CITRATE (PF) 100 MCG/2ML IJ SOLN: 25.0000 ug | INTRAMUSCULAR | Status: DC | PRN

## 2016-03-03 MED ORDER — LACTATED RINGERS IV SOLN
INTRAVENOUS | Status: DC | PRN
  Administered 2016-03-03: 08:00:00 via INTRAVENOUS

## 2016-03-03 MED ORDER — ONDANSETRON HCL 4 MG/2ML IJ SOLN: 4.0000 mg | Freq: Once | INTRAMUSCULAR | Status: DC | PRN

## 2016-03-03 MED ORDER — PROMETHAZINE HCL 25 MG/ML IJ SOLN: 6.2500 mg | Freq: Once | INTRAMUSCULAR | Status: DC

## 2016-03-03 MED ORDER — DIPHENHYDRAMINE HCL 50 MG/ML IJ SOLN: 12.5000 mg | Freq: Four times a day (QID) | INTRAMUSCULAR | Status: DC | PRN

## 2016-03-03 MED ORDER — CYCLOBENZAPRINE HCL 10 MG PO TABS
10.0000 mg | ORAL_TABLET | Freq: Three times a day (TID) | ORAL | Status: DC
  Administered 2016-03-03 – 2016-03-07 (×11): 10 mg via ORAL
  Filled 2016-03-03 (×12): qty 1

## 2016-03-03 MED ORDER — CHLORHEXIDINE GLUCONATE 4 % EX LIQD: 1.0000 "application " | Freq: Once | CUTANEOUS | Status: DC

## 2016-03-03 MED ORDER — KETOROLAC TROMETHAMINE 30 MG/ML IJ SOLN
30.0000 mg | Freq: Four times a day (QID) | INTRAMUSCULAR | Status: DC
Start: 2016-03-03 — End: 2016-03-04
  Administered 2016-03-03 – 2016-03-04 (×3): 30 mg via INTRAVENOUS
  Filled 2016-03-03 (×3): qty 1

## 2016-03-03 MED ORDER — DIPHENHYDRAMINE HCL 12.5 MG/5ML PO ELIX: 12.5000 mg | ORAL_SOLUTION | Freq: Four times a day (QID) | ORAL | Status: DC | PRN

## 2016-03-03 MED ORDER — LIDOCAINE HCL (CARDIAC) 20 MG/ML IV SOLN
INTRAVENOUS | Status: DC | PRN
Start: 2016-03-03 — End: 2016-03-03
  Administered 2016-03-03: 100 mg via INTRAVENOUS

## 2016-03-03 MED ORDER — SODIUM CHLORIDE 0.9 % IJ SOLN
INTRAMUSCULAR | Status: AC
  Administered 2016-03-03: 3 mL
  Filled 2016-03-03: qty 10

## 2016-03-03 MED ORDER — LIDOCAINE-EPINEPHRINE 0.5 %-1:200000 IJ SOLN
INTRAMUSCULAR | Status: AC
  Filled 2016-03-03: qty 1

## 2016-03-03 MED ORDER — MORPHINE SULFATE (PF) 2 MG/ML IV SOLN
2.0000 mg | INTRAVENOUS | Status: DC | PRN
  Administered 2016-03-03 (×2): 2 mg via INTRAVENOUS
  Filled 2016-03-03 (×2): qty 1

## 2016-03-03 MED ORDER — HYDROMORPHONE HCL 1 MG/ML IJ SOLN
INTRAMUSCULAR | Status: AC
  Filled 2016-03-03: qty 1

## 2016-03-03 MED ORDER — BUPIVACAINE-EPINEPHRINE 0.5% -1:200000 IJ SOLN
INTRAMUSCULAR | Status: DC | PRN
  Administered 2016-03-03: 30 mL

## 2016-03-03 MED ORDER — HEPARIN SODIUM (PORCINE) 5000 UNIT/ML IJ SOLN
5000.0000 [IU] | Freq: Three times a day (TID) | INTRAMUSCULAR | Status: DC
  Administered 2016-03-03 – 2016-03-08 (×14): 5000 [IU] via SUBCUTANEOUS
  Filled 2016-03-03 (×14): qty 1

## 2016-03-03 SURGICAL SUPPLY — 55 items
APPLIER CLIP 11 MED OPEN (CLIP) ×3
APPLIER CLIP 13 LRG OPEN (CLIP) ×3
BLADE CLIPPER SURG (BLADE) ×3 IMPLANT
BUR EGG 5.0 (BLADE) ×2 IMPLANT
BUR EGG 5.0MM (BLADE) ×1
CANISTER SUCT 1200ML W/VALVE (MISCELLANEOUS) ×3 IMPLANT
CLIP APPLIE 11 MED OPEN (CLIP) ×1 IMPLANT
CLIP APPLIE 13 LRG OPEN (CLIP) ×1 IMPLANT
CNTNR SPEC 2.5X3XGRAD LEK (MISCELLANEOUS) ×1
CONT SPEC 4OZ STER OR WHT (MISCELLANEOUS) ×2
CONTAINER SPEC 2.5X3XGRAD LEK (MISCELLANEOUS) ×1 IMPLANT
DECANTER SPIKE VIAL GLASS SM (MISCELLANEOUS) ×3 IMPLANT
DRAPE INCISE IOBAN 66X45 STRL (DRAPES) ×3 IMPLANT
ELECT CAUTERY BLADE 6.4 (BLADE) ×3 IMPLANT
GAUZE SPONGE 4X4 12PLY STRL (GAUZE/BANDAGES/DRESSINGS) ×3 IMPLANT
GELPORT LAPAROSCOPIC (MISCELLANEOUS) ×3 IMPLANT
GLOVE BIO SURGEON STRL SZ7 (GLOVE) ×6 IMPLANT
GOWN STRL REUS W/ TWL LRG LVL3 (GOWN DISPOSABLE) ×4 IMPLANT
GOWN STRL REUS W/TWL LRG LVL3 (GOWN DISPOSABLE) ×8
HANDLE SUCTION POOLE (INSTRUMENTS) ×2 IMPLANT
HANDLE YANKAUER SUCT BULB TIP (MISCELLANEOUS) ×3 IMPLANT
LIQUID BAND (GAUZE/BANDAGES/DRESSINGS) ×6 IMPLANT
NEEDLE HYPO 22GX1.5 SAFETY (NEEDLE) ×3 IMPLANT
NS IRRIG 1000ML POUR BTL (IV SOLUTION) ×3 IMPLANT
PACK COLON CLEAN CLOSURE (MISCELLANEOUS) ×3 IMPLANT
PACK LAP CHOLECYSTECTOMY (MISCELLANEOUS) ×3 IMPLANT
PAD GROUND ADULT SPLIT (MISCELLANEOUS) ×3 IMPLANT
PENCIL ELECTRO HAND CTR (MISCELLANEOUS) ×3 IMPLANT
RELOAD BLUE (STAPLE) ×6 IMPLANT
RELOAD PROXIMATE 75MM BLUE (ENDOMECHANICALS) ×9 IMPLANT
RELOAD STAPLER WHITE 60MM (STAPLE) ×1 IMPLANT
RETRACTOR WOUND ALXS 18CM MED (MISCELLANEOUS) ×1 IMPLANT
RTRCTR WOUND ALEXIS O 18CM MED (MISCELLANEOUS) ×3
SHEARS HARMONIC ACE PLUS 36CM (ENDOMECHANICALS) ×3 IMPLANT
SLEEVE ENDOPATH XCEL 5M (ENDOMECHANICALS) ×3 IMPLANT
SPONGE LAP 18X18 5 PK (GAUZE/BANDAGES/DRESSINGS) ×6 IMPLANT
STAPLE ECHEON FLEX 60 POW ENDO (STAPLE) ×3 IMPLANT
STAPLER PROXIMATE 75MM BLUE (STAPLE) ×3 IMPLANT
STAPLER RELOAD WHITE 60MM (STAPLE) ×3
SUCTION POOLE HANDLE (INSTRUMENTS) ×6
SUT MNCRL 4-0 (SUTURE) ×4
SUT MNCRL 4-0 27XMFL (SUTURE) ×2
SUT PDS AB 0 CT1 27 (SUTURE) ×6 IMPLANT
SUT PDS AB 1 TP1 54 (SUTURE) ×3 IMPLANT
SUT SILK 2 0 (SUTURE) ×2
SUT SILK 2 0 SH CR/8 (SUTURE) ×3 IMPLANT
SUT SILK 2-0 30XBRD TIE 12 (SUTURE) ×1 IMPLANT
SUT SILK 3-0 (SUTURE) ×6 IMPLANT
SUT VICRYL 0 AB UR-6 (SUTURE) ×6 IMPLANT
SUTURE MNCRL 4-0 27XMF (SUTURE) ×2 IMPLANT
TOWEL OR 17X26 4PK STRL BLUE (TOWEL DISPOSABLE) ×3 IMPLANT
TROCAR XCEL BLUNT TIP 100MML (ENDOMECHANICALS) ×3 IMPLANT
TROCAR XCEL NON-BLD 11X100MML (ENDOMECHANICALS) ×3 IMPLANT
TROCAR XCEL NON-BLD 5MMX100MML (ENDOMECHANICALS) ×3 IMPLANT
TUBING INSUF HEATED (TUBING) ×3 IMPLANT

## 2016-03-03 NOTE — Anesthesia Preprocedure Evaluation (Signed)
Anesthesia Evaluation  Patient identified by MRN, date of birth, ID band Patient awake    Reviewed: Allergy & Precautions, NPO status , Patient's Chart, lab work & pertinent test results, reviewed documented beta blocker date and time   History of Anesthesia Complications (+) PONV and history of anesthetic complications  Airway Mallampati: III  TM Distance: <3 FB Neck ROM: Full    Dental  (+) Chipped, Caps   Pulmonary neg pulmonary ROS,    Pulmonary exam normal breath sounds clear to auscultation       Cardiovascular hypertension, Pt. on medications and Pt. on home beta blockers + Peripheral Vascular Disease  Normal cardiovascular exam     Neuro/Psych negative neurological ROS  negative psych ROS   GI/Hepatic hiatal hernia, GERD  Medicated and Controlled,  Endo/Other  diabetes, Well Controlled, Type 2, Oral Hypoglycemic Agents  Renal/GU Renal InsufficiencyRenal disease  negative genitourinary   Musculoskeletal  (+) Arthritis , Osteoarthritis,    Abdominal Normal abdominal exam  (+)   Peds negative pediatric ROS (+)  Hematology  (+) anemia ,   Anesthesia Other Findings   Reproductive/Obstetrics                             Anesthesia Physical Anesthesia Plan  ASA: III  Anesthesia Plan: General   Post-op Pain Management:    Induction: Intravenous, Rapid sequence and Cricoid pressure planned  Airway Management Planned: Oral ETT  Additional Equipment:   Intra-op Plan:   Post-operative Plan: Extubation in OR  Informed Consent: I have reviewed the patients History and Physical, chart, labs and discussed the procedure including the risks, benefits and alternatives for the proposed anesthesia with the patient or authorized representative who has indicated his/her understanding and acceptance.   Dental advisory given  Plan Discussed with: CRNA and Surgeon  Anesthesia Plan  Comments:         Anesthesia Quick Evaluation

## 2016-03-03 NOTE — Interval H&P Note (Signed)
History and Physical Interval Note:  02/27/2016 7:20 AM  [name]  has presented today for surgery, with the diagnosis of colon mass  The various methods of treatment have been discussed with the patient and family. After consideration of risks, benefits and other options for treatment, the patient has consented to   as a surgical intervention .  The patient's history has been reviewed, patient examined, no change in status, stable for surgery.  I have reviewed the patient's chart and labs.  Questions were answered to the patient's satisfaction.   Plan for Right Hand assisted hemicolectomy. All questions were answered   Diego F Pabon

## 2016-03-03 NOTE — Transfer of Care (Signed)
Immediate Anesthesia Transfer of Care Note  Patient: [name]  Procedure(s) Performed: Procedure(s) with comments: LAPAROSCOPIC RIGHT COLECTOMY, hand assisted (N/A) - hand  Assisted. Make sure Gelport is Open, I will need lap harmonic, supine and left arm Tuccked. Make sure pt is type and cross for 4 units ( anemia)  Patient Location: PACU  Anesthesia Type:General  Level of Consciousness: awake, alert  and oriented  Airway & Oxygen Therapy: Patient Spontanous Breathing and Patient connected to face mask oxygen  Post-op Assessment: Report given to RN and Post -op Vital signs reviewed and stable  Post vital signs: Reviewed and stable  Last Vitals:  Filed Vitals:   02/23/2016 0613 02/22/2016 1115  BP: 145/70 132/67  Pulse: 72 71  Temp: 36 C 36.2 C  Resp: 18 16    Complications: No apparent anesthesia complications

## 2016-03-03 NOTE — H&P (View-Only) (Signed)
[name] is a 55 year old male recently seen in the hospital symptomatic anemia requiring transfusion secondary to a large cecal mass found on colonoscopy. Pathology results show evidence of adenocarcinoma this was a near obstructing lesion. Another T colon c/w TA. CT scan without contrast show no evidence of metastatic disease, and CEA was normal. He does have hypertension, diabetes and renal insufficiency. He states that he feels much better after he was transfused. His last hemoglobin was 8.2. He reports some normal stool with some occasional melena. Overall he feels better and with more energy. Only abdominal operation and its a laparoscopic cholecystectomy Today he is accompanied by his best friend. 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 Discussed with him in detail about this disease process. The need for adjuvant chemotherapy indicates he has no positive disease. I have discussed in detail with him about the operation will start with a hand-assisted laparoscopic colectomy possible open. Risk, benefits and possible complications including but not limited to: Bleeding, blood transfusion, re-interventions, anastomotic leak, abscess, exacerbation of renal failure requiring dialysis. He understands and wishes to proceed. We will also make sure that he sees anesthesia for a preop visit. Continue to hold aspirin and continue iron supplements. Extensive counseling provided and more than 40 minutes was spent in this encounter with the majority of time allocated to face-to-face.

## 2016-03-03 NOTE — Op Note (Signed)
PROCEDURES:  1. Laparoscopic lysis of adhesions 2. 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 Genevive Bi.  Anesthesia: General endotracheal anesthesia  ASA Class: 2   Surgeon: Caroleen Hamman , MD FACS  Anesthesia: Gen. with endotracheal tube   Findings: Near obstructing Right colon cancer  Large Polyp Right colon The Adhesions from the right colon and the small bowel to the abdominal wall and pelvic wall  Estimated Blood Loss: 20 cc         Drains: none         Specimens: R colon       Complications: none                Condition: stable  Procedure Details  The patient was seen again in the Holding Room. The benefits, complications, treatment options, and expected outcomes were discussed with the patient. The risks of bleeding, infection, recurrence of symptoms, failure to resolve symptoms,  bowel injury, any of which could require further surgery were reviewed with the patient.   The patient was taken to Operating Room, identified as [name] and the procedure verified.  A Time Out was held and the above information confirmed.  Prior to the induction of general anesthesia, antibiotic prophylaxis was administered. VTE prophylaxis was in place. General endotracheal anesthesia was then administered and tolerated well. After the induction, the abdomen was prepped with Chloraprep and draped in the sterile fashion. The patient was positioned in the supine position.  A 7 cm midline minilaparotomy incision was created to allow to place the hand GelPort in. We entered the abdominal cavity under direct visualization. The GelPort was placed and and under direct visualization and under direct outpatient I was able to place at 12 mm port in the left upper quadrant and a 5 mm port in the subxiphoid region.  Pneumoperitoneum was  obtained and we started our laparoscopic procedure. We encountered thick adhesions from the small bowel to the abdominal wall and also from the right colon to the abdominal wall this were taken down very careful with harmonica scalpel. Once we have an adequate window and we continued our dissection dividing the white line of [place]. We also were able to take down the hepatic flexure and created a window between the colon and the greater omentum to mobilize the transverse colon. Once we were able to perform a lateral to medial dissection we turned our attention to the ileocolic vessels. We visualized the pedicle and we dissected free from its mesentery and once we had an isolated we divided it with a echelon asker stapler in the standard fashion. We were able to divide the mesentery of the small bowel using the Harmonic scalpel. The terminal ileum was divided with our standard [place] stapler. Also were able to divide the rest of the mesentery using the Harmonic scalpel. Once we have an adequate visualization and mobilization of the right colon and the small bowel and we removed the GelPort and and brought our specimen outside the abdomen. And there was a basic obstructing colon mass. Were able to create our distal margin on the transverse colon. (Though with harmonica scalpel and using the standard GIA low we divided the transverse colon the standard fashion. We removed the specimen and opened the back table. This was a near obstructing colon mass with a large polyp on the right colon.  There was good  mobilization of the small bowel and also the transverse colon we created a side-to-side functional end-to-end stapled anastomosis in the standard fashion with a 75 mm standard GIA stapler. Oh evidence of leak or kink and the edges of the anastomosis where pink and well perfused. We placed a piece of omentum on top of our anastomosis. Were able to drop down the anastomosis into the abdomen. The laparoscopic ports were  removed in the standard fashion and under direct sterilization  We changed gloves and gown and closed the fascia using continues 0 PDS. Excellent thrill was used to perform bilateral blocks under direct visualization and under palpation. The skin was closed with Monocryl in a standard fashion.   Caroleen Hamman, MD, FACS

## 2016-03-03 NOTE — Anesthesia Procedure Notes (Signed)
Date/Time: 02/08/2016 7:55 AM Performed by: [facility] Pre-anesthesia Checklist: Patient identified, Emergency Drugs available, Suction available, Patient being monitored and Timeout performed Patient Re-evaluated:Patient Re-evaluated prior to inductionOxygen Delivery Method: Circle system utilized Preoxygenation: Pre-oxygenation with 100% oxygen Intubation Type: IV induction Ventilation: Two handed mask ventilation required and Mask ventilation with difficulty Laryngoscope Size: Mac and 4 Grade View: Grade II Tube type: Oral Tube size: 7.0 mm Number of attempts: 1 Airway Equipment and Method: Patient positioned with wedge pillow and Stylet Placement Confirmation: positive ETCO2,  CO2 detector and breath sounds checked- equal and bilateral Secured at: 21 cm Tube secured with: Tape Dental Injury: Teeth and Oropharynx as per pre-operative assessment  Difficulty Due To: Difficulty was anticipated Future Recommendations: Recommend- induction with short-acting agent, and alternative techniques readily available Comments: Poor neck ROM, TD < 3cm, poor mouth opening, with cricoid could just see bottom portion of the arytenoids,

## 2016-03-03 NOTE — Anesthesia Postprocedure Evaluation (Signed)
Anesthesia Post Note  Patient: [name]  Procedure(s) Performed: Procedure(s) (LRB): LAPAROSCOPIC RIGHT COLECTOMY, hand assisted (N/A)  Patient location during evaluation: PACU Anesthesia Type: General Level of consciousness: awake and alert and oriented Pain management: pain level controlled Vital Signs Assessment: post-procedure vital signs reviewed and stable Respiratory status: spontaneous breathing Cardiovascular status: blood pressure returned to baseline Anesthetic complications: no    Last Vitals:  Filed Vitals:   02/12/2016 1352 02/27/2016 1429  BP: 142/71 149/67  Pulse: 79 78  Temp: 36.7 C 36.6 C  Resp: 12 17    Last Pain:  Filed Vitals:   02/28/2016 1430  PainSc: Asleep                 [name]

## 2016-03-04 LAB — CBC
HCT: 23.5 % — ABNORMAL LOW (ref 40.0–52.0)
HEMOGLOBIN: 7.4 g/dL — AB (ref 13.0–18.0)
MCH: 24.4 pg — ABNORMAL LOW (ref 26.0–34.0)
MCHC: 31.6 g/dL — AB (ref 32.0–36.0)
MCV: 77.3 fL — ABNORMAL LOW (ref 80.0–100.0)
Platelets: 241 10*3/uL (ref 150–440)
RBC: 3.04 MIL/uL — ABNORMAL LOW (ref 4.40–5.90)
RDW: 17.4 % — AB (ref 11.5–14.5)
WBC: 16.6 10*3/uL — AB (ref 3.8–10.6)

## 2016-03-04 LAB — GLUCOSE, CAPILLARY
GLUCOSE-CAPILLARY: 147 mg/dL — AB (ref 65–99)
GLUCOSE-CAPILLARY: 121 mg/dL — AB (ref 65–99)
GLUCOSE-CAPILLARY: 113 mg/dL — AB (ref 65–99)
GLUCOSE-CAPILLARY: 156 mg/dL — AB (ref 65–99)
GLUCOSE-CAPILLARY: 137 mg/dL — AB (ref 65–99)
Glucose-Capillary: 161 mg/dL — ABNORMAL HIGH (ref 65–99)

## 2016-03-04 LAB — BASIC METABOLIC PANEL
ANION GAP: 4 — AB (ref 5–15)
BUN: 42 mg/dL — AB (ref 6–20)
CALCIUM: 8 mg/dL — AB (ref 8.9–10.3)
CHLORIDE: 107 mmol/L (ref 101–111)
CO2: 22 mmol/L (ref 22–32)
Creatinine, Ser: 4.63 mg/dL — ABNORMAL HIGH (ref 0.61–1.24)
GFR calc Af Amer: 15 mL/min — ABNORMAL LOW (ref >60)
GFR, EST NON AFRICAN AMERICAN: 13 mL/min — AB (ref >60)
Glucose, Bld: 201 mg/dL — ABNORMAL HIGH (ref 65–99)
POTASSIUM: 5.2 mmol/L — AB (ref 3.5–5.1)
Sodium: 133 mmol/L — ABNORMAL LOW (ref 135–145)

## 2016-03-04 LAB — PREPARE RBC (CROSSMATCH)

## 2016-03-04 LAB — HEMOGLOBIN: Hemoglobin: 8.7 g/dL — ABNORMAL LOW (ref 13.0–18.0)

## 2016-03-04 MED ORDER — LABETALOL HCL 100 MG PO TABS
50.0000 mg | ORAL_TABLET | Freq: Every day | ORAL | Status: DC
  Filled 2016-03-04: qty 0.5

## 2016-03-04 MED ORDER — SODIUM CHLORIDE 0.9 % IV SOLN
Freq: Once | INTRAVENOUS | Status: AC
  Administered 2016-03-04: 14:00:00 via INTRAVENOUS

## 2016-03-04 MED ORDER — SODIUM CHLORIDE 0.9 % IV SOLN
Freq: Once | INTRAVENOUS | Status: AC
  Administered 2016-03-04: 15:00:00 via INTRAVENOUS

## 2016-03-04 MED ORDER — LACTATED RINGERS IV BOLUS (SEPSIS)
1000.0000 mL | Freq: Once | INTRAVENOUS | Status: AC
  Administered 2016-03-04: 1000 mL via INTRAVENOUS

## 2016-03-04 NOTE — Progress Notes (Signed)
Patient visited and discussed with Dr. Perrin Maltese no examination was performed this morning I will be following the patient in the hospital and we will assist incorporating care.

## 2016-03-04 NOTE — Progress Notes (Signed)
Checked on patient. Discussed with RN. Patient was describing bilateral lower extremity spasms without pain he's had no further abdominal pain and no nausea or vomiting at this point but he does feel dizzy and somewhat odd  Pressures, up and with the Foley catheter he has some urine output which is dark. Abdomen is soft nondistended nontympanitic and nontender wounds are healing without erythema or drainage calves are nontender negative Homans sign bilaterally no edema no tenderness  Transfusion in progress at this point.  Patient doing very well with transfusion in place my concern is that with this mild hypotension that the patient experienced and his tenuous renal function that could worsen today or overnight and will need to keep his volume optimized.

## 2016-03-04 NOTE — Progress Notes (Signed)
Subjective:  Patient known to our practice from previous admission in October 2016 This time,he underwent laparoscopic right hemicolectomy and lysis of adhesions under general anesthesia for her near obstructing right colon cancer,  Large polyp right colon and adhesions from right colon and the small bowel to the abdominal wall and pelvic wall  Postoperatively, he is doing fair He is able to tolerate clears States that his pain is controlled Yesterday, he reported nausea and vomiting His baseline creatinine appears to be 3.2/ GFR 20 noted last fall This time, admission creatinine was 3.88/GFR 16 Which may be his new baseline  Today's creatinine has increased to 4.63/GFR 13   The cause for recent increase in creatinine it is not entirely clear but most likely related to ATN from concurrent stress and possible hypotension.  OR records are not available at  this time    Objective:  Vital signs in last 24 hours:  Temp:  [97.4 F (36.3 C)-98.8 F (37.1 C)] 98.3 F (36.8 C) (04/26 0431) Pulse Rate:  [75-82] 75 (04/26 0431) Resp:  [12-17] 17 (04/26 0431) BP: (129-163)/(58-78) 129/60 mmHg (04/26 0431) SpO2:  [100 %] 100 % (04/26 0431) Weight:  [123.061 kg (271 lb 4.8 oz)] 123.061 kg (271 lb 4.8 oz) (04/26 0430)  Weight change: 0.59 kg (1 lb 4.8 oz) Filed Weights   03/04/2016 0613 03/04/16 0430  Weight: 122.471 kg (270 lb) 123.061 kg (271 lb 4.8 oz)    Intake/Output:    Intake/Output Summary (Last 24 hours) at 03/04/16 1257 Last data filed at 03/04/16 0915  Gross per 24 hour  Intake 2764.9 ml  Output    925 ml  Net 1839.9 ml     Physical Exam: General: No acute distress, laying in the bed  HEENT anicteric  Neck supple  Pulm/lungs Normal effort, clear to auscultation bilaterally  CVS/Heart No rub or gallop, regular  Abdomen:  Surgical scars, scant bowel sounds, soft  Extremities: No peripheral edema  Neurologic: Alert, oriented  Skin: No acute rashes  Access:         Basic Metabolic Panel:   Recent Labs Lab 02/15/2016 1506 03/04/16 0437  NA  --  133*  K  --  5.2*  CL  --  107  CO2  --  22  GLUCOSE  --  201*  BUN  --  42*  CREATININE 3.88* 4.63*  CALCIUM  --  8.0*     CBC:  Recent Labs Lab 03/07/2016 1506 03/04/16 0437  WBC 18.5* 16.6*  HGB 8.9* 7.4*  HCT 28.7* 23.5*  MCV 78.7* 77.3*  PLT 267 241      Microbiology:  Recent Results (from the past 720 hour(s))  Surgical pcr screen     Status: Abnormal   Collection Time: 02/22/2016  5:56 AM  Result Value Ref Range Status   MRSA, PCR NEGATIVE NEGATIVE Final   Staphylococcus aureus POSITIVE (A) NEGATIVE Final    Comment:        The Xpert SA Assay (FDA approved for NASAL specimens in patients over 23 years of age), is one component of a comprehensive surveillance program.  Test performance has been validated by [facility] for patients greater than or equal to 24 year old. It is not intended to diagnose infection nor to guide or monitor treatment.     Coagulation Studies: No results for input(s): LABPROT, INR in the last 72 hours.  Urinalysis: No results for input(s): COLORURINE, LABSPEC, [place], [place], [place], [place], [place], [place], [place], NITRITE, LEUKOCYTESUR  in the last 72 hours.  Invalid input(s): APPERANCEUR    Imaging: No results found.   Medications:   . dextrose 5 % and 0.9% NaCl 125 mL/hr at 03/04/16 0513  . lactated ringers Stopped (02/21/2016 1935)   . acetaminophen  1,000 mg Oral Q6H  . cyclobenzaprine  10 mg Oral TID  . heparin subcutaneous  5,000 Units Subcutaneous Q8H  . insulin aspart  0-5 Units Subcutaneous QHS  . insulin aspart  0-9 Units Subcutaneous TID WC  . labetalol  100 mg Oral BID  . pantoprazole  40 mg Oral Daily   morphine injection, ondansetron **OR** ondansetron (ZOFRAN) IV, oxyCODONE  Assessment/ Plan:  55 y.o. male with hypertension, diabetes, chronic kidney disease with diabetic nephropathy, anemia,  underwent laparoscopic right hemicolectomy and lysis of adhesions under general anesthesia for her near obstructing right colon cancer,  Large polyp right colon and adhesions from right colon and the small bowel to the abdominal wall and pelvic wall on 02/29/2016  1. Acute renal failure and chronic kidney disease stage IV Acute renal failure is likely secondary to ATN Urine output 980 cc reported yesterday Electrolytes and volume status are acceptable.  No acute indication for dialysis at present.  However, we will continue to monitor closely  2. Mild hyperkalemia - Possibly secondary to Ringer lactate which were stopped yesterday - Continue to monitor  3.  Chronic kidney disease stage IV secondary to diabetic nephropathy and hypertension - Baseline Cr 3.75/GFR 17      LOS: 1 [name] 4/26/201712:57 PM

## 2016-03-04 NOTE — Progress Notes (Signed)
Dr. Burt Knack notified of BP 106/57 after bolus. Patient complaining of dizziness and leg spasms. MD notified. Bedrest ordered. Will continue to monitor.

## 2016-03-04 NOTE — Progress Notes (Signed)
POD # 1   Creat increased likely related to intravascular depletion from prep and Brief hypotension from GETA U/O is preserved Hb 7.4 likely hemodilutional AVSS He c/o mild pain  PE NAD Abd: soft, incisions c/di/i no peritonitis  A/P Avoid any NSAIDS Renal consult for AKI OOB Pulm toilet DC foley w strict I/O F/u creat and Hb

## 2016-03-04 NOTE — Progress Notes (Signed)
Dr. Burt Knack notified of nausea, diaphoresis, hemoglobin of 7.4, and  BP 96/52 mmHg  Pulse 78  Temp(Src) 97.8 F (36.6 C) (Oral)  Resp 18  Ht 6' (1.829 m)  Wt 123.061 kg (271 lb 4.8 oz)  BMI 36.79 kg/m2  SpO2 94%. MD order to scan bladder due to no urine output since foley removal at 08:45 AM. Continue to monitor.

## 2016-03-04 NOTE — Progress Notes (Signed)
Called for patient with hypotension and 0 urine output after Foley removed. Bladder scan demonstrated no fluid in the bladder and this was repeated with a separate machine. This all being done by the RN and reported to me.  I visited the patient personally and he was describing no abdominal pain nausea but no emesis but not feeling well over the last hour.  Heart rate is 78 systolic blood pressure 96 and the systolic pressure had been 129. He has had no urine output. Abdomen is soft nondistended nontender wounds are clean without erythema calves are nontender heart rate in the 70s taken personally.  Hemoglobin reviewed drop from mid 8 yesterday to 7.4 today. Patient was transfused 2 weeks ago for bleeding from his tumor.  I discussed with the patient the rationale for transfusion due to his symptoms as well as low hematocrit and hemoglobin. I reviewed the risks of febrile reaction as well as transmissible agents and he understood this as he had been transfused previously. Centigrade proceed. This was discussed with the RN caring for the patient and a bolus of IV lactated Ringer's was ordered as the blood is being prepared.

## 2016-03-05 LAB — CBC WITH DIFFERENTIAL/PLATELET
Basophils Absolute: 0 10*3/uL (ref 0–0.1)
Basophils Relative: 0 %
EOS PCT: 1 %
Eosinophils Absolute: 0.2 10*3/uL (ref 0–0.7)
HEMATOCRIT: 28.1 % — AB (ref 40.0–52.0)
HEMOGLOBIN: 9 g/dL — AB (ref 13.0–18.0)
Lymphocytes Relative: 5 %
Lymphs Abs: 0.9 10*3/uL — ABNORMAL LOW (ref 1.0–3.6)
MCH: 26 pg (ref 26.0–34.0)
MCHC: 32 g/dL (ref 32.0–36.0)
MCV: 81.3 fL (ref 80.0–100.0)
MONOS PCT: 9 %
Monocytes Absolute: 1.6 10*3/uL — ABNORMAL HIGH (ref 0.2–1.0)
NEUTROS PCT: 85 %
Neutro Abs: 14.8 10*3/uL — ABNORMAL HIGH (ref 1.4–6.5)
Platelets: 256 10*3/uL (ref 150–440)
RBC: 3.46 MIL/uL — AB (ref 4.40–5.90)
RDW: 18.4 % — ABNORMAL HIGH (ref 11.5–14.5)
WBC: 17.5 10*3/uL — AB (ref 3.8–10.6)

## 2016-03-05 LAB — TYPE AND SCREEN
ABO/RH(D): A POS
ANTIBODY SCREEN: NEGATIVE
UNIT DIVISION: 0
UNIT DIVISION: 0
Unit division: 0
Unit division: 0

## 2016-03-05 LAB — COMPREHENSIVE METABOLIC PANEL
ALT: 235 U/L — AB (ref 17–63)
AST: 253 U/L — AB (ref 15–41)
Albumin: 3.2 g/dL — ABNORMAL LOW (ref 3.5–5.0)
Alkaline Phosphatase: 70 U/L (ref 38–126)
Anion gap: 10 (ref 5–15)
BILIRUBIN TOTAL: 0.9 mg/dL (ref 0.3–1.2)
BUN: 51 mg/dL — AB (ref 6–20)
CHLORIDE: 110 mmol/L (ref 101–111)
CO2: 17 mmol/L — ABNORMAL LOW (ref 22–32)
CREATININE: 5.37 mg/dL — AB (ref 0.61–1.24)
Calcium: 8.2 mg/dL — ABNORMAL LOW (ref 8.9–10.3)
GFR calc Af Amer: 13 mL/min — ABNORMAL LOW (ref >60)
GFR, EST NON AFRICAN AMERICAN: 11 mL/min — AB (ref >60)
Glucose, Bld: 134 mg/dL — ABNORMAL HIGH (ref 65–99)
Potassium: 5 mmol/L (ref 3.5–5.1)
Sodium: 137 mmol/L (ref 135–145)
Total Protein: 7.2 g/dL (ref 6.5–8.1)

## 2016-03-05 LAB — GLUCOSE, CAPILLARY
GLUCOSE-CAPILLARY: 137 mg/dL — AB (ref 65–99)
GLUCOSE-CAPILLARY: 141 mg/dL — AB (ref 65–99)
Glucose-Capillary: 139 mg/dL — ABNORMAL HIGH (ref 65–99)
Glucose-Capillary: 116 mg/dL — ABNORMAL HIGH (ref 65–99)

## 2016-03-05 LAB — RENAL FUNCTION PANEL
ALBUMIN: 3 g/dL — AB (ref 3.5–5.0)
ANION GAP: 8 (ref 5–15)
BUN: 49 mg/dL — ABNORMAL HIGH (ref 6–20)
CHLORIDE: 111 mmol/L (ref 101–111)
CO2: 16 mmol/L — AB (ref 22–32)
Calcium: 8 mg/dL — ABNORMAL LOW (ref 8.9–10.3)
Creatinine, Ser: 5.21 mg/dL — ABNORMAL HIGH (ref 0.61–1.24)
GFR calc Af Amer: 13 mL/min — ABNORMAL LOW (ref >60)
GFR calc non Af Amer: 12 mL/min — ABNORMAL LOW (ref >60)
GLUCOSE: 150 mg/dL — AB (ref 65–99)
PHOSPHORUS: 4.1 mg/dL (ref 2.5–4.6)
POTASSIUM: 5 mmol/L (ref 3.5–5.1)
Sodium: 135 mmol/L (ref 135–145)

## 2016-03-05 LAB — SODIUM, URINE, RANDOM: SODIUM UR: 15 mmol/L

## 2016-03-05 MED ORDER — SODIUM CHLORIDE 0.9 % IV BOLUS (SEPSIS)
1000.0000 mL | Freq: Once | INTRAVENOUS | Status: AC
  Administered 2016-03-05: 1000 mL via INTRAVENOUS

## 2016-03-05 MED ORDER — ALBUMIN HUMAN 25 % IV SOLN
12.5000 g | Freq: Once | INTRAVENOUS | Status: AC
  Administered 2016-03-05: 12.5 g via INTRAVENOUS
  Filled 2016-03-05: qty 50

## 2016-03-05 NOTE — Clinical Documentation Improvement (Signed)
General Surgery  Can the diagnosis of anemia be further specified?   Acute Blood Loss Anemia, including the suspected or known cause or associated condition(s)  Acute on chronic blood loss anemia, including the suspected or known cause or associated condition(s)  Chronic blood loss anemia, including the suspected or known cause or associated condition(s)  Precipitous drop in Hematocrit, including the suspected or known cause or associated condition(s)  Other  Clinically Undetermined  Document any associated diagnoses/conditions. Please update your documentation within the medical record to reflect your response to this query. Thank you.  Supporting Information:(As per notes) "Transfusion in progress at this point." Order:"Transfuse RBC" Labs: Component     Latest Ref Rng 03/04/2016 03/04/2016 03/04/2016 03/05/2016          4:37 AM  9:31 PM   Hemoglobin     13.0 - 18.0 g/dL 8.9 (L) 7.4 (L) 8.7 (L) 9.0 (L)    Please exercise your independent, professional judgment when responding. A specific answer is not anticipated or expected.  Thank You,  [place] [phone]

## 2016-03-05 NOTE — Progress Notes (Signed)
Subjective:  Patient known to our practice from previous admission in October 2016 He underwent laparoscopic right hemicolectomy and lysis of adhesions under general anesthesia for near obstructing right colon cancer,  Large polyp right colon and adhesions from right colon and the small bowel to the abdominal wall and pelvic wall  Postoperatively, he is doing fair He is able to tolerate clears States that his pain is controlled   His baseline creatinine appears to be 3.2/ GFR 20 noted last fall This time, admission creatinine was 3.88/GFR 16 Which may be his new baseline  Today's creatinine has increased to 4.63->5.37   The cause for recent increase in creatinine it is not entirely clear but most likely related to ATN    Objective:  Vital signs in last 24 hours:  Temp:  [97.9 F (36.6 C)-98.8 F (37.1 C)] 98 F (36.7 C) (04/27 0537) Pulse Rate:  [79-94] 94 (04/27 0537) Resp:  [16-20] 20 (04/27 0537) BP: (98-126)/(52-67) 99/67 mmHg (04/27 0537) SpO2:  [92 %-96 %] 95 % (04/27 0537) Weight:  [124.875 kg (275 lb 4.8 oz)] 124.875 kg (275 lb 4.8 oz) (04/27 0500)  Weight change: 1.814 kg (4 lb) Filed Weights   02/15/2016 0613 03/04/16 0430 03/05/16 0500  Weight: 122.471 kg (270 lb) 123.061 kg (271 lb 4.8 oz) 124.875 kg (275 lb 4.8 oz)    Intake/Output:    Intake/Output Summary (Last 24 hours) at 03/05/16 1351 Last data filed at 03/05/16 0900  Gross per 24 hour  Intake   2221 ml  Output    195 ml  Net   2026 ml     Physical Exam: General: No acute distress, laying in the bed  HEENT anicteric  Neck supple  Pulm/lungs Normal effort, clear to auscultation bilaterally  CVS/Heart No rub or gallop, regular  Abdomen:  Surgical scars, scant bowel sounds, soft  Extremities: No peripheral edema  Neurologic: Alert, oriented  Skin: No acute rashes  Access:    Foley    Basic Metabolic Panel:   Recent Labs Lab 02/17/2016 1506 03/04/16 0437 03/05/16 0118 03/05/16 0508  NA   --  133* 135 137  K  --  5.2* 5.0 5.0  CL  --  107 111 110  CO2  --  22 16* 17*  GLUCOSE  --  201* 150* 134*  BUN  --  42* 49* 51*  CREATININE 3.88* 4.63* 5.21* 5.37*  CALCIUM  --  8.0* 8.0* 8.2*  PHOS  --   --  4.1  --      CBC:  Recent Labs Lab 02/09/2016 1506 03/04/16 0437 03/04/16 2131 03/05/16 0508  WBC 18.5* 16.6*  --  17.5*  NEUTROABS  --   --   --  14.8*  HGB 8.9* 7.4* 8.7* 9.0*  HCT 28.7* 23.5*  --  28.1*  MCV 78.7* 77.3*  --  81.3  PLT 267 241  --  256      Microbiology:  Recent Results (from the past 720 hour(s))  Surgical pcr screen     Status: Abnormal   Collection Time: 03/07/2016  5:56 AM  Result Value Ref Range Status   MRSA, PCR NEGATIVE NEGATIVE Final   Staphylococcus aureus POSITIVE (A) NEGATIVE Final    Comment:        The Xpert SA Assay (FDA approved for NASAL specimens in patients over 68 years of age), is one component of a comprehensive surveillance program.  Test performance has been validated by [facility] for patients greater  than or equal to 7 year old. It is not intended to diagnose infection nor to guide or monitor treatment.     Coagulation Studies: No results for input(s): LABPROT, INR in the last 72 hours.  Urinalysis: No results for input(s): COLORURINE, LABSPEC, PHURINE, GLUCOSEU, HGBUR, BILIRUBINUR, KETONESUR, PROTEINUR, UROBILINOGEN, NITRITE, LEUKOCYTESUR in the last 72 hours.  Invalid input(s): APPERANCEUR    Imaging: No results found.   Medications:   . dextrose 5 % and 0.9% NaCl 125 mL/hr at 03/05/16 0947  . lactated ringers Stopped (03/07/2016 1935)   . acetaminophen  1,000 mg Oral Q6H  . cyclobenzaprine  10 mg Oral TID  . heparin subcutaneous  5,000 Units Subcutaneous Q8H  . insulin aspart  0-5 Units Subcutaneous QHS  . insulin aspart  0-9 Units Subcutaneous TID WC  . pantoprazole  40 mg Oral Daily   morphine injection, ondansetron **OR** ondansetron (ZOFRAN) IV, oxyCODONE  Assessment/ Plan:  55  y.o. male with hypertension, diabetes, chronic kidney disease with diabetic nephropathy, anemia, underwent laparoscopic right hemicolectomy and lysis of adhesions under general anesthesia for her near obstructing right colon cancer,  Large polyp right colon and adhesions from right colon and the small bowel to the abdominal wall and pelvic wall on 02/25/2016  1. Acute renal failure and chronic kidney disease stage IV Acute renal failure is likely secondary to ATN Urine output 270 cc reported yesterday Electrolytes and volume status are acceptable.  No acute indication for dialysis at present.   However, we will continue to monitor closely Continue iv fluids at reduced dose for now  - Would be careful using iv albumin in a patient with oliguria as it can lead to pulmonary edema   2. Mild hyperkalemia - Possibly secondary to Ringer lactate which was stopped  - Continue to monitor  3.  Chronic kidney disease stage IV secondary to diabetic nephropathy and hypertension - Baseline Cr 3.75/GFR 17      LOS: 2 Allison Deshotels 4/27/20171:51 PM

## 2016-03-05 NOTE — Progress Notes (Signed)
Dr Azalee Course notified of pt low urine output. VSS. HGB improved. Orders received to give 1L NS bolus and to run over a few hours. Will give and continue to monitor. Urine output.

## 2016-03-05 NOTE — Plan of Care (Signed)
Problem: Activity: Goal: Risk for activity intolerance will decrease Outcome: Not Progressing Pt on bedrest     Problem: Fluid Volume: Goal: Ability to maintain a balanced intake and output will improve Outcome: Not Progressing Low urine output md aware

## 2016-03-05 NOTE — Progress Notes (Signed)
  Oncology Nurse Navigator Documentation  Navigator Location: [facility] (03/05/16 1400) Navigator Encounter Type: Screening (03/05/16 1400)   Abnormal Finding Date: 02/14/16 (03/05/16 1400) Confirmed Diagnosis Date: 02/18/16 (03/05/16 1400) Surgery Date: 02/09/2016 (03/05/16 1400) Treatment Initiated Date: 02/13/2016 (03/05/16 1400) Patient Visit Type: Inpatient (03/05/16 1400)                    Acuity: Level 2 (03/05/16 1400)   Acuity Level 2: Initial guidance, education and coordination as needed;Educational needs;Ongoing guidance and education throughout treatment as needed (03/05/16 1400)     Time Spent with Patient: 15 (03/05/16 1400)   Based on pathology report Mr Seckinger will need medical oncology referral for further treatment following recovery. Will follow.

## 2016-03-05 NOTE — Progress Notes (Signed)
MD notified of low urine output post bolus. MD will discuss further options with dr. Burt Knack later this am

## 2016-03-05 NOTE — Progress Notes (Addendum)
CC: POD # 2 lap R hemicolectomy  Subjective: 2 units rbc now hb 9. Given crystalloids as well, only 270cc over the last 24 hrs Minimal abdominal pain Feeling weak Passed flatus and taking some sips of clears only Overall feeling a little bit better than yesterday VSS  Objective: Vital signs in last 24 hours: Temp:  [97.8 F (36.6 C)-98.8 F (37.1 C)] 98 F (36.7 C) (04/27 0537) Pulse Rate:  [78-94] 94 (04/27 0537) Resp:  [16-20] 20 (04/27 0537) BP: (96-126)/(52-67) 99/67 mmHg (04/27 0537) SpO2:  [92 %-96 %] 95 % (04/27 0537) Weight:  [124.875 kg (275 lb 4.8 oz)] 124.875 kg (275 lb 4.8 oz) (04/27 0500) Last BM Date: 03/04/2016  Intake/Output from previous day: 04/26 0701 - 04/27 0700 In: 2688.4 [I.V.:2069.4; Blood:619] Out: 270 [Urine:270] Intake/Output this shift:    Physical exam: NAD Chest: CTA NSR Abd: soft, incisions c/d/i. No peritonitis , no infection Non distended Ext: well perfused  Lab Results: CBC   Recent Labs  03/04/16 0437 03/04/16 2131 03/05/16 0508  WBC 16.6*  --  17.5*  HGB 7.4* 8.7* 9.0*  HCT 23.5*  --  28.1*  PLT 241  --  256   BMET  Recent Labs  03/05/16 0118 03/05/16 0508  NA 135 137  K 5.0 5.0  CL 111 110  CO2 16* 17*  GLUCOSE 150* 134*  BUN 49* 51*  CREATININE 5.21* 5.37*  CALCIUM 8.0* 8.2*   PT/INR No results for input(s): LABPROT, INR in the last 72 hours. ABG No results for input(s): PHART, HCO3 in the last 72 hours.  Invalid input(s): PCO2, PO2  Studies/Results: No results found.  Anti-infectives: Anti-infectives    Start     Dose/Rate Route Frequency Ordered Stop   03/05/2016 0251  ertapenem (INVANZ) 1 g in sodium chloride 0.9 % 50 mL IVPB     1 g 100 mL/hr over 30 Minutes Intravenous On call to O.R. 02/11/2016 0251 02/27/2016 [id number]      Assessment/Plan: POD # 2  AKI on CRI rising creat and now oliguric Currently euvolemic but acidotic, may benefit from bicarb drip, will d/w nephrology No need for emergent  HD Check FENA Hold b blocker for AKI and to improve perfusion.  Continue IVF, clears and mobilize today  Caroleen Hamman, MD, [facility]  03/05/2016

## 2016-03-06 LAB — CBC
HCT: 26 % — ABNORMAL LOW (ref 40.0–52.0)
HEMATOCRIT: 26.8 % — AB (ref 40.0–52.0)
HEMOGLOBIN: 8.3 g/dL — AB (ref 13.0–18.0)
HEMOGLOBIN: 8.6 g/dL — AB (ref 13.0–18.0)
MCH: 25.7 pg — ABNORMAL LOW (ref 26.0–34.0)
MCH: 25.3 pg — AB (ref 26.0–34.0)
MCHC: 32 g/dL (ref 32.0–36.0)
MCHC: 31.9 g/dL — AB (ref 32.0–36.0)
MCV: 80.2 fL (ref 80.0–100.0)
MCV: 79.2 fL — ABNORMAL LOW (ref 80.0–100.0)
PLATELETS: 202 10*3/uL (ref 150–440)
Platelets: 218 10*3/uL (ref 150–440)
RBC: 3.25 MIL/uL — ABNORMAL LOW (ref 4.40–5.90)
RBC: 3.39 MIL/uL — AB (ref 4.40–5.90)
RDW: 18.7 % — AB (ref 11.5–14.5)
RDW: 18.6 % — ABNORMAL HIGH (ref 11.5–14.5)
WBC: 19.4 10*3/uL — ABNORMAL HIGH (ref 3.8–10.6)
WBC: 18.6 10*3/uL — ABNORMAL HIGH (ref 3.8–10.6)

## 2016-03-06 LAB — RENAL FUNCTION PANEL
ALBUMIN: 2.9 g/dL — AB (ref 3.5–5.0)
ANION GAP: 10 (ref 5–15)
BUN: 56 mg/dL — AB (ref 6–20)
CALCIUM: 8 mg/dL — AB (ref 8.9–10.3)
CO2: 13 mmol/L — ABNORMAL LOW (ref 22–32)
CREATININE: 5.29 mg/dL — AB (ref 0.61–1.24)
Chloride: 108 mmol/L (ref 101–111)
GFR calc Af Amer: 13 mL/min — ABNORMAL LOW (ref >60)
GFR calc non Af Amer: 11 mL/min — ABNORMAL LOW (ref >60)
GLUCOSE: 147 mg/dL — AB (ref 65–99)
PHOSPHORUS: 4.6 mg/dL (ref 2.5–4.6)
Potassium: 5.1 mmol/L (ref 3.5–5.1)
SODIUM: 131 mmol/L — AB (ref 135–145)

## 2016-03-06 LAB — GLUCOSE, CAPILLARY
GLUCOSE-CAPILLARY: 120 mg/dL — AB (ref 65–99)
GLUCOSE-CAPILLARY: 115 mg/dL — AB (ref 65–99)
GLUCOSE-CAPILLARY: 113 mg/dL — AB (ref 65–99)
Glucose-Capillary: 108 mg/dL — ABNORMAL HIGH (ref 65–99)

## 2016-03-06 LAB — CALCIUM / CREATININE RATIO, URINE
Calcium, Ur: 0.9 mg/dL
Calcium/Creat.Ratio: 4 mg/g creat (ref 0–260)
Creatinine, Urine: 239.8 mg/dL

## 2016-03-06 MED ORDER — SODIUM BICARBONATE 650 MG PO TABS
1300.0000 mg | ORAL_TABLET | Freq: Two times a day (BID) | ORAL | Status: DC
  Administered 2016-03-06 – 2016-03-07 (×4): 1300 mg via ORAL
  Filled 2016-03-06 (×4): qty 2

## 2016-03-06 NOTE — Progress Notes (Signed)
3 Days Post-Op  Subjective: This was laparoscopic right colon resection for tumor. He feels better today as no further dizziness and no nausea or vomiting. He is passing gas and had a bowel movement. He is tolerating a clear liquid diet. Require transfusion 2 days ago for hypotension and low hemoglobin.  He also has chronic renal failure and an acute on chronic process.  Objective: Vital signs in last 24 hours: Temp:  [97.5 F (36.4 C)-98.1 F (36.7 C)] 97.7 F (36.5 C) (04/28 0525) Pulse Rate:  [81-84] 81 (04/28 0525) Resp:  [20-22] 22 (04/28 0525) BP: (104-138)/(63-71) 128/70 mmHg (04/28 0525) SpO2:  [93 %-96 %] 93 % (04/28 0525) Weight:  [278 lb (126.1 kg)] 278 lb (126.1 kg) (04/28 0429) Last BM Date: 03/05/16  Intake/Output from previous day: 04/27 0701 - 04/28 0700 In: 2663 [P.O.:120; I.V.:2543] Out: 450 [Urine:450] Intake/Output this shift: Total I/O In: 162.5 [I.V.:162.5] Out: 0   Physical exam:  Comfortable-appearing male patient his pallor is much improved. Abdomen is soft and obese minimally distended and minimally tender Calves are nontender  Lab Results: CBC   Recent Labs  03/05/16 0508 03/06/16 0750  WBC 17.5* 19.4*  HGB 9.0* 8.3*  HCT 28.1* 26.0*  PLT 256 202   BMET  Recent Labs  03/05/16 0508 03/06/16 0750  NA 137 131*  K 5.0 5.1  CL 110 108  CO2 17* 13*  GLUCOSE 134* 147*  BUN 51* 56*  CREATININE 5.37* 5.29*  CALCIUM 8.2* 8.0*   PT/INR No results for input(s): LABPROT, INR in the last 72 hours. ABG No results for input(s): PHART, HCO3 in the last 72 hours.  Invalid input(s): PCO2, PO2  Studies/Results: No results found.  Anti-infectives: Anti-infectives    Start     Dose/Rate Route Frequency Ordered Stop   02/13/2016 0251  ertapenem (INVANZ) 1 g in sodium chloride 0.9 % 50 mL IVPB     1 g 100 mL/hr over 30 Minutes Intravenous On call to O.R. 02/16/2016 0251 02/22/2016 0808      Assessment/Plan: s/p Procedure(s): LAPAROSCOPIC  RIGHT COLECTOMY, hand assisted   Labs are personally reviewed his creatinine remains elevated and his hemoglobin is fairly stable. We'll continue observation. Advance diet slowly.  Florene Glen, MD, FACS  03/06/2016

## 2016-03-06 NOTE — Plan of Care (Signed)
Problem: Bowel/Gastric: Goal: Will not experience complications related to bowel motility Outcome: Progressing Pt has had bm and has passed gas

## 2016-03-06 NOTE — Progress Notes (Signed)
Subjective:  Patient known to our practice from previous admission in October 2016 He underwent laparoscopic right hemicolectomy and lysis of adhesions under general anesthesia for near obstructing right colon cancer,  Large polyp right colon and adhesions from right colon and the small bowel to the abdominal wall and pelvic wall   This time, admission creatinine was 3.88/GFR 16 Which may be his new baseline  Creatinine trend is 4.63->5.37->5.29  Postoperatively, he is doing fair He is able to tolerate clears. States yesterday his diet is advanced to soft solids The cause for recent increase in creatinine most likely related to ATN  , as patient became hypotensive in the OR after anesthesia induction  Objective:  Vital signs in last 24 hours:  Temp:  [97.5 F (36.4 C)-98.1 F (36.7 C)] 98.1 F (36.7 C) (04/28 1232) Pulse Rate:  [81-83] 83 (04/28 1232) Resp:  [19-22] 19 (04/28 1232) BP: (125-138)/(69-71) 125/69 mmHg (04/28 1232) SpO2:  [93 %-94 %] 94 % (04/28 1232) Weight:  [126.1 kg (278 lb)] 126.1 kg (278 lb) (04/28 0429)  Weight change: 1.225 kg (2 lb 11.2 oz) Filed Weights   03/04/16 0430 03/05/16 0500 03/06/16 0429  Weight: 123.061 kg (271 lb 4.8 oz) 124.875 kg (275 lb 4.8 oz) 126.1 kg (278 lb)    Intake/Output:    Intake/Output Summary (Last 24 hours) at 03/06/16 1354 Last data filed at 03/06/16 1228  Gross per 24 hour  Intake 3403.87 ml  Output    450 ml  Net 2953.87 ml     Physical Exam: General: No acute distress, laying in the bed  HEENT anicteric  Neck supple  Pulm/lungs Normal effort, clear to auscultation bilaterally  CVS/Heart No rub or gallop, regular  Abdomen:  Surgical scars, scant bowel sounds, soft  Extremities: No peripheral edema  Neurologic: Alert, oriented  Skin: No acute rashes  Access:    Foley    Basic Metabolic Panel:   Recent Labs Lab 03/02/2016 1506  03/04/16 0437 03/05/16 0118 03/05/16 0508 03/06/16 0750  NA  --   --  133*  135 137 131*  K  --   --  5.2* 5.0 5.0 5.1  CL  --   --  107 111 110 108  CO2  --   --  22 16* 17* 13*  GLUCOSE  --   --  201* 150* 134* 147*  BUN  --   --  42* 49* 51* 56*  CREATININE 3.88*  --  4.63* 5.21* 5.37* 5.29*  CALCIUM  --   < > 8.0* 8.0* 8.2* 8.0*  PHOS  --   --   --  4.1  --  4.6  < > = values in this interval not displayed.   CBC:  Recent Labs Lab 02/28/2016 1506 03/04/16 0437 03/04/16 2131 03/05/16 0508 03/06/16 0750  WBC 18.5* 16.6*  --  17.5* 19.4*  NEUTROABS  --   --   --  14.8*  --   HGB 8.9* 7.4* 8.7* 9.0* 8.3*  HCT 28.7* 23.5*  --  28.1* 26.0*  MCV 78.7* 77.3*  --  81.3 80.2  PLT 267 241  --  256 202      Microbiology:  Recent Results (from the past 720 hour(s))  Surgical pcr screen     Status: Abnormal   Collection Time: 03/07/2016  5:56 AM  Result Value Ref Range Status   MRSA, PCR NEGATIVE NEGATIVE Final   Staphylococcus aureus POSITIVE (A) NEGATIVE Final    Comment:  The Xpert SA Assay (FDA approved for NASAL specimens in patients over 10 years of age), is one component of a comprehensive surveillance program.  Test performance has been validated by [facility] for patients greater than or equal to 57 year old. It is not intended to diagnose infection nor to guide or monitor treatment.     Coagulation Studies: No results for input(s): LABPROT, INR in the last 72 hours.  Urinalysis: No results for input(s): COLORURINE, LABSPEC, PHURINE, GLUCOSEU, HGBUR, BILIRUBINUR, KETONESUR, PROTEINUR, UROBILINOGEN, NITRITE, LEUKOCYTESUR in the last 72 hours.  Invalid input(s): APPERANCEUR    Imaging: No results found.   Medications:   . dextrose 5 % and 0.9% NaCl 75 mL/hr at 03/06/16 0518   . acetaminophen  1,000 mg Oral Q6H  . cyclobenzaprine  10 mg Oral TID  . heparin subcutaneous  5,000 Units Subcutaneous Q8H  . insulin aspart  0-5 Units Subcutaneous QHS  . insulin aspart  0-9 Units Subcutaneous TID WC  . pantoprazole  40 mg  Oral Daily  . sodium bicarbonate  1,300 mg Oral BID   morphine injection, ondansetron **OR** ondansetron (ZOFRAN) IV, oxyCODONE  Assessment/ Plan:  55 y.o. male with hypertension, diabetes, chronic kidney disease with diabetic nephropathy, anemia, underwent laparoscopic right hemicolectomy and lysis of adhesions under general anesthesia for her near obstructing right colon cancer,  Large polyp right colon and adhesions from right colon and the small bowel to the abdominal wall and pelvic wall on 02/26/2016  1. Acute renal failure and chronic kidney disease stage IV Acute renal failure is likely secondary to ATN Urine output 450 cc last 24 hrs Electrolytes and volume status are acceptable.  No acute indication for dialysis at present.   However, we will continue to monitor closely  maintain hemodynamic stability Currently getting D5 normal saline at 75 cc per hour  2. Mild hyperkalemia - Possibly secondary to Ringer lactate which was stopped  - Continue to monitor  3.  Chronic kidney disease stage IV secondary to diabetic nephropathy and hypertension - Baseline Cr 3.75/GFR 17      LOS: 3 Kc Sedlak 4/28/20171:54 PM

## 2016-03-06 NOTE — Progress Notes (Signed)
Pt continues to refuse to get up to the chair stating his back hurts. Pt stated that when 2 nurses got him up to the chair last night that it "pulled my back out". I discussed with patient about calling the doctor to get an xray ordered, pt immediately declined and stated he had "chronic back problems".  PRN pain medication given and pt now sleeping. Will continue to educate patient on importance of walking and getting up to the chair. Villa Herb Rn-BC

## 2016-03-06 NOTE — Progress Notes (Signed)
POD # 3 lap right colon Path d/w pt in detail, gave him the option of consultation from oncology as an inpt, he wishes to wait Case will need to be d in tumor board, I feel that I did not leave any gross disease behing and that the pathology report from full thickness cancer and ulceration was attached to the abdominal wall. I will d/w path in detail about my thought process, ? Re resection vs chemo microstatelltie Instability was sent Clinically improving Anemia from chronic blood loss and renal failure, no need for additional trnafusion Renal failure stable, no need for HD. Now on additional bicarb AVSS oliguric but euvolemic   PE NAD Abd : soft , NT, incisions c/d/i  A/P continue daily renal panels Advance diet as tolerated Keep foley for now may DC in am

## 2016-03-07 LAB — GLUCOSE, CAPILLARY
GLUCOSE-CAPILLARY: 145 mg/dL — AB (ref 65–99)
GLUCOSE-CAPILLARY: 141 mg/dL — AB (ref 65–99)
GLUCOSE-CAPILLARY: 120 mg/dL — AB (ref 65–99)
GLUCOSE-CAPILLARY: 63 mg/dL — AB (ref 65–99)
Glucose-Capillary: 98 mg/dL (ref 65–99)

## 2016-03-07 LAB — BASIC METABOLIC PANEL
Anion gap: 12 (ref 5–15)
BUN: 69 mg/dL — ABNORMAL HIGH (ref 6–20)
CALCIUM: 8.3 mg/dL — AB (ref 8.9–10.3)
CHLORIDE: 107 mmol/L (ref 101–111)
CO2: 13 mmol/L — ABNORMAL LOW (ref 22–32)
CREATININE: 6.33 mg/dL — AB (ref 0.61–1.24)
GFR calc non Af Amer: 9 mL/min — ABNORMAL LOW (ref >60)
GFR, EST AFRICAN AMERICAN: 11 mL/min — AB (ref >60)
Glucose, Bld: 149 mg/dL — ABNORMAL HIGH (ref 65–99)
Potassium: 5.8 mmol/L — ABNORMAL HIGH (ref 3.5–5.1)
SODIUM: 132 mmol/L — AB (ref 135–145)

## 2016-03-07 MED ORDER — SODIUM CHLORIDE 0.9 % IV SOLN
INTRAVENOUS | Status: DC
Start: 2016-03-07 — End: 2016-03-08
  Administered 2016-03-07 – 2016-03-08 (×2): via INTRAVENOUS

## 2016-03-07 NOTE — Consult Note (Signed)
[name]  MRN : [id number]  [name] is a 55 y.o. (1961/08/06) male who presents with chief complaint of No chief complaint on file. [name]  History of Present Illness: Patient is admitted to the hospital with colon cancer s/p resection and has developed acute renal failure.  The patient reports poor appetite and fluid retention.  The patient is critically ill with hyperkalemia. The nephrology service has decided to initiate dialysis at this time, and we are asked to place a temporary dialysis catheter for immediate dialysis use.    Current Facility-Administered Medications  Medication Dose Route Frequency Provider Last Rate Last Dose  . 0.9 %  sodium chloride infusion   Intravenous Continuous Jules Husbands, MD 40 mL/hr at 03/07/16 0806    . acetaminophen (TYLENOL) tablet 1,000 mg  1,000 mg Oral Q6H Diego F Pabon, MD   1,000 mg at 03/07/16 1313  . cyclobenzaprine (FLEXERIL) tablet 10 mg  10 mg Oral TID Hubbard Robinson, MD   10 mg at 03/07/16 0805  . heparin injection 5,000 Units  5,000 Units Subcutaneous Q8H Jules Husbands, MD   5,000 Units at 03/07/16 0557  . insulin aspart (novoLOG) injection 0-5 Units  0-5 Units Subcutaneous QHS Jules Husbands, MD   2 Units at 02/19/2016 2216  . insulin aspart (novoLOG) injection 0-9 Units  0-9 Units Subcutaneous TID WC Jules Husbands, MD   1 Units at 03/07/16 0806  . morphine 2 MG/ML injection 2 mg  2 mg Intravenous Q3H PRN Hubbard Robinson, MD   2 mg at 03/06/16 2210  . ondansetron (ZOFRAN) tablet 4 mg  4 mg Oral Q6H PRN Jules Husbands, MD       Or  . ondansetron [facility]) injection 4 mg  4 mg Intravenous Q6H PRN Jules Husbands, MD   4 mg at 03/05/16 2107  . oxyCODONE (Oxy IR/ROXICODONE) immediate release tablet 10 mg  10 mg Oral Q3H PRN Hubbard Robinson, MD   10 mg at 03/07/16 1313  . pantoprazole (PROTONIX) EC tablet 40 mg  40 mg Oral Daily Jules Husbands, MD   40 mg at 03/07/16 0805  . sodium  bicarbonate tablet 1,300 mg  1,300 mg Oral BID Murlean Iba, MD   1,300 mg at 03/07/16 0805    Past Medical History  Diagnosis Date  . Diabetes mellitus without complication ([place])   . Hyperlipemia   . History of hiatal hernia   . Anemia   . Arthritis   . Chronic kidney disease     kidney stones  . Hypertension     Hospitalized in October for elevated BP. given meds, dizziness when BP goes up  . Complication of anesthesia   . PONV (postoperative nausea and vomiting)     with epidural  . Swelling     of feet and legs  . GERD (gastroesophageal reflux disease)     occasionally    Past Surgical History  Procedure Laterality Date  . Cholecystectomy    . Neck surgery      cervical disc  . Achilles tendon repair Right   . Cataract extraction w/phaco Right 10/09/2015    Procedure: CATARACT EXTRACTION PHACO AND INTRAOCULAR LENS PLACEMENT (IOC);  Surgeon: Leandrew Koyanagi, MD;  Location: [place];  Service: Ophthalmology;  Laterality: Right;  DIABETIC - oral meds  . Colonoscopy with propofol N/A 02/14/2016    Procedure: COLONOSCOPY WITH PROPOFOL;  Surgeon: Grace Blight  Rayann Heman, MD;  Location: [place] ENDOSCOPY;  Service: Endoscopy;  Laterality: N/A;  . Esophagogastroduodenoscopy (egd) with propofol N/A 02/14/2016    Procedure: ESOPHAGOGASTRODUODENOSCOPY (EGD) WITH PROPOFOL;  Surgeon: Josefine Class, MD;  Location: [facility] ENDOSCOPY;  Service: Endoscopy;  Laterality: N/A;  . Laparoscopic right colectomy N/A 03/07/2016    Procedure: LAPAROSCOPIC RIGHT COLECTOMY, hand assisted;  Surgeon: Jules Husbands, MD;  Location: [facility] ORS;  Service: General;  Laterality: N/A;  hand  Assisted. Make sure [place] is Open, I will need lap harmonic, supine and left arm Tuccked. Make sure pt is type and cross for 4 units ( anemia)    Social History Social History  Substance Use Topics  . Smoking status: Never Smoker   . Smokeless tobacco: Never Used  . Alcohol Use: 0.0 oz/week    0 Standard  drinks or equivalent per week     Comment: rarely  No IVDU  Family History Family History  Problem Relation Age of Onset  . Hypertension Other   no bleeding disorders, clotting disorders, or aneurysms  No Known Allergies   REVIEW OF SYSTEMS (Negative unless checked)  Constitutional: [] Weight loss  [] Fever  [] Chills Cardiac: [] Chest pain   [] Chest pressure   [] Palpitations   [] Shortness of breath when laying flat   [] Shortness of breath at rest   [] Shortness of breath with exertion. Vascular:  [] Pain in legs with walking   [] Pain in legs at rest   [] Pain in legs when laying flat   [] Claudication   [] Pain in feet when walking  [] Pain in feet at rest  [] Pain in feet when laying flat   [] History of DVT   [] Phlebitis   [] Swelling in legs   [] Varicose veins   [] Non-healing ulcers Pulmonary:   [] Uses home oxygen   [] Productive cough   [] Hemoptysis   [] Wheeze  [] COPD   [] Asthma Neurologic:  [] Dizziness  [] Blackouts   [] Seizures   [] History of stroke   [] History of TIA  [] Aphasia   [] Temporary blindness   [] Dysphagia   [] Weakness or numbness in arms   [] Weakness or numbness in legs Musculoskeletal:  [] Arthritis   [] Joint swelling   [] Joint pain   [] Low back pain Hematologic:  [] Easy bruising  [] Easy bleeding   [] Hypercoagulable state   [x] Anemic  [] Hepatitis Gastrointestinal:  [] Blood in stool   [] Vomiting blood  [] Gastroesophageal reflux/heartburn   [] Difficulty swallowing. Genitourinary:  [] Chronic kidney disease   [x] Difficult urination  [] Frequent urination  [] Burning with urination   [] Blood in urine Skin:  [] Rashes   [] Ulcers   [] Wounds Psychological:  [] History of anxiety   []  History of major depression.  Unable to obtain the review of systems due to the patient's severe systemic illness and altered mental status.       Physical Examination  Filed Vitals:   03/07/16 0127 03/07/16 0451 03/07/16 0537 03/07/16 1345  BP: 125/77  119/62 149/75  Pulse: 95  88 86  Temp: 97.4 F (36.3 C)   99 F (37.2 C)   TempSrc: Oral  Oral   Resp: 22  20 18   Height:      Weight:  134.265 kg (296 lb)    SpO2: 92%  89% 96%   Body mass index is 40.14 kg/(m^2). Gen: WD/WN Head: [place]/AT, No temporalis wasting Ear/Nose/Throat: Hearing grossly intact, nares w/o erythema or drainage,  Eyes: PERRLA, EOMI.  Neck: Supple, no nuchal rigidity.  No bruit or JVD.  Pulmonary:  Good air movement, clear to auscultation bilaterally.  Cardiac:  RRR, normal S1, S2, no Murmurs, rubs or gallops.  Gastrointestinal: soft, non-tender/non-distended. No guarding/reflex.  Musculoskeletal: M/S 5/5 throughout.  Extremities without ischemic changes.  No deformity or atrophy. moderate Edema in the lower extremities bilaterally Neurologic: A and O x 3, nonfocal Psychiatric: normal affect and mood Dermatologic: No rashes or ulcers noted.   Lymph : No Cervical, Axillary, or Inguinal lymphadenopathy.    CBC Lab Results  Component Value Date   WBC 18.6* 03/06/2016   HGB 8.6* 03/06/2016   HCT 26.8* 03/06/2016   MCV 79.2* 03/06/2016   PLT 218 03/06/2016    BMET    Component Value Date/Time   NA 132* 03/07/2016 1020   K 5.8* 03/07/2016 1020   CL 107 03/07/2016 1020   CO2 13* 03/07/2016 1020   GLUCOSE 149* 03/07/2016 1020   BUN 69* 03/07/2016 1020   CREATININE 6.33* 03/07/2016 1020   CALCIUM 8.3* 03/07/2016 1020   GFRNONAA 9* 03/07/2016 1020   GFRAA 11* 03/07/2016 1020   Estimated Creatinine Clearance: 19.4 mL/min (by C-G formula based on Cr of 6.33).  COAG Lab Results  Component Value Date   INR 1.22 02/15/2016   INR 1.05 08/15/2015    Radiology Ct Abdomen Pelvis Wo Contrast  02/14/2016  CLINICAL DATA:  Right colon near obstructing mass. Pt w significant anemia. 55 y.o. male with a history of chronic IDA (on ferrous sulfate 325mg  daily), CKD stage IV, DM II, HTN, and HLD admitted with a GI bleed and symptomatic anemia. Patient reports several days of increase fatigue, weakness, dyspnea, and  dizziness. Stool may have been black a few days ago per patient. No frank rectal bleeding or hematochezia. EXAM: CT ABDOMEN AND PELVIS WITHOUT CONTRAST TECHNIQUE: Multidetector CT imaging of the abdomen and pelvis was performed following the standard protocol without IV contrast. COMPARISON:  None. FINDINGS: Lung bases:  Clear.  Heart normal in size. Hepatobiliary: Calcification in the right liver lobe. Liver otherwise unremarkable. Gallbladder surgically absent. No bile duct dilation. Spleen, pancreas, adrenal glands:  Unremarkable. Kidneys, ureters, bladder: There no renal masses or stones. No hydronephrosis. Ureters are normal in course and in caliber. Bladder is unremarkable. Lymph nodes: There are several prominent gastrohepatic ligament and retroperitoneal lymph nodes. Largest gastrohepatic ligament node measures 1 cm short axis. Largest retroperitoneal node measures 1 cm short axis. No other adenopathy. Ascites:  None. Gastrointestinal: Possible mass in the right colon. No other evidence of a colonic mass. No evidence of bowel obstruction. There are scattered diverticula along the sigmoid colon. No diverticulitis. Normal stomach and small bowel. Normal appendix visualized. Musculoskeletal: Arthropathic changes of the hip joints. Disc degenerative changes noted along the visualized spine. No osteoblastic or osteolytic lesions. IMPRESSION: 1. No acute findings within the abdomen pelvis. 2. Possible mass in the right colon. No evidence of bowel obstruction or bowel inflammation. 3. Mildly prominent lymph nodes along the gastrohepatic ligament and retroperitoneum, most likely reactive. Electronically Signed   By: Lajean Manes M.D.   On: 02/14/2016 21:03      Assessment/Plan 1. ARF 2. S/p colectomy for colon cancer 3. Hyperkalemia.  Should be improved with HD   We will proceed with temporary dialysis catheter placement at this time.  Risks and benefits discussed with patient and/or family, and the  catheter will be placed to allow immediate initiation of dialysis.  If the patient's renal function does not improve throughout the hospital course, we will be happy to place a tunneled dialysis catheter for long term use prior to discharge.  Artyom Stencel, MD  03/07/2016 1:57 PM

## 2016-03-07 NOTE — Progress Notes (Signed)
Subjective:  Patient known to our practice from previous admission in October 2016 He underwent laparoscopic right hemicolectomy and lysis of adhesions under general anesthesia for near obstructing right colon cancer,  Large polyp right colon and adhesions from right colon and the small bowel to the abdominal wall and pelvic wall   This time, admission creatinine was 3.88/GFR 16  Creatinine trend is 4.63->5.37->5.29->6.33  Postoperatively, he is doing fair He is able to tolerate clears.  diet is advanced to soft solids The cause for recent increase in creatinine most likely related to ATN , as patient became hypotensive in the OR after anesthesia induction Potassium level is high today  Objective:  Vital signs in last 24 hours:  Temp:  [97.4 F (36.3 C)-99 F (37.2 C)] 99 F (37.2 C) (04/29 0537) Pulse Rate:  [88-99] 88 (04/29 0537) Resp:  [20-22] 20 (04/29 0537) BP: (119-168)/(62-77) 119/62 mmHg (04/29 0537) SpO2:  [89 %-92 %] 89 % (04/29 0537) Weight:  [134.265 kg (296 lb)] 134.265 kg (296 lb) (04/29 0451)  Weight change: 8.165 kg (18 lb) Filed Weights   03/05/16 0500 03/06/16 0429 03/07/16 0451  Weight: 124.875 kg (275 lb 4.8 oz) 126.1 kg (278 lb) 134.265 kg (296 lb)    Intake/Output:    Intake/Output Summary (Last 24 hours) at 03/07/16 1253 Last data filed at 03/07/16 0900  Gross per 24 hour  Intake 1280.35 ml  Output    950 ml  Net 330.35 ml     Physical Exam: General: No acute distress, laying in the bed  HEENT anicteric  Neck supple  Pulm/lungs Normal effort, clear to auscultation bilaterally  CVS/Heart No rub or gallop, regular  Abdomen:  Surgical scars, scant bowel sounds, soft  Extremities: No peripheral edema  Neurologic: Alert, oriented  Skin: No acute rashes  Access:         Basic Metabolic Panel:   Recent Labs Lab 03/04/16 0437 03/05/16 0118 03/05/16 0508 03/06/16 0750 03/07/16 1020  NA 133* 135 137 131* 132*  K 5.2* 5.0 5.0 5.1 5.8*   CL 107 111 110 108 107  CO2 22 16* 17* 13* 13*  GLUCOSE 201* 150* 134* 147* 149*  BUN 42* 49* 51* 56* 69*  CREATININE 4.63* 5.21* 5.37* 5.29* 6.33*  CALCIUM 8.0* 8.0* 8.2* 8.0* 8.3*  PHOS  --  4.1  --  4.6  --      CBC:  Recent Labs Lab 03/07/2016 1506 03/04/16 0437 03/04/16 2131 03/05/16 0508 03/06/16 0750 03/06/16 1346  WBC 18.5* 16.6*  --  17.5* 19.4* 18.6*  NEUTROABS  --   --   --  14.8*  --   --   HGB 8.9* 7.4* 8.7* 9.0* 8.3* 8.6*  HCT 28.7* 23.5*  --  28.1* 26.0* 26.8*  MCV 78.7* 77.3*  --  81.3 80.2 79.2*  PLT 267 241  --  256 202 218      Microbiology:  Recent Results (from the past 720 hour(s))  Surgical pcr screen     Status: Abnormal   Collection Time: 03/05/2016  5:56 AM  Result Value Ref Range Status   MRSA, PCR NEGATIVE NEGATIVE Final   Staphylococcus aureus POSITIVE (A) NEGATIVE Final    Comment:        The Xpert SA Assay (FDA approved for NASAL specimens in patients over 60 years of age), is one component of a comprehensive surveillance program.  Test performance has been validated by [facility] for patients greater than or equal to 1  year old. It is not intended to diagnose infection nor to guide or monitor treatment.     Coagulation Studies: No results for input(s): LABPROT, INR in the last 72 hours.  Urinalysis: No results for input(s): COLORURINE, LABSPEC, PHURINE, GLUCOSEU, HGBUR, BILIRUBINUR, KETONESUR, PROTEINUR, UROBILINOGEN, NITRITE, LEUKOCYTESUR in the last 72 hours.  Invalid input(s): APPERANCEUR    Imaging: No results found.   Medications:   . sodium chloride 40 mL/hr at 03/07/16 0806   . acetaminophen  1,000 mg Oral Q6H  . cyclobenzaprine  10 mg Oral TID  . heparin subcutaneous  5,000 Units Subcutaneous Q8H  . insulin aspart  0-5 Units Subcutaneous QHS  . insulin aspart  0-9 Units Subcutaneous TID WC  . pantoprazole  40 mg Oral Daily  . sodium bicarbonate  1,300 mg Oral BID   morphine injection, ondansetron  **OR** ondansetron (ZOFRAN) IV, oxyCODONE  Assessment/ Plan:  55 y.o. male with hypertension, diabetes, chronic kidney disease with diabetic nephropathy, anemia, underwent laparoscopic right hemicolectomy and lysis of adhesions under general anesthesia for her near obstructing right colon cancer,  Large polyp right colon and adhesions from right colon and the small bowel to the abdominal wall and pelvic wall on 02/15/2016  1. Acute renal failure and chronic kidney disease stage IV Acute renal failure is likely secondary to ATN Urine output 450->925 cc last 24 hrs  currently getting NS at 40 cc/hr S Cr and BUN have worsened, potassium has worsened to 5.8   Plan for short treatment of dialysis today to correct potassium WIll get nursing to check bladder scan to make sure there is no urinary retention Discussed R/B/A with patient. He has consented to proceed  2. hyperkalemia  - Cannot give Kayexalate due to recent GI surgery - Definitive treatment with dialysis - 2 hr/2K/ 200/500  3.  Chronic kidney disease stage IV secondary to diabetic nephropathy and hypertension - Baseline Cr 3.75/GFR 17  4. [place] Acidosis Due to renal failure Plan for dialysis and continue Sod Bicarb tabs  Case discussed with Dr Lucky Cowboy and Dr Burt Knack   LOS: [place] 4/29/201712:53 PM

## 2016-03-07 NOTE — Progress Notes (Signed)
Pre dialysis assessment 

## 2016-03-07 NOTE — Progress Notes (Addendum)
Pt currently off unit in dialysis at change of shift; report given to [place], night shift RN

## 2016-03-07 NOTE — Evaluation (Signed)
Physical Therapy Evaluation Patient Details Name: [name] MRN: [id number] DOB: [birth year] Today's Date: 03/07/2016   History of Present Illness  55 yo male with colectomy from malignant neoplasm of ascending colon with laparoscopic removal, now has AKI with renal insufficiency and acidemia.   Clinical Impression  Pt is very weak to get OOB and to walk is unsafe, but may make some significant progress with a little more therapy today.  His plan is to be seen until discharge and progress with RW to try to get him home but then to SNF if not able to maneuver alone.    Follow Up Recommendations SNF (unless we can progress beyond assistance for bed and gait)    Equipment Recommendations  Rolling walker with 5" wheels    Recommendations for Other Services Rehab consult     Precautions / Restrictions Precautions Precautions:  (abd surgery) Precaution Comments: nauseated Restrictions Weight Bearing Restrictions: No      Mobility  Bed Mobility Overal bed mobility: Needs Assistance Bed Mobility: Supine to Sit;Sit to Supine     Supine to sit: Max assist;Mod assist Sit to supine: Mod assist   General bed mobility comments: needs 2 person assist for safety  Transfers Overall transfer level: Needs assistance Equipment used: 1 person hand held assist (IV pole) Transfers: Sit to/from Stand;Stand Pivot Transfers Sit to Stand: Mod assist Stand pivot transfers: Min assist       General transfer comment: cued hand placement  Ambulation/Gait Ambulation/Gait assistance: Min assist Ambulation Distance (Feet): 25 Feet Assistive device: 1 person hand held assist (IV pole with dense cues for mobility due to pt feeling nause) Gait Pattern/deviations: Step-through pattern;Shuffle;Wide base of support Gait velocity: reduced Gait velocity interpretation: Below normal speed for age/gender General Gait Details: guarded with trunk and slow pace  Stairs            Wheelchair  Mobility    Modified Rankin (Stroke Patients Only)       Balance Overall balance assessment: Needs assistance Sitting-balance support: Feet supported Sitting balance-Leahy Scale: Good   Postural control: Posterior lean Standing balance support: Bilateral upper extremity supported Standing balance-Leahy Scale: Fair Standing balance comment: less than fair once moving (light headed)                             Pertinent Vitals/Pain Pain Assessment: Faces Faces Pain Scale: Hurts even more Pain Location: L shoulder and neck Pain Descriptors / Indicators: Grimacing Pain Intervention(s): Monitored during session;Repositioned    Home Living Family/patient expects to be discharged to:: Private residence Living Arrangements: Alone Available Help at Discharge: Other (Comment) (Pt has no arrangement for his care) Type of Home: House Home Access: Level entry     Home Layout: One level;Other (Comment) (one step between levels in his house) Home Equipment: None      Prior Function Level of Independence: Independent               Hand Dominance        Extremity/Trunk Assessment   Upper Extremity Assessment: LUE deficits/detail       LUE Deficits / Details: L shoulder pain and weakness with neck pain no clear history of onset   Lower Extremity Assessment: Overall WFL for tasks assessed      Cervical / Trunk Assessment: Other exceptions (abd surgery with weakness to move)  Communication   Communication: No difficulties  Cognition Arousal/Alertness: Awake/alert Behavior During Therapy: [facility] for  tasks assessed/performed Overall Cognitive Status: Within Functional Limits for tasks assessed                      General Comments General comments (skin integrity, edema, etc.): Pt is not comfortable moving and would benefit from inpt rehab stay but will work toward more mobility to get him home if possible    Exercises        Assessment/Plan     PT Assessment Patient needs continued PT services  PT Diagnosis Difficulty walking   PT Problem List Decreased strength;Decreased range of motion;Decreased activity tolerance;Decreased balance;Decreased mobility;Decreased coordination;Decreased knowledge of use of DME;Decreased safety awareness;Obesity;Decreased skin integrity;Pain  PT Treatment Interventions DME instruction;Gait training;Functional mobility training;Therapeutic activities;Therapeutic exercise;Balance training;Neuromuscular re-education;Patient/family education   PT Goals (Current goals can be found in the Care Plan section) Acute Rehab PT Goals Patient Stated Goal: to get home and be safe alone PT Goal Formulation: With patient Time For Goal Achievement: 03/21/16 Potential to Achieve Goals: Good    Frequency 7X/week   Barriers to discharge Inaccessible home environment;Decreased caregiver support (has no daytime help of friends and family)      Co-evaluation               End of Session Equipment Utilized During Treatment: Other (comment) (IV pole) Activity Tolerance: Patient limited by fatigue;Patient limited by pain;Other (comment) (nausea) Patient left: in bed;with call bell/phone within reach;with bed alarm set Nurse Communication: Mobility status         Time: [id number] PT Time Calculation (min) (ACUTE ONLY): 34 min   Charges:   PT Evaluation $PT Eval Low Complexity: 1 Procedure PT Treatments $Gait Training: 8-22 mins   PT G Codes:        Ramond Dial 31-Mar-2016, 1:09 PM   Mee Hives, PT MS Acute Rehab Dept. Number: [facility] [phone] and [place] [phone]

## 2016-03-07 NOTE — Op Note (Signed)
  OPERATIVE NOTE   PROCEDURE: 1. Ultrasound guidance for vascular access right femoral vein 2. Placement of a 30 cm triple lumen dialysis catheter right femoral vein  PRE-OPERATIVE DIAGNOSIS: 1. Acute renal failure 2. Colon cancer  POST-OPERATIVE DIAGNOSIS: Same  SURGEON: Leotis Pain, MD  ASSISTANT(S): None  ANESTHESIA: local  ESTIMATED BLOOD LOSS: Minimal   FINDING(S): 1. None  SPECIMEN(S): None  INDICATIONS:  Patient is a 55 y.o.male who presents with ARF after a colon resection.  Risks and benefits were discussed, and informed consent was obtained..  DESCRIPTION: After obtaining full informed written consent, the patient was laid flat in the bed. The right groin was sterilely prepped and draped in a sterile surgical field was created. The right femoral vein was visualized with ultrasound and found to be widely patent. It was then accessed under direct guidance without difficulty with a Seldinger needle and a permanent image was recorded. A J-wire was then placed. After skin nick and dilatation, a 30 cm Trialysis dialysis catheter was placed over the wire and the wire was removed. The lumens withdrew dark red nonpulsatile blood and flushed easily with sterile saline. The catheter was secured to the skin with 3 nylon sutures. Sterile dressing was placed.  COMPLICATIONS: None  CONDITION: Stable  Dez Stauffer 03/07/2016 2:00 PM

## 2016-03-07 NOTE — Progress Notes (Signed)
POD # 4 AKI Pending creat Now U/O w significant improvement AVSS No complaints had a good night Passed flatus  PE NAD Abd: soft, minimal incisional tenderness, no peritonitis, incisions c/d/i, no infection  A/P Doing well Main issues is acidemia and AKI on renal insufficiency DC foley Mobilize Advance diet as tolerated DC likely Monday, depending on kidney fx and nephrology recs

## 2016-03-08 ENCOUNTER — Inpatient Hospital Stay: Payer: BLUE CROSS/BLUE SHIELD

## 2016-03-08 DIAGNOSIS — I469 Cardiac arrest, cause unspecified: Secondary | ICD-10-CM

## 2016-03-08 LAB — MRSA PCR SCREENING: MRSA by PCR: NEGATIVE

## 2016-03-08 LAB — CBC WITH DIFFERENTIAL/PLATELET
BAND NEUTROPHILS: 11 %
BASOS ABS: 0 10*3/uL (ref 0–0.1)
BASOS PCT: 0 %
Blasts: 0 %
EOS ABS: 0 10*3/uL (ref 0–0.7)
Eosinophils Relative: 0 %
HEMATOCRIT: 28.9 % — AB (ref 40.0–52.0)
Hemoglobin: 8.8 g/dL — ABNORMAL LOW (ref 13.0–18.0)
LYMPHS ABS: 3.7 10*3/uL — AB (ref 1.0–3.6)
LYMPHS PCT: 11 %
MCH: 25.1 pg — ABNORMAL LOW (ref 26.0–34.0)
MCHC: 30.6 g/dL — AB (ref 32.0–36.0)
MCV: 82 fL (ref 80.0–100.0)
METAMYELOCYTES PCT: 0 %
MONO ABS: 1.7 10*3/uL — AB (ref 0.2–1.0)
MONOS PCT: 5 %
Myelocytes: 0 %
NEUTROS ABS: 27.9 10*3/uL — AB (ref 1.4–6.5)
Neutrophils Relative %: 73 %
OTHER: 0 %
PLATELETS: 342 10*3/uL (ref 150–440)
Promyelocytes Absolute: 0 %
RBC: 3.53 MIL/uL — ABNORMAL LOW (ref 4.40–5.90)
RDW: 19.2 % — AB (ref 11.5–14.5)
WBC: 33.3 10*3/uL — ABNORMAL HIGH (ref 3.8–10.6)
nRBC: 1 /100 WBC — ABNORMAL HIGH

## 2016-03-08 LAB — COMPREHENSIVE METABOLIC PANEL
ALBUMIN: 2.6 g/dL — AB (ref 3.5–5.0)
ALT: 495 U/L — ABNORMAL HIGH (ref 17–63)
ANION GAP: 19 — AB (ref 5–15)
AST: 713 U/L — ABNORMAL HIGH (ref 15–41)
Alkaline Phosphatase: 195 U/L — ABNORMAL HIGH (ref 38–126)
BILIRUBIN TOTAL: 2.4 mg/dL — AB (ref 0.3–1.2)
BUN: 63 mg/dL — ABNORMAL HIGH (ref 6–20)
CALCIUM: 8.7 mg/dL — AB (ref 8.9–10.3)
CO2: 14 mmol/L — ABNORMAL LOW (ref 22–32)
CREATININE: 6.17 mg/dL — AB (ref 0.61–1.24)
Chloride: 103 mmol/L (ref 101–111)
GFR calc Af Amer: 11 mL/min — ABNORMAL LOW (ref >60)
GFR, EST NON AFRICAN AMERICAN: 9 mL/min — AB (ref >60)
Glucose, Bld: 82 mg/dL (ref 65–99)
Potassium: 5.2 mmol/L — ABNORMAL HIGH (ref 3.5–5.1)
SODIUM: 136 mmol/L (ref 135–145)
TOTAL PROTEIN: 6.5 g/dL (ref 6.5–8.1)

## 2016-03-08 LAB — BLOOD GAS, ARTERIAL
ACID-BASE DEFICIT: 15.8 mmol/L — AB (ref 0.0–2.0)
Bicarbonate: 15.8 mEq/L — ABNORMAL LOW (ref 21.0–28.0)
FIO2: 1
O2 SAT: 67.7 %
PATIENT TEMPERATURE: 37
PCO2 ART: 67 mmHg — AB (ref 32.0–48.0)
PEEP/CPAP: 5 cmH2O
PO2 ART: 56 mmHg — AB (ref 83.0–108.0)
Pressure control: 20 cmH2O
RATE: 24 resp/min
pH, Arterial: 6.98 — CL (ref 7.350–7.450)

## 2016-03-08 LAB — PHOSPHORUS: PHOSPHORUS: 9.5 mg/dL — AB (ref 2.5–4.6)

## 2016-03-08 LAB — GLUCOSE, CAPILLARY
Glucose-Capillary: 91 mg/dL (ref 65–99)
Glucose-Capillary: 71 mg/dL (ref 65–99)

## 2016-03-08 LAB — PROTIME-INR
INR: 1.81
Prothrombin Time: 20.9 seconds — ABNORMAL HIGH (ref 11.4–15.0)

## 2016-03-08 LAB — HEPATITIS B SURFACE ANTIGEN: Hepatitis B Surface Ag: NEGATIVE

## 2016-03-08 LAB — LACTIC ACID, PLASMA: LACTIC ACID, VENOUS: 7.4 mmol/L — AB (ref 0.5–2.0)

## 2016-03-08 LAB — TROPONIN I: Troponin I: 0.33 ng/mL — ABNORMAL HIGH (ref <0.031)

## 2016-03-08 LAB — PROCALCITONIN: Procalcitonin: 13.73 ng/mL

## 2016-03-08 LAB — MAGNESIUM: MAGNESIUM: 2.2 mg/dL (ref 1.7–2.4)

## 2016-03-08 MED ORDER — PIPERACILLIN-TAZOBACTAM 3.375 G IVPB
3.3750 g | Freq: Two times a day (BID) | INTRAVENOUS | Status: DC
  Filled 2016-03-08 (×2): qty 50

## 2016-03-08 MED ORDER — LABETALOL HCL 5 MG/ML IV SOLN
20.0000 mg | Freq: Once | INTRAVENOUS | Status: DC
  Filled 2016-03-08 (×2): qty 4

## 2016-03-08 MED ORDER — FUROSEMIDE 10 MG/ML IJ SOLN
80.0000 mg | Freq: Once | INTRAMUSCULAR | Status: AC
  Administered 2016-03-08: 80 mg via INTRAVENOUS

## 2016-03-08 MED ORDER — FENTANYL 2500MCG IN NS 250ML (10MCG/ML) PREMIX INFUSION: 25.0000 ug/h | INTRAVENOUS | Status: DC

## 2016-03-08 MED ORDER — MIDAZOLAM HCL 2 MG/2ML IJ SOLN
INTRAMUSCULAR | Status: AC
  Administered 2016-03-08: 2 mg via INTRAVENOUS
  Filled 2016-03-08: qty 2

## 2016-03-08 MED ORDER — MIDAZOLAM HCL 2 MG/2ML IJ SOLN: 2.0000 mg | INTRAMUSCULAR | Status: DC | PRN

## 2016-03-08 MED ORDER — PANTOPRAZOLE SODIUM 40 MG IV SOLR: 40.0000 mg | Freq: Every day | INTRAVENOUS | Status: DC

## 2016-03-08 MED ORDER — FENTANYL BOLUS VIA INFUSION
50.0000 ug | INTRAVENOUS | Status: DC | PRN
  Filled 2016-03-08: qty 50

## 2016-03-08 MED ORDER — PUREFLOW DIALYSIS SOLUTION: INTRAVENOUS | Status: DC

## 2016-03-08 MED ORDER — SENNOSIDES 8.8 MG/5ML PO SYRP: 5.0000 mL | ORAL_SOLUTION | Freq: Every evening | ORAL | Status: DC | PRN

## 2016-03-08 MED ORDER — BISACODYL 10 MG RE SUPP: 10.0000 mg | Freq: Every day | RECTAL | Status: DC | PRN

## 2016-03-08 MED ORDER — INSULIN ASPART 100 UNIT/ML ~~LOC~~ SOLN: 2.0000 [IU] | SUBCUTANEOUS | Status: DC

## 2016-03-08 MED ORDER — ANTISEPTIC ORAL RINSE SOLUTION (CORINZ)
7.0000 mL | Freq: Four times a day (QID) | OROMUCOSAL | Status: DC
  Filled 2016-03-08 (×4): qty 7

## 2016-03-08 MED ORDER — FENTANYL CITRATE (PF) 100 MCG/2ML IJ SOLN
INTRAMUSCULAR | Status: AC
  Administered 2016-03-08: 50 ug
  Filled 2016-03-08: qty 2

## 2016-03-08 MED ORDER — FUROSEMIDE 10 MG/ML IJ SOLN
INTRAMUSCULAR | Status: AC
  Administered 2016-03-08: 80 mg via INTRAVENOUS
  Filled 2016-03-08: qty 10

## 2016-03-08 MED ORDER — HEPARIN SODIUM (PORCINE) 1000 UNIT/ML DIALYSIS
1000.0000 [IU] | INTRAMUSCULAR | Status: DC | PRN
  Filled 2016-03-08: qty 6

## 2016-03-08 MED ORDER — LACTATED RINGERS IV SOLN
INTRAVENOUS | Status: DC
  Administered 2016-03-08: 50 mL via INTRAVENOUS

## 2016-03-08 MED ORDER — CHLORHEXIDINE GLUCONATE 0.12% ORAL RINSE (MEDLINE KIT)
15.0000 mL | Freq: Two times a day (BID) | OROMUCOSAL | Status: DC
  Filled 2016-03-08 (×3): qty 15

## 2016-03-08 MED ORDER — MIDAZOLAM HCL 2 MG/2ML IJ SOLN
2.0000 mg | INTRAMUSCULAR | Status: DC | PRN
  Administered 2016-03-08: 2 mg via INTRAVENOUS

## 2016-03-08 MED ORDER — FENTANYL CITRATE (PF) 100 MCG/2ML IJ SOLN: 50.0000 ug | Freq: Once | INTRAMUSCULAR | Status: DC

## 2016-03-08 MED ORDER — MIDAZOLAM HCL 2 MG/2ML IJ SOLN
INTRAMUSCULAR | Status: AC
  Administered 2016-03-08: 2 mg
  Filled 2016-03-08: qty 2

## 2016-03-08 MED ORDER — SODIUM BICARBONATE 8.4 % IV SOLN
100.0000 meq | Freq: Once | INTRAVENOUS | Status: DC
  Filled 2016-03-08: qty 100

## 2016-03-08 MED FILL — Medication: Qty: 3 | Status: AC

## 2016-03-09 LAB — BLOOD CULTURE ID PANEL (REFLEXED)
ACINETOBACTER BAUMANNII: NOT DETECTED
CANDIDA ALBICANS: NOT DETECTED
Candida glabrata: NOT DETECTED
Candida krusei: NOT DETECTED
Candida parapsilosis: NOT DETECTED
Candida tropicalis: NOT DETECTED
Carbapenem resistance: NOT DETECTED
ENTEROBACTER CLOACAE COMPLEX: NOT DETECTED
ENTEROBACTERIACEAE SPECIES: NOT DETECTED
ESCHERICHIA COLI: NOT DETECTED
Enterococcus species: DETECTED — AB
Haemophilus influenzae: NOT DETECTED
KLEBSIELLA OXYTOCA: NOT DETECTED
Klebsiella pneumoniae: NOT DETECTED
Listeria monocytogenes: NOT DETECTED
Methicillin resistance: NOT DETECTED
NEISSERIA MENINGITIDIS: NOT DETECTED
PSEUDOMONAS AERUGINOSA: NOT DETECTED
Proteus species: NOT DETECTED
STAPHYLOCOCCUS AUREUS BCID: NOT DETECTED
STREPTOCOCCUS AGALACTIAE: NOT DETECTED
STREPTOCOCCUS PNEUMONIAE: NOT DETECTED
STREPTOCOCCUS SPECIES: NOT DETECTED
Serratia marcescens: NOT DETECTED
Staphylococcus species: NOT DETECTED
Streptococcus pyogenes: NOT DETECTED
Vancomycin resistance: NOT DETECTED

## 2016-03-09 LAB — SURGICAL PATHOLOGY

## 2016-03-09 LAB — PATHOLOGIST SMEAR REVIEW

## 2016-03-09 NOTE — Progress Notes (Signed)
Around 5:45am NT and myself were trying to get patient up to side of bed to try and urinate . Patient was very weak and could not hold himself up very well. We decided to help him lay back down. Once we got patient back in the bed and pulled up we were raising the head of the bed when patient stated " I think I am going to be sick" we handed the patient the green emesis bag and he began to vomit profusely green bile looking emesis. Patient became unresponsive and a code blue was called. CPR was started immediately.patient sent to ICU. Report was given to [place]. Terrial Rhodes

## 2016-03-09 NOTE — Progress Notes (Signed)
Patient transferred to ICU earlier today after PEA arrest on floor, currently intubated, not responsive to verbal stimuli, hypertensive. K 5.2, BUN 63, Creatinine 6.24. Dr. Alva Garnet aware and has seen patient. No vasopressors infusing, LR at 50 ml/hr through right femoral trialysis catheter.  Brother has been updated that patient transferred to CCU.

## 2016-03-09 NOTE — Progress Notes (Signed)
Pt arrived from Med Surg status post code blue on vent. Started on Levophed and Dopamine gtt. Arterial line inserted. Pt tachy on the monitor. NP and surgeon in room with patient. Had dialysis yesterday, Bun/creat and k elevated. Pt aspirated before being intubated.

## 2016-03-09 NOTE — ED Provider Notes (Signed)
[facility]  Department of Emergency Medicine   Code Blue CONSULT NOTE  Chief Complaint: Cardiac arrest/unresponsive   Level V Caveat: Unresponsive  History of present illness: I was contacted by the hospital for a CODE BLUE cardiac arrest upstairs and presented to the patient's bedside.  When I arrived to the room the patient was turned to his side with some bilious emesis coming from his mouth. He was pulseless and unresponsive. He was receiving chest compressions and his first dose of epinephrine. It was reported that the patient had received abdominal surgery and had some renal failure with an elevated potassium. He reports though that he had received dialysis that day. They report that they were sitting him up and he felt as though he had to vomit which is when he coated.  ROS: Unable to obtain, Level V caveat  Scheduled Meds: Continuous Infusions: PRN Meds:. Past Medical History  Diagnosis Date  . Diabetes mellitus without complication ([place])   . Hyperlipemia   . History of hiatal hernia   . Anemia   . Arthritis   . Chronic kidney disease     kidney stones  . Hypertension     Hospitalized in October for elevated BP. given meds, dizziness when BP goes up  . Complication of anesthesia   . PONV (postoperative nausea and vomiting)     with epidural  . Swelling     of feet and legs  . GERD (gastroesophageal reflux disease)     occasionally   Past Surgical History  Procedure Laterality Date  . Cholecystectomy    . Neck surgery      cervical disc  . Achilles tendon repair Right   . Cataract extraction w/phaco Right 10/09/2015    Procedure: CATARACT EXTRACTION PHACO AND INTRAOCULAR LENS PLACEMENT (IOC);  Surgeon: Leandrew Koyanagi, MD;  Location: [place];  Service: Ophthalmology;  Laterality: Right;  DIABETIC - oral meds  . Colonoscopy with propofol N/A 02/14/2016    Procedure: COLONOSCOPY WITH PROPOFOL;  Surgeon: Josefine Class, MD;   Location: [facility] ENDOSCOPY;  Service: Endoscopy;  Laterality: N/A;  . Esophagogastroduodenoscopy (egd) with propofol N/A 02/14/2016    Procedure: ESOPHAGOGASTRODUODENOSCOPY (EGD) WITH PROPOFOL;  Surgeon: Josefine Class, MD;  Location: [facility] ENDOSCOPY;  Service: Endoscopy;  Laterality: N/A;  . Laparoscopic right colectomy N/A 03/02/2016    Procedure: LAPAROSCOPIC RIGHT COLECTOMY, hand assisted;  Surgeon: Jules Husbands, MD;  Location: [facility] ORS;  Service: General;  Laterality: N/A;  hand  Assisted. Make sure [place] is Open, I will need lap harmonic, supine and left arm Tuccked. Make sure pt is type and cross for 4 units ( anemia)   Social History   Social History  . Marital Status: Single    Spouse Name: N/A  . Number of Children: N/A  . Years of Education: N/A   Occupational History  . Not on file.   Social History Main Topics  . Smoking status: Never Smoker   . Smokeless tobacco: Never Used  . Alcohol Use: 0.0 oz/week    0 Standard drinks or equivalent per week     Comment: rarely  . Drug Use: No  . Sexual Activity: Not on file   Other Topics Concern  . Not on file   Social History Narrative   No Known Allergies  Last set of Vital Signs (not current) Filed Vitals:   02/15/16 2211 02/16/16 0522  BP: 157/69 155/74  Pulse: 76 69  Temp: 98.3 F (36.8 C)  98 F (36.7 C)  Resp: 16 17      Physical Exam Gen: unresponsive Cardiovascular: pulseless  Resp: apneic. Breath sounds equal bilaterally with bagging  Abd: mildly distended  Neuro: GCS 3, unresponsive to pain  HEENT: Bilious emesis in the posterior oropharynx., gag reflex absent  Neck: No crepitus  Musculoskeletal: No deformity  Skin: warm  Procedures  INTUBATION Performed by: Charlesetta Ivory P Required items: required blood products, implants, devices, and special equipment available Patient identity confirmed: provided demographic data and hospital-assigned identification number Time out: Immediately prior  to procedure a "time out" was called to verify the correct patient, procedure, equipment, support staff and site/side marked as required. Indications: Cardiac arrest  Intubation method: Glide scope Preoxygenation: BVM Tube Size: 7.5 cuffed Post-procedure assessment: chest rise and ETCO2 monitor Breath sounds: equal and absent over the epigastrium Tube secured by Respiratory Therapy Patient tolerated the procedure well with no immediate complications.  CRITICAL CARE Performed by: Charlesetta Ivory P Total critical care time: 30 min Critical care time was exclusive of separately billable procedures and treating other patients. Critical care was necessary to treat or prevent imminent or life-threatening deterioration. Critical care was time spent personally by me on the following activities: development of treatment plan with patient and/or surrogate as well as nursing, discussions with consultants, evaluation of patient's response to treatment, examination of patient, obtaining history from patient or surrogate, ordering and performing treatments and interventions, ordering and review of laboratory studies, ordering and review of radiographic studies, pulse oximetry and re-evaluation of patient's condition.  Cardiopulmonary Resuscitation (CPR) Procedure Note  Directed/Performed by: Loney Hering I personally directed ancillary staff and/or performed CPR in an effort to regain return of spontaneous circulation and to maintain cardiac, neuro and systemic perfusion.    Medical Decision making  When I arrive to the patient's room he was receiving CPR. He had lots of bilious emesis coming from the back of his mouth. I initially attempted an intubation but was unsuccessful with the Mac blade. Given his history of kidney failure we did give the patient some sodium bicarbonate as well as some calcium chloride. We continued to perform CPR while giving the patient more rounds of sodium bicarbonate,  epinephrine, magnesium, atropine. The patient went from asystole into PEA and eventually achieve return of spontaneous circulation. Please refer to the code sheet for specific medications and specific times. I did contact the surgeon on-call as the patient was under the surgical service. The code was run in conjunction with the intensive care unit nurse practitioner and the hospitalist. The patient's care will be given over to the intensive care unit.  Assessment and Plan  Return of spontaneous circulation Transferred to the intensive care unit    Loney Hering, MD 03-23-16 [phone]

## 2016-03-09 NOTE — Progress Notes (Signed)
Subjective:  Patient known to our practice from previous admission in October 2016 He underwent laparoscopic right hemicolectomy and lysis of adhesions under general anesthesia for near obstructing right colon cancer,  Large polyp right colon and adhesions from right colon and the small bowel to the abdominal wall and pelvic wall   This time, admission creatinine was 3.88/GFR 16  Creatinine trend is 4.63->5.37->5.29->6.33 Patient was dialyzed successfully yesterday evening.  No complications during or after the treatment. Patient was alert oriented and asking questions about the  treatment  Patient has coded 3 times since this AM PEA arrest after patient vomited this AM after an attempt to void but collapsed Low O2 sats, Severe acidosis Critically ill at present   Objective:  Vital signs in last 24 hours:  Temp:  [97.4 F (36.3 C)-98.6 F (37 C)] 97.4 F (36.3 C) (04/30 0540) Pulse Rate:  [79-124] 115 (04/30 0652) Resp:  [0-22] 14 (04/30 0652) BP: (114-177)/(59-88) 164/63 mmHg (04/30 0646) SpO2:  [82 %-100 %] 100 % (04/30 0800) FiO2 (%):  [100 %] 100 % (04/30 0800)  Weight change:  Filed Weights   03/05/16 0500 03/06/16 0429 03/07/16 0451  Weight: 124.875 kg (275 lb 4.8 oz) 126.1 kg (278 lb) 134.265 kg (296 lb)    Intake/Output:    Intake/Output Summary (Last 24 hours) at 2016-04-04 0936 Last data filed at 04/04/2016 0110  Gross per 24 hour  Intake    320 ml  Output      0 ml  Net    320 ml     Physical Exam: General:   HEENT anicteric  Neck supple  Pulm/lungs Normal effort, clear to auscultation bilaterally  CVS/Heart No rub or gallop, regular  Abdomen:  Surgical scars, scant bowel sounds, soft  Extremities: No peripheral edema  Neurologic: Alert, oriented  Skin: No acute rashes  Access:         Basic Metabolic Panel:   Recent Labs Lab 03/05/16 0118 03/05/16 0508 03/06/16 0750 03/07/16 1020 04/04/2016 0704  NA 135 137 131* 132* 136  K 5.0 5.0 5.1  5.8* 5.2*  CL 111 110 108 107 103  CO2 16* 17* 13* 13* 14*  GLUCOSE 150* 134* 147* 149* 82  BUN 49* 51* 56* 69* 63*  CREATININE 5.21* 5.37* 5.29* 6.33* 6.17*  CALCIUM 8.0* 8.2* 8.0* 8.3* 8.7*  MG  --   --   --   --  2.2  PHOS 4.1  --  4.6  --  9.5*     CBC:  Recent Labs Lab 03/04/16 0437 03/04/16 2131 03/05/16 0508 03/06/16 0750 03/06/16 1346 2016/04/04 0704  WBC 16.6*  --  17.5* 19.4* 18.6* 33.3*  NEUTROABS  --   --  14.8*  --   --  27.9*  HGB 7.4* 8.7* 9.0* 8.3* 8.6* 8.8*  HCT 23.5*  --  28.1* 26.0* 26.8* 28.9*  MCV 77.3*  --  81.3 80.2 79.2* 82.0  PLT 241  --  256 202 218 342      Microbiology:  Recent Results (from the past 720 hour(s))  Surgical pcr screen     Status: Abnormal   Collection Time: 02/13/2016  5:56 AM  Result Value Ref Range Status   MRSA, PCR NEGATIVE NEGATIVE Final   Staphylococcus aureus POSITIVE (A) NEGATIVE Final    Comment:        The Xpert SA Assay (FDA approved for NASAL specimens in patients over 48 years of age), is one component of a comprehensive surveillance  program.  Test performance has been validated by [facility] for patients greater than or equal to 87 year old. It is not intended to diagnose infection nor to guide or monitor treatment.     Coagulation Studies:  Recent Labs  03/20/16 0704  LABPROT 20.9*  INR 1.81    Urinalysis: No results for input(s): COLORURINE, LABSPEC, PHURINE, GLUCOSEU, HGBUR, BILIRUBINUR, KETONESUR, PROTEINUR, UROBILINOGEN, NITRITE, LEUKOCYTESUR in the last 72 hours.  Invalid input(s): APPERANCEUR    Imaging: Dg Abd 1 View  03/20/16  CLINICAL DATA:  OG tube placement. EXAM: ABDOMEN - 1 VIEW COMPARISON:  None. FINDINGS: There is prominent gaseous distention of the stomach. There is moderate gaseous distention of the visible portion of the abdominal small bowel. Enteric tube is not appreciated on this exam IMPRESSION: Enteric tube not seen. Prominent gaseous distention of the stomach and  at least moderate distention of the visible portion of the small bowel. Electronically Signed   By: Franki Cabot M.D.   On: 20-Mar-2016 08:16   Dg Chest Port 1 View  Mar 20, 2016  CLINICAL DATA:  Endotracheal tube placement. EXAM: PORTABLE CHEST 1 VIEW COMPARISON:  Chest x-ray dated 07/17/2009. FINDINGS: Endotracheal tube appears well positioned with tip approximately 2 cm above the carina. There is cardiac enlargement, probably accentuated by the supine patient positioning. There is central pulmonary vascular congestion without frank pulmonary edema. Linear opacity at the right lung base is most likely atelectasis. There is elevation of the right hemidiaphragm. Osseous and soft tissue structures about the chest are otherwise unremarkable. IMPRESSION: 1. Endotracheal tube well positioned with tip just above the level of the carina. 2. Cardiomegaly, likely accentuated by the supine patient positioning. 3. Central pulmonary vascular congestion without frank pulmonary edema. 4. Linear opacity at the right lung base, most likely atelectasis, less likely pneumonia or aspiration. Electronically Signed   By: Franki Cabot M.D.   On: 03/20/2016 08:15     Medications:   . fentaNYL infusion INTRAVENOUS    . lactated ringers    . pureflow     . antiseptic oral rinse  7 mL Mouth Rinse QID  . chlorhexidine gluconate (SAGE KIT)  15 mL Mouth Rinse BID  . fentaNYL      . fentaNYL (SUBLIMAZE) injection  50 mcg Intravenous Once  . furosemide      . furosemide  80 mg Intravenous Once  . heparin subcutaneous  5,000 Units Subcutaneous Q8H  . labetalol  20 mg Intravenous Once  . midazolam      . pantoprazole (PROTONIX) IV  40 mg Intravenous Daily  . piperacillin-tazobactam (ZOSYN)  IV  3.375 g Intravenous Q12H  . sodium bicarbonate  100 mEq Intravenous Once  . sodium bicarbonate  1,300 mg Oral BID   bisacodyl, fentaNYL, heparin, midazolam, midazolam, morphine injection, ondansetron **OR** ondansetron (ZOFRAN) IV,  sennosides  Assessment/ Plan:  55 y.o. male with hypertension, diabetes, chronic kidney disease with diabetic nephropathy, anemia, underwent laparoscopic right hemicolectomy and lysis of adhesions under general anesthesia for her near obstructing right colon cancer,  Large polyp right colon and adhesions from right colon and the small bowel to the abdominal wall and pelvic wall on 02/20/2016  1. Acute renal failure and chronic kidney disease stage IV Acute renal failure is likely secondary to ATN Urine output 450->925 cc last 24 hrs  currently getting NS at 40 cc/hr  dialyzed yesterday without complications    2. hyperkalemia  - Cannot give Kayexalate due to recent GI surgery - Definitive  treatment with dialysis - 2 hr/2K/ 200/500  3.  Chronic kidney disease stage IV secondary to diabetic nephropathy and hypertension - Baseline Cr 3.75/GFR 17  4. [place] Acidosis   Due to renal failure and Lactic acidosis Other DDx include PE, MI, wound complications  Emergent dialysis in form of CRRT is ordered  5. Acute resp failure - intubated/vent support at present  Multiple codes  Family updated by Dr Alva Garnet     LOS: [place] 20-May-20179:36 AM

## 2016-03-09 NOTE — Progress Notes (Signed)
Call to [facility], patient will be suitable for eye donation. Patient brother, [name], contact information given. Referral # [id number]

## 2016-03-09 NOTE — Progress Notes (Signed)
PT Cancellation Note  Patient Details Name: [name] MRN: [id number] DOB: 1961/09/01   Cancelled Treatment:     55 yo Male was evaluated yesterday by PT; however this AM patient coded x2 and was transferred to CCU. Due to worsening condition, PT will be discharged at this time. Will need new PT orders once patient is appropriate.    Trotter,Margaret PT, DPT 03/14/16, 9:51 AM

## 2016-03-09 NOTE — Progress Notes (Addendum)
At patient bedside, patient with thick frothy edema in ETT, SATs declining at 83%, patient with no pulse, became hypotensive, CODE BLUE initiated per ACLS protocol. Dr. Alva Garnet and Dr. Burt Knack at bedside. Patient brother also at bedside, who stepped out of room, PEA. [phone] return of pulse, Oximetry 90%, B/P 269/1112, HR 130s.  2nd CODE BLUE at [phone]: again SATs declined, hypotensive, bradycardic to PEA. CPR resumed and ACLS protocol. (+) Pulse at 0928. Dr. Candiss Norse at bedside, order to initiate CRRT at 2 K. Again spoke to patient brother, Elta [place], and updated on patient status.  3rd CODE BLUE at 0947, PEA, CPR iniated, Dr. Candiss Norse at bedside with Dr. Alva Garnet, (+) return of pulse [phone] Dr. Alva Garnet spoke with patient brother, code status changed to DNR. Brother does not want any further CPR or ACLS medications administered if patient loses pulse again.  1010 Patient astolyle on cardiac monitor, confirmed by this RN and Purcell Nails, RN, no heart tone ausculated. Dr. Shawna Orleans and Dr. Burt Knack notified. Patient brother brought in to room again with chaplain.

## 2016-03-09 NOTE — Progress Notes (Signed)
Received a call from hospitalist around 6:15am, PT coded. When talking to the Nurse apparently he was trying to void and felt weak, vomited and then collapsed. Chest compression and epi initiated. BP and pulse returned He was moved to the ICU where he remained tachycardic with good BP. Currently being w/u , etiologies include aspiration, MI, PE or vasovagal response. Will need ICU and further w/u. D/W Critical care in detail. From surgical perspective he was progressing well and I doubt there is a complication related to his right colectomy per se. Down the line being hypotensive our anastomosis may suffer. We will address those issues when they appear.  This was a very unfortunate event.  Procedure NOTE  Preop Dx: shock  Post op Dx : same Placement of Right radial art line   Complications: none  Findings: good pulse and waveform  EBL: minimal  THis was done on emergency basis for monitoring of HD after pt coded. Right forearm and wrist where prepped and draped. A micropunture needle was used to cannulize right radial artery, using a modified seldinger technique the catheter was placed and secure with interrumpted silks. Good wave form

## 2016-03-09 NOTE — Consult Note (Signed)
PULMONARY / CRITICAL CARE MEDICINE   Name: [name] MRN: [id number] DOB: 06/15/1961    ADMISSION DATE:  02/19/2016 CONSULTATION DATE:  2016-03-11   HISTORY OF PRESENT ILLNESS:   I met pt in ICU after PEA arrest that occurred on surgical floor. The hospital course was reviewed and was discussed with Dr Dahlia Byes. The pt was comatose and intubated. The reports was reportedly up to urinate and felt weak, vomited, then arrested. He underwent 20 mins of ACLS until ROSC. While in ICU, he suffered recurrent PEA on 3 occasions. After the 3rd event in the ICU, I spoke with pt's brother and informed him that the events suggested some major catastrophic event such as MI or massive PE. Regardless, I conveyed that the course in the ICU made it clear that this was not an event that he would be able to survive in a way that we would consider a good outcome. The brother understood and stated that pt would not want to survive in a severely incapacitated state. We agreed to no further ACLS. As anticipated, recurrent PEA ensued shortly thereafter. Pt was pronounced by RN as documented  PAST MEDICAL HISTORY :  He  has a past medical history of Diabetes mellitus without complication ([place]); Hyperlipemia; History of hiatal hernia; Anemia; Arthritis; Chronic kidney disease; Hypertension; Complication of anesthesia; PONV (postoperative nausea and vomiting); Swelling; and GERD (gastroesophageal reflux disease).  PAST SURGICAL HISTORY: He  has past surgical history that includes Cholecystectomy; Neck surgery; Achilles tendon repair (Right); Cataract extraction w/PHACO (Right, 10/09/2015); Colonoscopy with propofol (N/A, 02/14/2016); Esophagogastroduodenoscopy (egd) with propofol (N/A, 02/14/2016); and Laparoscopic right colectomy (N/A, 02/20/2016).  No Known Allergies  No current facility-administered medications on file prior to encounter.   Current Outpatient Prescriptions on File Prior to Encounter  Medication Sig  .  BISACODYL 5 MG EC tablet Take 4 tablets at 8AM the day before your surgery.  . ferrous sulfate 325 (65 FE) MG tablet Take 325 mg by mouth daily with breakfast.  . glimepiride (AMARYL) 2 MG tablet Take 2 mg by mouth daily with breakfast.  . labetalol (NORMODYNE) 100 MG tablet Take 1 tablet (100 mg total) by mouth 2 (two) times daily.  [name] losartan (COZAAR) 50 MG tablet Take 1 tablet (50 mg total) by mouth daily.  . polyethylene glycol powder (GLYCOLAX/MIRALAX) powder Take 255 g by mouth once. At 2:00 PM, start Miralax Prep until all is finished.  . sitaGLIPtin (JANUVIA) 100 MG tablet Take 100 mg by mouth daily with breakfast.   . isosorbide mononitrate (IMDUR) 30 MG 24 hr tablet Take 1 tablet by mouth. Reported on 02/23/2016    FAMILY HISTORY:  His has no family status information on file.   SOCIAL HISTORY: He  reports that he has never smoked. He has never used smokeless tobacco. He reports that he drinks alcohol. He reports that he does not use illicit drugs.  REVIEW OF SYSTEMS:   Level 5 caveat  VITAL SIGNS: BP 164/63 mmHg  Pulse 115  Temp(Src) 97.4 F (36.3 C) (Oral)  Resp 14  Ht 6' (1.829 m)  Wt 296 lb (134.265 kg)  BMI 40.14 kg/m2  SpO2 100%  HEMODYNAMICS:    VENTILATOR SETTINGS: Vent Mode:  [-] PRVC FiO2 (%):  [100 %] 100 % Set Rate:  [18 bmp] 18 bmp Vt Set:  [500 mL] 500 mL PEEP:  [5 cmH20] 5 cmH20  INTAKE / OUTPUT: I/O last 3 completed shifts: In: 1211 [I.V.:1211] Out: 600 [Urine:600]  PHYSICAL EXAMINATION:  General: sedated, intubated Neuro: comatose HEENT: NCAT Cardiovascular: reg, tachy Lungs: diffuse coarse rhonchi Abdomen: mildly distended, soft Ext: symmetric 1+ edema Skin: surgical wound on abd well healed  LABS:  BMET  Recent Labs Lab 03/05/16 0508 03/06/16 0750 03/07/16 1020  NA 137 131* 132*  K 5.0 5.1 5.8*  CL 110 108 107  CO2 17* 13* 13*  BUN 51* 56* 69*  CREATININE 5.37* 5.29* 6.33*  GLUCOSE 134* 147* 149*     Electrolytes  Recent Labs Lab 03/05/16 0118 03/05/16 0508 03/06/16 0750 03/07/16 1020  CALCIUM 8.0* 8.2* 8.0* 8.3*  PHOS 4.1  --  4.6  --     CBC  Recent Labs Lab 03/05/16 0508 03/06/16 0750 03/06/16 1346  WBC 17.5* 19.4* 18.6*  HGB 9.0* 8.3* 8.6*  HCT 28.1* 26.0* 26.8*  PLT 256 202 218    Coag's No results for input(s): APTT, INR in the last 168 hours.  Sepsis Markers No results for input(s): LATICACIDVEN, PROCALCITON, O2SATVEN in the last 168 hours.  ABG No results for input(s): PHART, PCO2ART, PO2ART in the last 168 hours.  Liver Enzymes  Recent Labs Lab 03/05/16 0118 03/05/16 0508 03/06/16 0750  AST  --  253*  --   ALT  --  235*  --   ALKPHOS  --  70  --   BILITOT  --  0.9  --   ALBUMIN 3.0* 3.2* 2.9*    Cardiac Enzymes No results for input(s): TROPONINI, PROBNP in the last 168 hours.  Glucose  Recent Labs Lab 03/07/16 0244 03/07/16 0724 03/07/16 1132 03/07/16 1638 03/07/16 2151 2016/03/26 0610  GLUCAP 145* 141* 120* 98 63* 91    Imaging No results found.    ASSESSMENT / PLAN: S/P laparotomy Recurrent PEA arrest X 4  Please see documentation in HPI section. I oversaw the initial attempts to stabilize pt in ICU and oversaw ACLS performed in ICU  Merton Border, MD PCCM service Mobile [phone] Pager [phone] 2016-03-26   26-Mar-2016, 7:12 AM

## 2016-03-09 NOTE — Progress Notes (Signed)
Patient reports that he does not need to urinate. Bladder scan showed 33 ml at the most volume. Patient assisted with the urinal but was unable to urinate. IV fluids restarted at 40 ml/hr. Will reassess in 1 hr. Terrial Rhodes

## 2016-03-09 NOTE — Progress Notes (Signed)
Patient has just experienced a cardiopulmonary arrest the second time today. I was present for the code that was run by the intensivist.  Patient responded to 2 rounds of epinephrine and has regained his pulse and blood pressure but remains in critical condition.  Etiology of this is unclear but presumed to be a pulmonary embolus although CT scan of the chest is contraindicated in this patient with acute renal failure on dialysis.  I had 2 separate discussions with the patient's brother. Brother informs me that they were not close partly due to the age difference. He did not have any knowledge concerning the patient's advanced directives whether or not he had a will or a power of attorney. He plans to discuss this with the patient's closest friends but does not believe that they were power of attorney either. With that in mind the patient will remain a full CODE STATUS but the brother was appraised of the severity of this patient's condition and the likely outcome.

## 2016-03-09 NOTE — Progress Notes (Signed)
After speaking with patient brother, Elta Guadeloupe, Dr. Alva Garnet advised patient would be appropriate for autopsy. Purcell Nails, RN supervisor, called medical examiner Izora Ribas, who advised it would be Monday before he could call [facility] at 0800 for discussion of autopsy. Patient will be transported to morgue with ETT and IVs in place per protocol.

## 2016-03-09 NOTE — Progress Notes (Signed)
Pharmacy Antibiotic Note  [name] is a 55 y.o. male admitted on 03/02/2016 with pneumonia.  Pharmacy has been consulted for Zosyn dosing.  Plan: Zosyn 3.375 gm IV Q12H EI  Height: 6' (182.9 cm) Weight: 296 lb (134.265 kg) IBW/kg (Calculated) : 77.6  Temp (24hrs), Avg:98.1 F (36.7 C), Min:97.4 F (36.3 C), Max:98.6 F (37 C)   Recent Labs Lab 02/14/2016 1506 03/04/16 0437 03/05/16 0118 03/05/16 0508 03/06/16 0750 03/06/16 1346 03/07/16 1020  WBC 18.5* 16.6*  --  17.5* 19.4* 18.6*  --   CREATININE 3.88* 4.63* 5.21* 5.37* 5.29*  --  6.33*    Estimated Creatinine Clearance: 19.4 mL/min (by C-G formula based on Cr of 6.33).    No Known Allergies   Thank you for allowing pharmacy to be a part of this patient's care.  Laural Benes, Pharm.D., BCPS Clinical Pharmacist Mar 29, 2016 7:21 AM

## 2016-03-09 DEATH — deceased

## 2016-03-10 LAB — CULTURE, BLOOD (ROUTINE X 2)

## 2016-03-13 LAB — CULTURE, BLOOD (ROUTINE X 2)

## 2016-04-09 NOTE — Discharge Summary (Signed)
Patient ID: [name] MRN: [id number] DOB/AGE: 1961-04-13 55 y.o.  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 He has significant history of obesity, HTN, DM and chronic renal insufficiency not on hemodialysis. He was admitted for an elective colectomy. He underwent hand-assisted laparoscopic right hemicolectomy with a primary anastomosis. He did okay postoperatively did develop exacerbation of his acute kidney injury that needed to be improved with resuscitation of crystalloids, colloids and also blocked. He is bathing her hemoglobin was around 8 and this was due to the bleeding cancer. From the abdominal perspective he did well and he did show evidence of an increased white count but this was not unusual given his surgery and his transfusion. Renal follow him and actually his acute kidney injury worsened to the point that he require dialysis for him his hyperkalemia. A right groin hemodialysis catheter was placed by the vascular team. His urine output improved degrees and his IMA dynamics where well. He was actually passing gas and tolerating some diet. He was also getting up and about and walking within the room. On the morning of April 30 I received a call around 6 10 in the morning that the patient had coded. The hospitalist team was called and they were able to initiate ACLS protocol. According to the nurse and he got up to void did not feel well and vomiting. Shortly after he collapsed. An ACLS protocol was started and he was intubated and actually good pulse and good hemodynamics were obtained. He was transferred to the intensive care unit where further workup continue. A workup there was an increase in the white  count and also a severe acidemia. The troponins were also elevated which is compounding factor given his renal insufficiency. In the intensive care unit were able to place an NG tube and obtain an a chest and abdominal films showing is no evidence of free air and showing no evidence of any acute abnormality other than a slightly enlarged heart. In the ICU he again coded twice and the last time he was unable to be resuscitated successfully. The last time I saw him was the morning of the 30th around 7:00 in the morning. Dr. Burt Knack did seem several times during the day as well as the critical care team. Unfortunately we were unable to determine the specific cause of his demise. The brother was then next of kin was notified and unfortunately his relationship with Mr. Arbutus Ped was not very close.  The patient was transported to the more port for the disposition. Counseling was provided to the family by Dr. Burt Knack and critical care team.  Consults: Renal, critical care.  Disposition: 20-Expired     Medication List    ASK your doctor about these medications        bisacodyl 5 MG EC tablet  Generic drug:  bisacodyl  Take 4 tablets at 8AM the day before your surgery.     ferrous sulfate 325 (65 FE) MG tablet  Take 325 mg by mouth daily with breakfast.     glimepiride 2 MG tablet  Commonly known as:  AMARYL  Take 2 mg by mouth daily with breakfast.     isosorbide mononitrate 30 MG 24 hr tablet  Commonly known as:  IMDUR  Take  1 tablet by mouth. Reported on 02/28/2016     labetalol 100 MG tablet  Commonly known as:  NORMODYNE  Take 1 tablet (100 mg total) by mouth 2 (two) times daily.     losartan 50 MG tablet  Commonly known as:  COZAAR  Take 1 tablet (50 mg total) by mouth daily.     pantoprazole 40 MG tablet  Commonly known as:  PROTONIX  Take 40 mg by mouth daily.     polyethylene glycol powder powder  Commonly known as:  GLYCOLAX/MIRALAX  Take 255 g by mouth once. At 2:00 PM, start  Miralax Prep until all is finished.     sitaGLIPtin 100 MG tablet  Commonly known as:  JANUVIA  Take 100 mg by mouth daily with breakfast.          Caroleen Hamman, MD FACS

## 2016-10-05 IMAGING — DX DG CHEST 1V PORT
2 series · 2 of 2 positions shown · non-contrast
Comparison: Chest x-ray dated 07/17/2009.

CLINICAL DATA: Endotracheal tube placement.

EXAM:
PORTABLE CHEST 1 VIEW

[chest ap (1 of 2)]
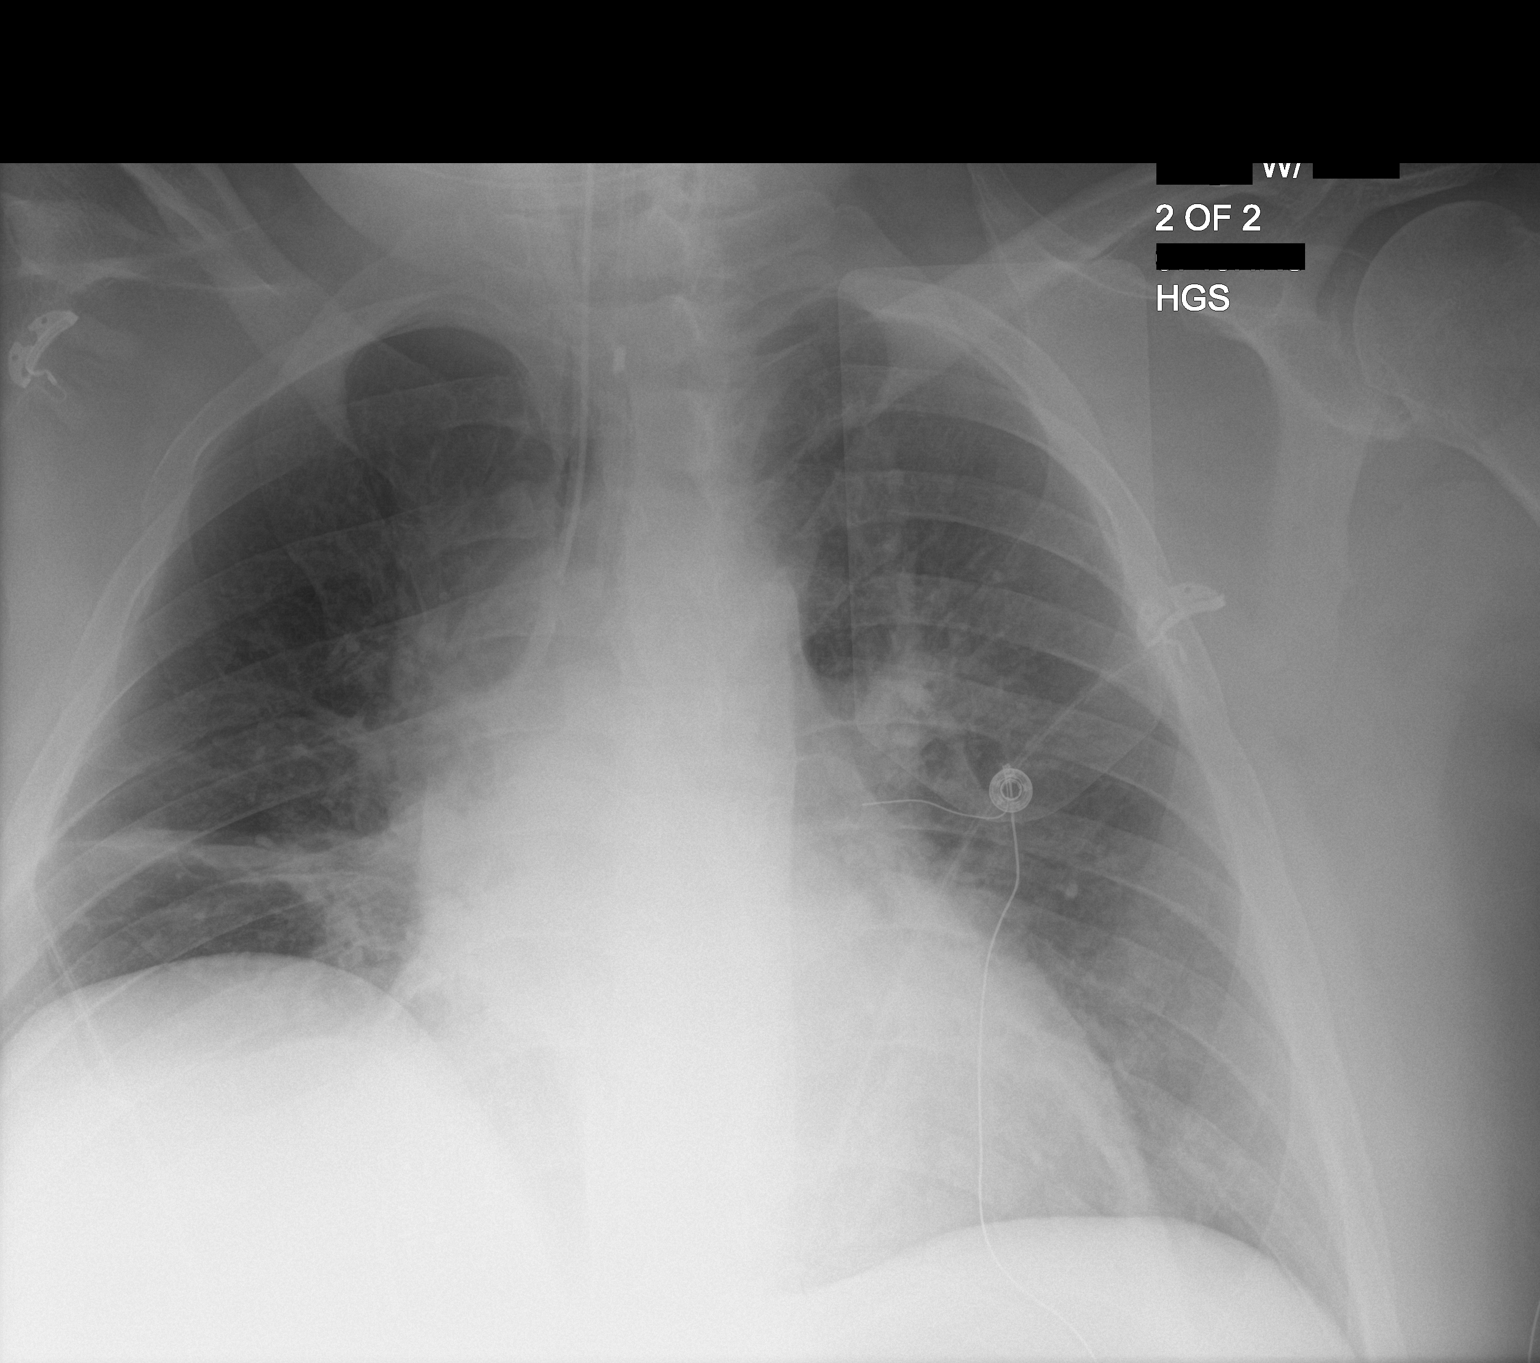

[chest ap (2 of 2)]
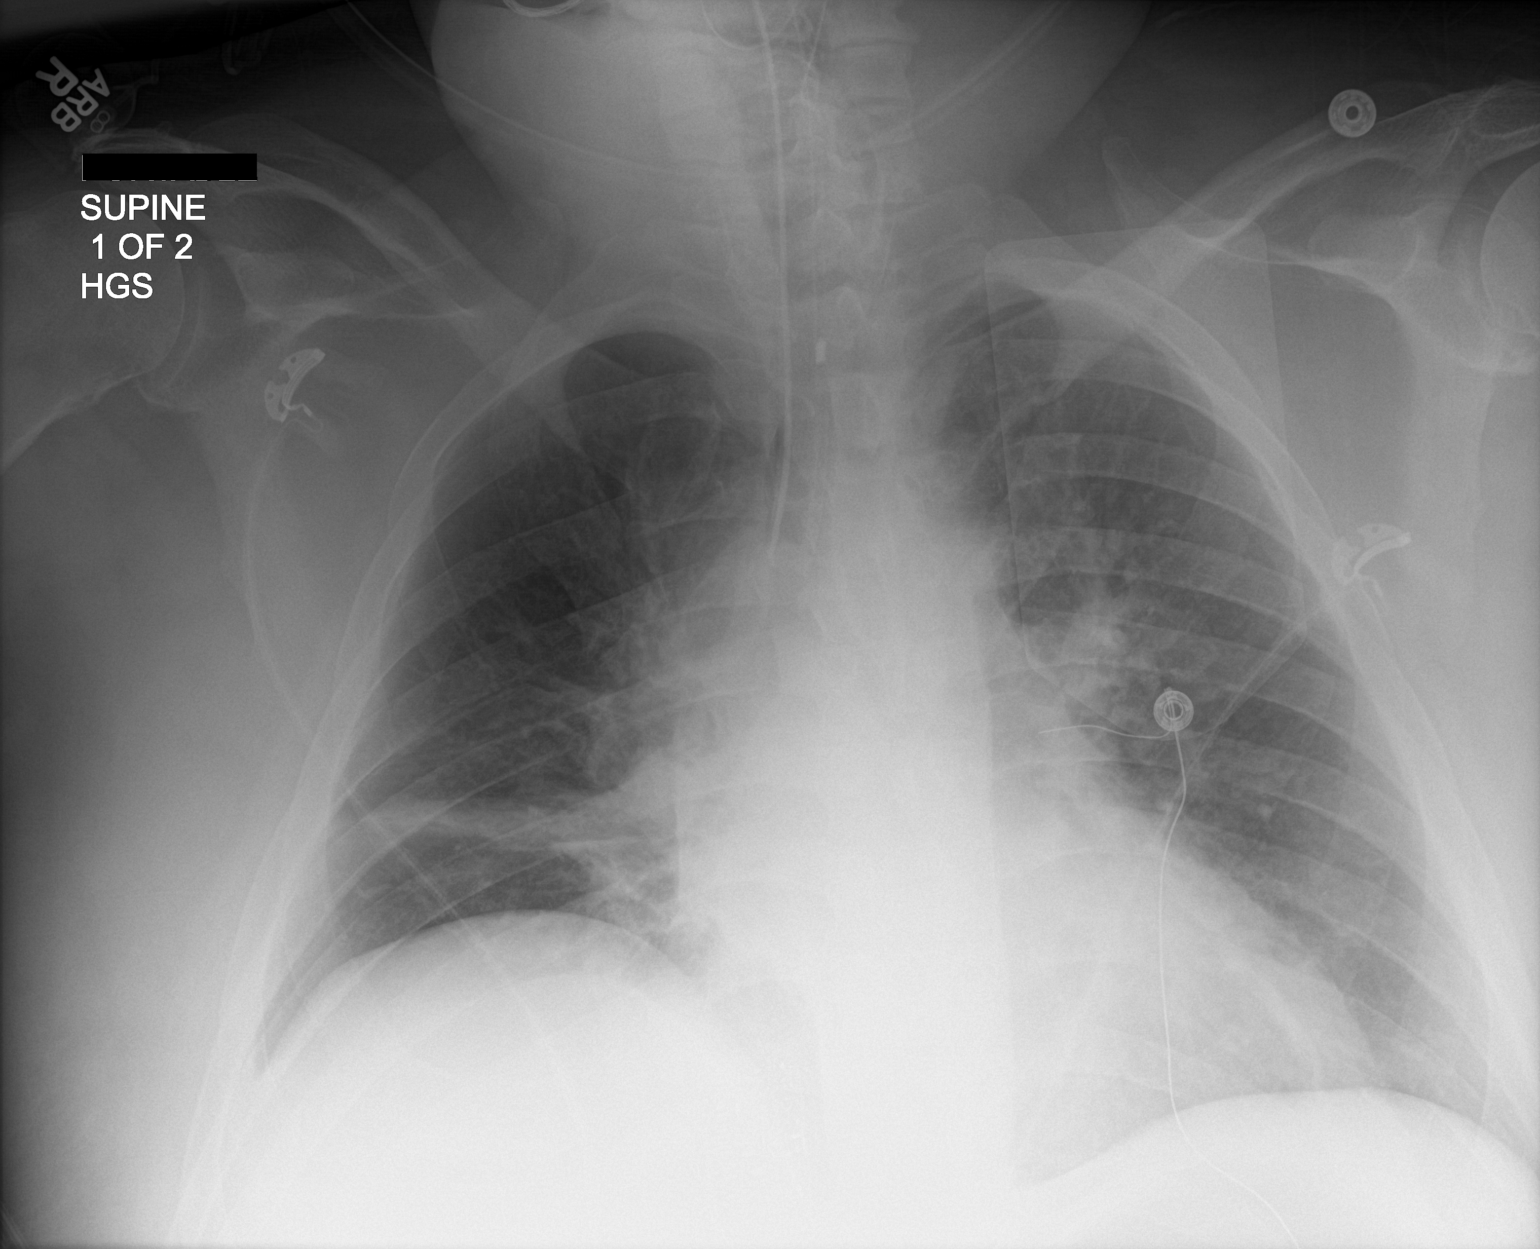

[2 of 2 positions shown; findings below may reference images not displayed]

FINDINGS: Endotracheal tube appears well positioned with tip approximately 2
cm above the carina. There is cardiac enlargement, probably
accentuated by the supine patient positioning. There is central
pulmonary vascular congestion without frank pulmonary edema. Linear
opacity at the right lung base is most likely atelectasis.

There is elevation of the right hemidiaphragm. Osseous and soft
tissue structures about the chest are otherwise unremarkable.
IMPRESSION: 1. Endotracheal tube well positioned with tip just above the level
of the carina.
2. Cardiomegaly, likely accentuated by the supine patient
positioning.
3. Central pulmonary vascular congestion without frank pulmonary
edema.
4. Linear opacity at the right lung base, most likely atelectasis,
less likely pneumonia or aspiration.

## 2016-10-05 IMAGING — DX DG ABDOMEN 1V
1 series · 1 of 1 positions shown · non-contrast
Comparison: 03/08/2016.

CLINICAL DATA: Evaluate OG tube placement

EXAM:
ABDOMEN - 1 VIEW

[abdomen kub]
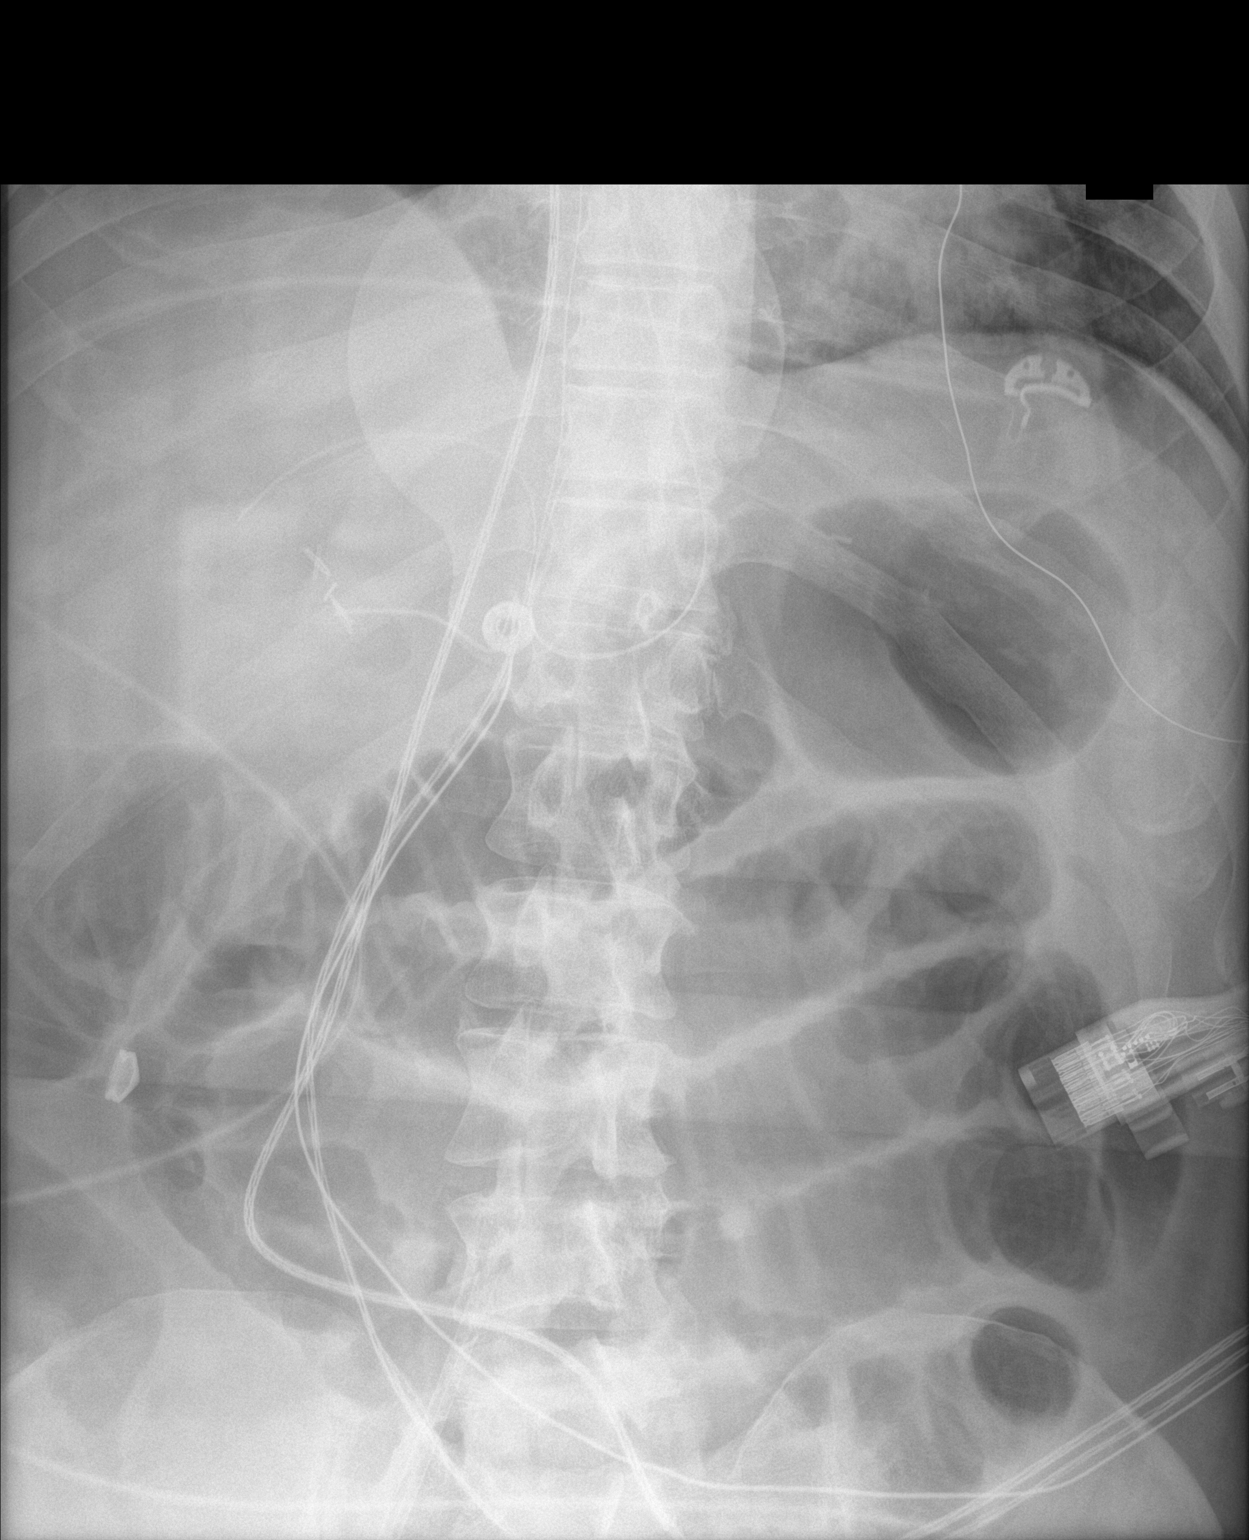

[1 of 1 positions shown; findings below may reference images not displayed]

FINDINGS: The nasogastric tube is looped within the distal esophagus and
proximal stomach. The tip of the tube is within the distal third of
the esophagus and is oriented cranially. Abnormal small bowel
dilatation is again noted and appears unchanged.
IMPRESSION: 1. The OG tube is looped within the distal esophagus and should be
retracted and then re-advanced such athat the tip is in the stomach.
These results will be called to the ordering clinician or
representative by the Radiologist Assistant, and communication
documented in the PACS or zVision Dashboard.

## 2016-10-05 IMAGING — DX DG ABDOMEN 1V
1 series · 1 of 1 positions shown · non-contrast
Comparison: None.

CLINICAL DATA: OG tube placement.

EXAM:
ABDOMEN - 1 VIEW

[abdomen kub]
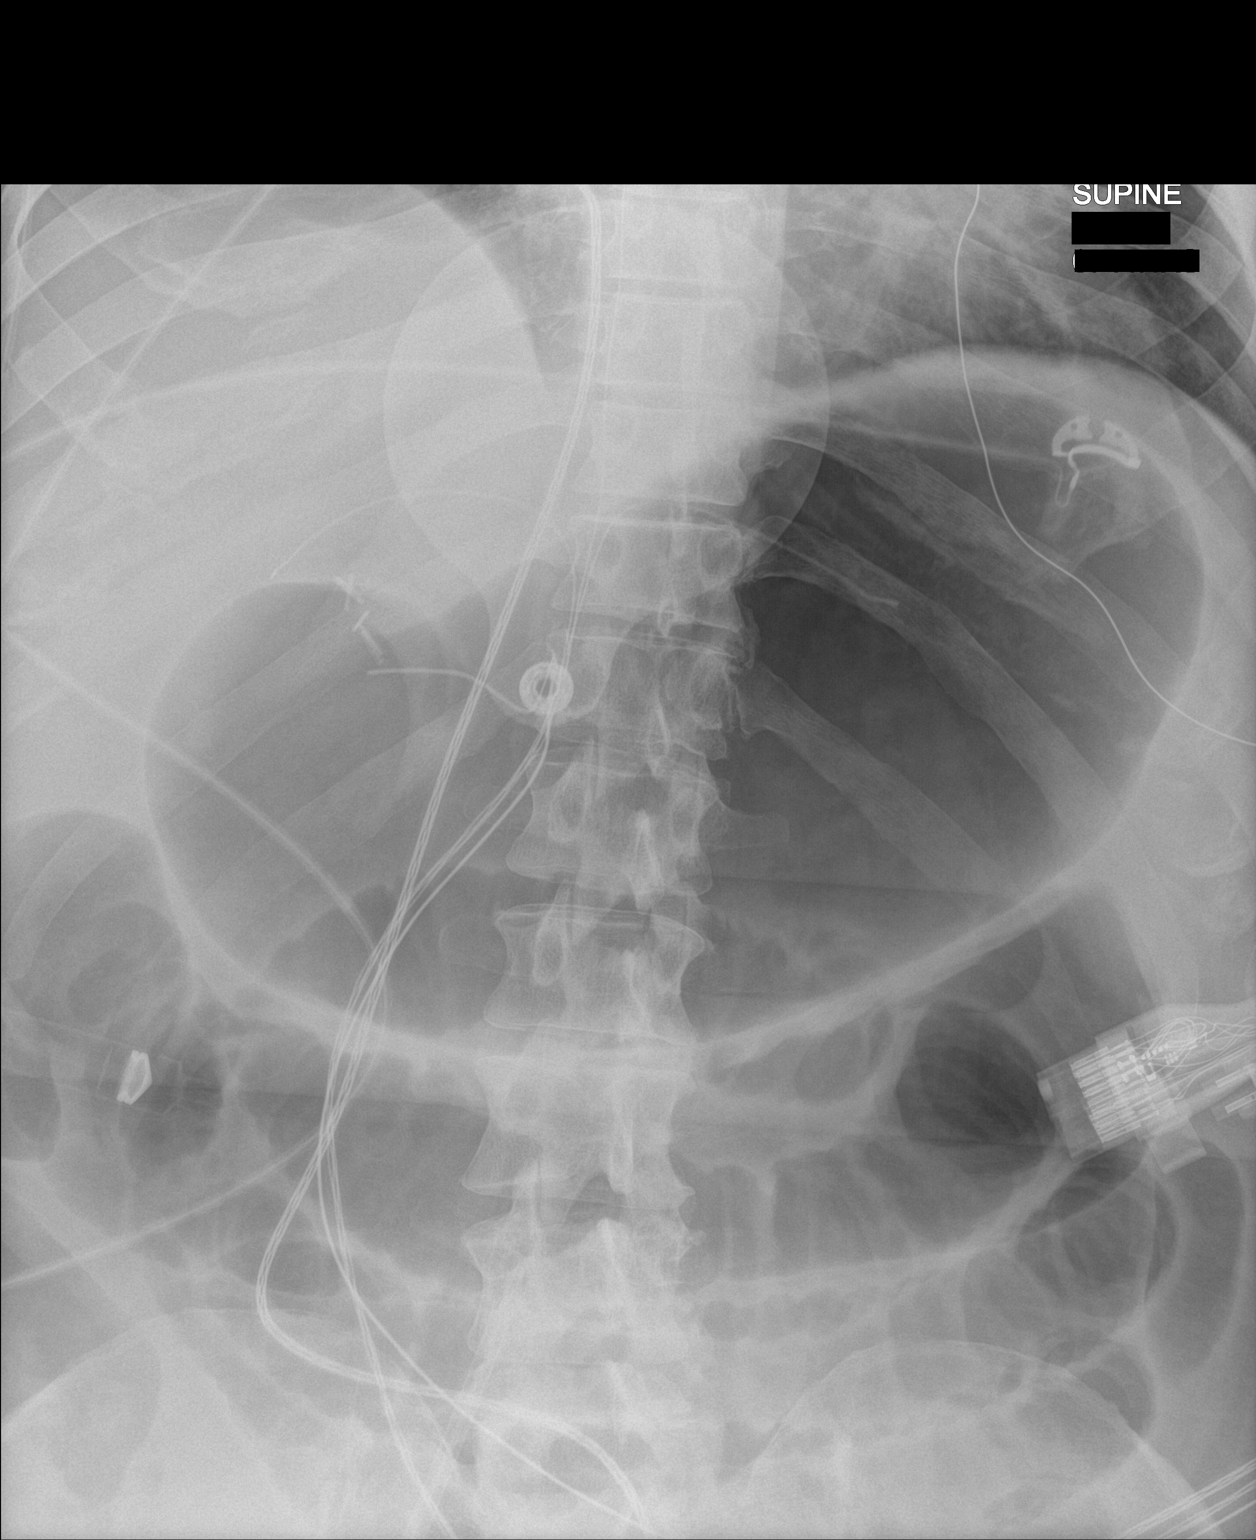

[1 of 1 positions shown; findings below may reference images not displayed]

FINDINGS: There is prominent gaseous distention of the stomach. There is
moderate gaseous distention of the visible portion of the abdominal
small bowel. Enteric tube is not appreciated on this exam
IMPRESSION: Enteric tube not seen. Prominent gaseous distention of the stomach
and at least moderate distention of the visible portion of the small
bowel.
